# Patient Record
Sex: Female | Born: 1955 | Race: Black or African American | Hispanic: No | Marital: Married | Smoking: Never smoker
Health system: Southern US, Community
[De-identification: ages and names within clinical notes are randomized; demographics above are authoritative.]

## PROBLEM LIST (undated history)

## (undated) DIAGNOSIS — E039 Hypothyroidism, unspecified: Secondary | ICD-10-CM

## (undated) DIAGNOSIS — D649 Anemia, unspecified: Secondary | ICD-10-CM

## (undated) DIAGNOSIS — N390 Urinary tract infection, site not specified: Secondary | ICD-10-CM

## (undated) DIAGNOSIS — E079 Disorder of thyroid, unspecified: Secondary | ICD-10-CM

## (undated) DIAGNOSIS — I10 Essential (primary) hypertension: Secondary | ICD-10-CM

## (undated) DIAGNOSIS — M1990 Unspecified osteoarthritis, unspecified site: Secondary | ICD-10-CM

## (undated) HISTORY — DX: Urinary tract infection, site not specified: N39.0

## (undated) HISTORY — DX: Disorder of thyroid, unspecified: E07.9

## (undated) HISTORY — DX: Anemia, unspecified: D64.9

## (undated) HISTORY — DX: Unspecified osteoarthritis, unspecified site: M19.90

## (undated) HISTORY — PX: PARTIAL HYSTERECTOMY: SHX80

## (undated) HISTORY — DX: Essential (primary) hypertension: I10

## (undated) HISTORY — PX: COLONOSCOPY: SHX174

---

## 1998-10-11 ENCOUNTER — Emergency Department (HOSPITAL_COMMUNITY): Admission: EM | Admit: 1998-10-11 | Discharge: 1998-10-11 | Payer: Self-pay | Admitting: *Deleted

## 2000-06-24 ENCOUNTER — Emergency Department (HOSPITAL_COMMUNITY): Admission: EM | Admit: 2000-06-24 | Discharge: 2000-06-25 | Payer: Self-pay | Admitting: *Deleted

## 2000-11-03 ENCOUNTER — Encounter (INDEPENDENT_AMBULATORY_CARE_PROVIDER_SITE_OTHER): Payer: Self-pay | Admitting: *Deleted

## 2000-11-03 LAB — CONVERTED CEMR LAB

## 2001-04-11 ENCOUNTER — Encounter: Admission: RE | Admit: 2001-04-11 | Discharge: 2001-04-11 | Payer: Self-pay | Admitting: Family Medicine

## 2002-07-20 ENCOUNTER — Encounter (INDEPENDENT_AMBULATORY_CARE_PROVIDER_SITE_OTHER): Payer: Self-pay | Admitting: Specialist

## 2002-07-20 ENCOUNTER — Ambulatory Visit (HOSPITAL_COMMUNITY): Admission: RE | Admit: 2002-07-20 | Discharge: 2002-07-20 | Payer: Self-pay | Admitting: Gastroenterology

## 2005-08-18 ENCOUNTER — Encounter: Admission: RE | Admit: 2005-08-18 | Discharge: 2005-08-18 | Payer: Self-pay | Admitting: Internal Medicine

## 2006-02-28 ENCOUNTER — Encounter: Admission: RE | Admit: 2006-02-28 | Discharge: 2006-02-28 | Payer: Self-pay | Admitting: Internal Medicine

## 2006-06-03 ENCOUNTER — Encounter (INDEPENDENT_AMBULATORY_CARE_PROVIDER_SITE_OTHER): Payer: Self-pay | Admitting: *Deleted

## 2007-08-20 ENCOUNTER — Emergency Department (HOSPITAL_COMMUNITY): Admission: EM | Admit: 2007-08-20 | Discharge: 2007-08-20 | Payer: Self-pay | Admitting: Emergency Medicine

## 2009-11-03 ENCOUNTER — Encounter: Admission: RE | Admit: 2009-11-03 | Discharge: 2009-11-03 | Payer: Self-pay | Admitting: Internal Medicine

## 2009-11-26 ENCOUNTER — Encounter: Admission: RE | Admit: 2009-11-26 | Discharge: 2009-11-26 | Payer: Self-pay | Admitting: Family Medicine

## 2009-11-28 ENCOUNTER — Emergency Department (HOSPITAL_COMMUNITY): Admission: EM | Admit: 2009-11-28 | Discharge: 2009-11-28 | Payer: Self-pay | Admitting: Emergency Medicine

## 2009-11-28 ENCOUNTER — Ambulatory Visit: Payer: Self-pay | Admitting: Vascular Surgery

## 2010-03-20 ENCOUNTER — Encounter
Admission: RE | Admit: 2010-03-20 | Discharge: 2010-03-20 | Payer: Self-pay | Source: Home / Self Care | Attending: Internal Medicine | Admitting: Internal Medicine

## 2010-04-02 ENCOUNTER — Encounter
Admission: RE | Admit: 2010-04-02 | Discharge: 2010-04-02 | Payer: Self-pay | Source: Home / Self Care | Attending: Internal Medicine | Admitting: Internal Medicine

## 2010-04-26 ENCOUNTER — Encounter: Payer: Self-pay | Admitting: Internal Medicine

## 2010-08-21 NOTE — Op Note (Signed)
Sylvia Conner, MADERA                           ACCOUNT NO.:  [id number]   MEDICAL RECORD NO.:  [id number]                   PATIENT TYPE:  AMB   LOCATION:  ENDO                                 FACILITY:  [facility]   PHYSICIAN:  Bernette Redbird, M.D.                DATE OF BIRTH:  15-Apr-1955   DATE OF PROCEDURE:  07/20/2002  DATE OF DISCHARGE:                                 OPERATIVE REPORT   PROCEDURE:  Colonoscopy with biopsies.   INDICATION:  This a 55 year old African-American female, who had heme-  positive stool in association with some small volume hematochezia and a  palpable nodule on digital exam in the distal rectum.   FINDINGS:  Probable prolapsed internal hemorrhoid.  Possible hypertrophied  anal papilla.  Diminutive colonic polyp.  Scattered diverticulosis.   DESCRIPTION OF PROCEDURE:  The nature, purpose, and risks of the procedure  had been discussed with the patient who provided written consent.  Sedation  was fentanyl 112.5 mcg and Versed 9 mg IV without arrhythmias or  desaturation.  The Olympus adjustable-tension pediatric video colonoscope  was advanced with slight difficulty through a somewhat angulated sigmoid  region and then with external abdominal compression to control looping, we  were able to reach the cecum, as identified by clear visualization of the  appendiceal orifice.  Pullback was then performed.  The quality of the prep  was quite good, and it is felt that all areas were adequately seen.   On the way in, at about 60 cm, felt to probably be the proximal descending  colon, I encountered a sessile 3 mm polyp removed by a couple of cold  biopsies.  No other polyps were seen, and there was no evidence of cancer,  colitis, or vascular ectasia.   There were some scattered diverticulosis around the colon.   Retroflexed viewing in the rectum showed a prolapsed, smooth-covered,  roughly 1 cm, pedunculated nodule, felt to probably be a prolapsed external  hemorrhoid, although it could also be a hypertrophied anal papilla.  No  evidence of cancer or colonic polyps was seen during this exam.   The patient tolerated this procedure well, and there were no apparent  complications.   IMPRESSION:  1. Diminutive colonic polyp removed by cold biopsy, as described above.  2. Palpable lesion in the distal rectum corresponds to what appears to be a     hypertrophied anal papilla or perhaps an atypical internally prolapsed     external hemorrhoid.  It clearly is originating from distal to the     dentate line.  3. Scattered diverticulosis.    PLAN:  1. Await pathology on today's biopsies of the polyp.  2. No specific treatment for the rectal nodule is felt to be needed but if     desired, surgical excision under local anesthesia could probably be     accomplished.  Bernette Redbird, M.D.    RB/MEDQ  D:  07/20/2002  T:  07/20/2002  Job:  [id number]   cc:   Antony Madura, M.D.  [address], [address]  [place]  [place] [address]  Fax: [phone]

## 2010-12-30 LAB — POCT I-STAT, CHEM 8
BUN: 16
Calcium, Ion: 1.13
Creatinine, Ser: 1.1
Glucose, Bld: 134 — ABNORMAL HIGH
HCT: 39
Hemoglobin: 13.3
TCO2: 28

## 2010-12-30 LAB — DIFFERENTIAL
Basophils Absolute: 0
Basophils Relative: 1
Lymphocytes Relative: 38
Monocytes Relative: 6
Neutro Abs: 2.8
Neutrophils Relative %: 53

## 2010-12-30 LAB — CBC
Hemoglobin: 11.5 — ABNORMAL LOW
RBC: 4.67

## 2011-05-20 ENCOUNTER — Encounter: Payer: Self-pay | Admitting: Internal Medicine

## 2011-05-24 ENCOUNTER — Encounter: Payer: Self-pay | Admitting: Internal Medicine

## 2011-05-24 ENCOUNTER — Other Ambulatory Visit (INDEPENDENT_AMBULATORY_CARE_PROVIDER_SITE_OTHER): Payer: PRIVATE HEALTH INSURANCE

## 2011-05-24 ENCOUNTER — Ambulatory Visit (INDEPENDENT_AMBULATORY_CARE_PROVIDER_SITE_OTHER): Payer: PRIVATE HEALTH INSURANCE | Admitting: Internal Medicine

## 2011-05-24 DIAGNOSIS — R141 Gas pain: Secondary | ICD-10-CM

## 2011-05-24 DIAGNOSIS — R1032 Left lower quadrant pain: Secondary | ICD-10-CM

## 2011-05-24 DIAGNOSIS — Z139 Encounter for screening, unspecified: Secondary | ICD-10-CM

## 2011-05-24 DIAGNOSIS — R143 Flatulence: Secondary | ICD-10-CM

## 2011-05-24 DIAGNOSIS — R109 Unspecified abdominal pain: Secondary | ICD-10-CM

## 2011-05-24 DIAGNOSIS — R142 Eructation: Secondary | ICD-10-CM

## 2011-05-24 DIAGNOSIS — E039 Hypothyroidism, unspecified: Secondary | ICD-10-CM | POA: Insufficient documentation

## 2011-05-24 DIAGNOSIS — Z90710 Acquired absence of both cervix and uterus: Secondary | ICD-10-CM | POA: Insufficient documentation

## 2011-05-24 DIAGNOSIS — I10 Essential (primary) hypertension: Secondary | ICD-10-CM | POA: Insufficient documentation

## 2011-05-24 DIAGNOSIS — E119 Type 2 diabetes mellitus without complications: Secondary | ICD-10-CM | POA: Insufficient documentation

## 2011-05-24 DIAGNOSIS — R140 Abdominal distension (gaseous): Secondary | ICD-10-CM

## 2011-05-24 LAB — CBC WITH DIFFERENTIAL/PLATELET
Basophils Absolute: 0.1 10*3/uL (ref 0.0–0.1)
Basophils Relative: 0.9 % (ref 0.0–3.0)
HCT: 36.7 % (ref 36.0–46.0)
Lymphs Abs: 2.1 10*3/uL (ref 0.7–4.0)
Monocytes Absolute: 0.3 10*3/uL (ref 0.1–1.0)
Neutro Abs: 3.2 10*3/uL (ref 1.4–7.7)
RBC: 4.8 Mil/uL (ref 3.87–5.11)
WBC: 5.8 10*3/uL (ref 4.5–10.5)

## 2011-05-24 LAB — COMPREHENSIVE METABOLIC PANEL
AST: 21 U/L (ref 0–37)
Albumin: 4 g/dL (ref 3.5–5.2)
Alkaline Phosphatase: 72 U/L (ref 39–117)
Calcium: 9.2 mg/dL (ref 8.4–10.5)
Potassium: 3.8 mEq/L (ref 3.5–5.1)
Sodium: 135 mEq/L (ref 135–145)

## 2011-05-24 LAB — URINALYSIS WITH CULTURE, IF INDICATED
Ketones, ur: NEGATIVE
Leukocytes, UA: NEGATIVE

## 2011-05-24 MED ORDER — HYOSCYAMINE SULFATE 0.125 MG SL SUBL: 0.1250 mg | SUBLINGUAL_TABLET | SUBLINGUAL | Status: AC | PRN

## 2011-05-24 MED ORDER — PEG-KCL-NACL-NASULF-NA ASC-C 100 G PO SOLR: 1.0000 | Freq: Once | ORAL | Status: DC

## 2011-05-24 MED ORDER — ALIGN 4 MG PO CAPS: 1.0000 | ORAL_CAPSULE | Freq: Every day | ORAL | Status: DC

## 2011-05-24 NOTE — Progress Notes (Signed)
Subjective:    Patient ID: Sylvia Conner, female    DOB: 13-Jan-1956, 56 y.o.   MRN: [id number]  HPI Sylvia Conner is a 56 yo female with PMH of HTN, DM who is seen for evaluation of LLQ abdominal pain and abdominal bloating.  The patient reports ongoing left lower corner pain over the past month. She reports this pain is worse in the morning, and is usually present when she wakes up.  The pain does seem to get better slightly over the course of the day. She does not associate this with eating or bowel movement necessarily. She reports her bowel movements are now formed, occurring once every other day. This is normal for her, however 2 weeks ago she reports 2 days of significant diarrhea. She reports having seen her PCP around this time and was felt to have a viral enteritis. She denies blood in her stool and melena. No nausea or vomiting. No heartburn. No dysphagia. Appetite has been good, but she does note a fluctuating weight. She reports about a 10 pound weight loss however she's gained back most of his weight at this point. She denies fevers but does note some chills and occasional night sweats. She reports significant abdominal bloating and "heaviness". This also is been going on for about a month and is worse after eating. She denies dysuria or hematuria, but notes on one occasion in the past week seeing what appeared to be pink tinged fluid on wiping. This was after urinating only. She reports a history of hysterectomy and reports having not seen any blood per vagina in many years. She does report that her ovaries are intact.  She also reports a colonoscopy  "7 or 8 years ago" 4 history rectal bleeding. She reports being diagnosed with hemorrhoids and skin tags. She also recalls one polyp removed but she does not know the nature of this polyp.  Review of Systems Constitutional: See HPI HEENT: Negative for sore throat, mouth sores and trouble swallowing. Eyes: Negative for visual  disturbance Respiratory: Negative for cough, chest tightness and shortness of breath Cardiovascular: Negative for chest pain, palpitations and lower extremity swelling Gastrointestinal: See history of present illness Genitourinary: Negative for dysuria and hematuria. Musculoskeletal: Negative for back pain, arthralgias and myalgias Skin: Negative for rash or color change Neurological: Positive for headaches, negative for weakness, numbness Hematological: Negative for adenopathy, negative for easy bruising/bleeding Psychiatric/behavioral: Negative for depressed mood, negative for anxiety   Past Medical History  Diagnosis Date  . Arthritis   . Diabetes mellitus   . Hypertension   . UTI (lower urinary tract infection)    Past Surgical History  Procedure Date  . Partial hysterectomy     1995   Family History  Problem Relation Age of Onset  . Diabetes    --Neg for CRC  Social History  . Marital Status: Married   Social History Main Topics  . Smoking status: Never Smoker   . Smokeless tobacco: Never Used  . Alcohol Use: No  . Drug Use: No      Objective:   Physical Exam BP 104/74  Pulse 66  Ht 5\' 3"  (1.6 m)  Wt 169 lb (76.658 kg)  BMI 29.94 kg/m2 Constitutional: Well-developed and well-nourished. No distress. HEENT: Normocephalic and atraumatic. Oropharynx is clear and moist. No oropharyngeal exudate. Conjunctivae are normal. Pupils are equal round and reactive to light. No scleral icterus. Neck: Neck supple. Trachea midline. Cardiovascular: Normal rate, regular rhythm and intact distal pulses. No M/R/G Pulmonary/chest:  Effort normal and breath sounds normal. No wheezing, rales or rhonchi. Abdominal: Soft, very mild left lower quadrant tenderness to deep palpation without rebound or guarding, nondistended. Bowel sounds active throughout. There are no masses palpable. No hepatosplenomegaly. Extremities: no clubbing, cyanosis, or edema Lymphadenopathy: No cervical  adenopathy noted. Neurological: Alert and oriented to person place and time. Skin: Skin is warm and dry. No rashes noted. Psychiatric: Normal mood and affect. Behavior is normal.  COMPLETE ABDOMINAL ULTRASOUND 11/03/2009    Comparison:  None.    Findings:    Gallbladder:  No gallstones are seen.  There is no pain over the gallbladder.  There is a small hypoechoic structure either within the gallbladder wall or within the adjacent liver of 5 x 4 x 6 mm which has been benign ultrasound characteristics.    Common bile duct:  The common bile duct is normal measuring 3.3 mm in diameter.    Liver:  The liver is diffusely echogenic consistent with fatty infiltration.  No focal abnormality is seen other than the area questioned adjacent to the gallbladder as noted above.    IVC:  Appears normal.    Pancreas:  No focal abnormality seen.    Spleen:  The spleen is not well seen but measures 2.8 cm sagittally.    Right Kidney:  No hydronephrosis is seen.  The right kidney measures 10.3 cm sagittally.    Left Kidney:  The left kidney is partially obscured by bowel gas. No hydronephrosis is seen.  The left kidney measures 10.8 cm.    Abdominal aorta:  The abdominal aorta is normal in caliber.    IMPRESSION:    1.  Fatty infiltration of the liver. 2.  No gallstones. 3.  No hydronephrosis.  The left kidney is partially obscured by bowel gas.     Assessment & Plan:  56 yo female with PMH of HTN, DM who is seen for evaluation of LLQ abdominal pain and abdominal bloating  1. LLQ pain/abd bloating -- at this point etiology the patient's symptoms is unclear. It does seem possible that she had a viral gastroenteritis 2 weeks ago manifested by diarrhea. 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 We will perform abdominal/pelvic CT scan with contrast to better evaluate her abdominal pain. If this is  unremarkable we will proceed with colonoscopy. Colonoscopy will be performed either way, for screening purposes and a history of polyp of unknown nature.  Given her reported history of possible blood in her urine, I will perform a UA with culture.  Also CMP and CBC.  Finally I will give her an anti-bloating diet to try to help with her symptoms, and I have recommended Align one capsule daily. A prescription is given for Levsin to be used as needed and as directed for pain/spasm.  Followup will be after imaging and procedure

## 2011-05-24 NOTE — Patient Instructions (Signed)
You have been scheduled for a colonoscopy with propofol. Please follow written instructions given to you at your visit today.  Please pick up your prep kit at the pharmacy within the next 1-3 days.  You have been scheduled for a CT scan of the abdomen and pelvis at [facility] ([address]---this is in the same building as [facility]ural [facility]).   You are scheduled on 05/25/2011 at 9:00am. You should arrive 15 minutes prior to your appointment time for registration. Please follow the written instructions below on the day of your exam:  WARNING: IF YOU ARE ALLERGIC TO IODINE/X-RAY DYE, PLEASE NOTIFY RADIOLOGY IMMEDIATELY AT [phone]! YOU WILL BE GIVEN A 13 HOUR PREMEDICATION PREP.  1) Do not eat or drink anything after 5:00am (4 hours prior to your test) 2) You have been given 2 bottles of oral contrast to drink. The solution may taste better if refrigerated, but do NOT add ice or any other liquid to this solution. Shake well before drinking.    Drink 1 bottle of contrast @ 7:00am (2 hours prior to your exam)  Drink 1 bottle of contrast @ 8:00am (1 hour prior to your exam)  You may take any medications as prescribed with a small amount of water except for the following: Metformin, Glucophage, Glucovance, Avandamet, Riomet, Fortamet, Actoplus Met, Janumet, Glumetza or Metaglip. The above medications must be held the day of the exam AND 48 hours after the exam.  The purpose of you drinking the oral contrast is to aid in the visualization of your intestinal tract. The contrast solution may cause some diarrhea. Before your exam is started, you will be given a small amount of fluid to drink. Depending on your individual set of symptoms, you may also receive an intravenous injection of x-ray contrast/dye. Plan on being at [facility] for 30 minutes or long, depending on the type of exam you are having performed.  If you have any questions regarding your exam or if you need to  reschedule, you may call the CT department at [phone] between the hours of 8:00 am and 5:00 pm, Monday-Friday.  Your physician has requested that you go to the basement for lab work before leaving today  We have sent the following medications to your pharmacy for you to pick up at your convenience:levsin, align take as directed  You have been given an anti-bloating diet       ________________________________________________________________________  2

## 2011-05-25 ENCOUNTER — Telehealth: Payer: Self-pay | Admitting: *Deleted

## 2011-05-25 ENCOUNTER — Ambulatory Visit (INDEPENDENT_AMBULATORY_CARE_PROVIDER_SITE_OTHER)
Admission: RE | Admit: 2011-05-25 | Discharge: 2011-05-25 | Disposition: A | Discharge status: Home / Self Care | Payer: PRIVATE HEALTH INSURANCE | Source: Ambulatory Visit | Attending: Internal Medicine | Admitting: Internal Medicine

## 2011-05-25 ENCOUNTER — Ambulatory Visit (INDEPENDENT_AMBULATORY_CARE_PROVIDER_SITE_OTHER): Payer: PRIVATE HEALTH INSURANCE | Admitting: Family Medicine

## 2011-05-25 VITALS — BP 119/76 | HR 79 | Temp 99.1°F | Resp 16 | Ht 63.0 in | Wt 164.0 lb

## 2011-05-25 DIAGNOSIS — R319 Hematuria, unspecified: Secondary | ICD-10-CM

## 2011-05-25 DIAGNOSIS — R109 Unspecified abdominal pain: Secondary | ICD-10-CM

## 2011-05-25 DIAGNOSIS — Z139 Encounter for screening, unspecified: Secondary | ICD-10-CM

## 2011-05-25 DIAGNOSIS — R300 Dysuria: Secondary | ICD-10-CM

## 2011-05-25 LAB — POCT UA - MICROSCOPIC ONLY
Casts, Ur, LPF, POC: NEGATIVE
Crystals, Ur, HPF, POC: NEGATIVE
Yeast, UA: NEGATIVE

## 2011-05-25 LAB — POCT URINALYSIS DIPSTICK
Bilirubin, UA: NEGATIVE
Glucose, UA: NEGATIVE
Leukocytes, UA: NEGATIVE
Spec Grav, UA: 1.02

## 2011-05-25 MED ORDER — CIPROFLOXACIN HCL 500 MG PO TABS: 500.0000 mg | ORAL_TABLET | Freq: Two times a day (BID) | ORAL | Status: DC

## 2011-05-25 MED ORDER — IOHEXOL 300 MG/ML  SOLN
80.0000 mL | Freq: Once | INTRAMUSCULAR | Status: AC | PRN
  Administered 2011-05-25: 100 mL via INTRAVENOUS

## 2011-05-25 NOTE — Telephone Encounter (Signed)
Received a call from Erskine Squibb at [facility] who wanted to fax results of CT scan to [place]. Showed report to Dr Rhea Belton who had already seen the results and tried to call pt with no luck. I tried cell # which belonged to someone else; she was off from work today, so I left word on her answering machine. Pt has a cyst like component on her r ovary that needs probably a transvaginal U/S; Dr Rhea Belton would like her GYN to order and f/u with. Her Left side is ok, and it;s possible the r side can be causing her LLQ pain.

## 2011-05-25 NOTE — Progress Notes (Signed)
Subjective:    Patient ID: Sylvia Conner, female    DOB: Jul 07, 1955, 56 y.o.   MRN: [id number]  Hematuria The current episode started in the past 7 days. The problem has been gradually worsening since onset. She describes the hematuria as gross hematuria. The hematuria occurs throughout her entire urinary stream. She reports no clotting in her urine stream. Her pain is at a severity of 3/10. The pain is mild. She describes her urine color as light pink. Irritative symptoms include frequency. Irritative symptoms do not include urgency. Associated symptoms include chills, dysuria and flank pain. Pertinent negatives include no fever or inability to urinate. There is no history of kidney stones.  Dysuria  Associated symptoms include chills, flank pain, frequency and hematuria. Pertinent negatives include no urgency. There is no history of kidney stones.   Works at [employer]   Review of Systems  Constitutional: Positive for chills. Negative for fever.  Genitourinary: Positive for dysuria, frequency, hematuria and flank pain. Negative for urgency.       Objective:   Physical Exam  No distress No CVAT, No abd tenderness  Results for orders placed in visit on 05/24/11  CBC WITH DIFFERENTIAL      Component Value Range   WBC 5.8  4.5 - 10.5 (K/uL)   RBC 4.80  3.87 - 5.11 (Mil/uL)   Hemoglobin 11.9 (*) 12.0 - 15.0 (g/dL)   HCT 40.9  81.1 - 91.4 (%)   MCV 76.5 (*) 78.0 - 100.0 (fl)   MCHC 32.4  30.0 - 36.0 (g/dL)   RDW 78.2  95.6 - 21.3 (%)   Platelets 287.0  150.0 - 400.0 (K/uL)   Neutrophils Relative 55.4  43.0 - 77.0 (%)   Lymphocytes Relative 36.0  12.0 - 46.0 (%)   Monocytes Relative 6.0  3.0 - 12.0 (%)   Eosinophils Relative 1.7  0.0 - 5.0 (%)   Basophils Relative 0.9  0.0 - 3.0 (%)   Neutro Abs 3.2  1.4 - 7.7 (K/uL)   Lymphs Abs 2.1  0.7 - 4.0 (K/uL)   Monocytes Absolute 0.3  0.1 - 1.0 (K/uL)   Eosinophils Absolute 0.1  0.0 - 0.7 (K/uL)   Basophils Absolute 0.1   0.0 - 0.1 (K/uL)  COMPREHENSIVE METABOLIC PANEL      Component Value Range   Sodium 135  135 - 145 (mEq/L)   Potassium 3.8  3.5 - 5.1 (mEq/L)   Chloride 101  96 - 112 (mEq/L)   CO2 28  19 - 32 (mEq/L)   Glucose, Bld 135 (*) 70 - 99 (mg/dL)   BUN 11  6 - 23 (mg/dL)   Creatinine, Ser 0.7  0.4 - 1.2 (mg/dL)   Total Bilirubin 0.9  0.3 - 1.2 (mg/dL)   Alkaline Phosphatase 72  39 - 117 (U/L)   AST 21  0 - 37 (U/L)   ALT 14  0 - 35 (U/L)   Total Protein 7.7  6.0 - 8.3 (g/dL)   Albumin 4.0  3.5 - 5.2 (g/dL)   Calcium 9.2  8.4 - 08.6 (mg/dL)   GFR 578.46  >96.29 (mL/min)  URINALYSIS WITH CULTURE, IF INDICATED      Component Value Range   Ketones, ur NEGATIVE  Negative    Leukocytes, UA NEGATIVE  Negative    Nitrite NEGATIVE  Negative    pH 6.5  5.0 - 8.0    Specific Gravity, Urine 1.010  1.000-1.030    Urine Glucose 250  Negative  Total Protein, Urine NEGATIVE  Negative    Urobilinogen, UA 0.2  0.0 - 1.0       Assessment & Plan:  Hematuria c/w UTI   Plan:   Check urine culture Cipro 500 mg bid x 3 days

## 2011-05-25 NOTE — Telephone Encounter (Signed)
Notified pt she needs an appt with GYN . Pt reports she hasn't had a PAP smear in about 3 years. She was OK with me scheduling her. Scheduled pt with Dr Leda Quail on 06/09/11 at 0915am. She will f/u with Dr Rhea Belton and her COLON on 05/31/11. Faxed info to [phone].

## 2011-05-27 LAB — URINE CULTURE

## 2011-05-31 ENCOUNTER — Ambulatory Visit (AMBULATORY_SURGERY_CENTER): Payer: PRIVATE HEALTH INSURANCE | Admitting: Internal Medicine

## 2011-05-31 ENCOUNTER — Encounter: Payer: Self-pay | Admitting: Internal Medicine

## 2011-05-31 VITALS — BP 139/78 | HR 63 | Temp 97.1°F | Resp 18 | Ht 63.0 in | Wt 169.0 lb

## 2011-05-31 DIAGNOSIS — D128 Benign neoplasm of rectum: Secondary | ICD-10-CM

## 2011-05-31 DIAGNOSIS — Z1211 Encounter for screening for malignant neoplasm of colon: Secondary | ICD-10-CM

## 2011-05-31 DIAGNOSIS — D129 Benign neoplasm of anus and anal canal: Secondary | ICD-10-CM

## 2011-05-31 DIAGNOSIS — D126 Benign neoplasm of colon, unspecified: Secondary | ICD-10-CM

## 2011-05-31 MED ORDER — SODIUM CHLORIDE 0.9 % IV SOLN: 500.0000 mL | INTRAVENOUS | Status: DC

## 2011-05-31 NOTE — Op Note (Signed)
[facility] [address] [place], [place]  [address]  COLONOSCOPY PROCEDURE REPORT  PATIENT:  Sylvia, Conner  MR#:  [id number] BIRTHDATE:  05-21-1955, 55 yrs. old  GENDER:  female ENDOSCOPIST:  Carie Caddy. Soloman Mckeithan, MD  PROCEDURE DATE:  05/31/2011 PROCEDURE:  Colonoscopy with snare polypectomy ASA CLASS:  Class II INDICATIONS:  Abdominal pain, Screening, 1st colonoscopy MEDICATIONS:   These medications were titrated to patient response per physician's verbal order, Versed 10 mg IV, Fentanyl 100 mcg IV  DESCRIPTION OF PROCEDURE:   After the risks benefits and alternatives of the procedure were thoroughly explained, informed consent was obtained.  Digital rectal exam was performed and revealed no rectal masses and perianal skin tags.   The LB [id number]-[id number] endoscope was introduced through the anus and advanced to the cecum, which was identified by both the appendix and ileocecal valve, without limitations.  The quality of the prep was Moviprep fair.  The instrument was then slowly withdrawn as the colon was fully examined. <<PROCEDUREIMAGES>> FINDINGS:  A 5 mm sessile polyp was found in the cecum. Polyp was snared without cautery. Retrieval was successful.  Mild diverticulosis was found  scattered throughout the colon. Retroflexed views in the rectum revealed no abnormalities.   The scope was then withdrawn from the cecum and the procedure completed.  COMPLICATIONS:  None  ENDOSCOPIC IMPRESSION: 1) Sessile polyp in the cecum. Removed and sent to pathology. 2) Mild diverticulosis found scattered throught the colon  RECOMMENDATIONS: 1) Await pathology results 2) High fiber diet. 3) If the polyp removed today is proven to be an adenomatous (pre-cancerous) polyp, you will need a repeat colonoscopy in 5 years. Otherwise you should continue to follow colorectal cancer screening guidelines for "routine risk" patients with colonoscopy in 10 years. You will receive a  letter within 1-2 weeks with the results of your biopsy as well as final recommendations. Please call my office if you have not received a letter after 3 weeks.  Carie Caddy. Rhea Belton, MD  CC:  Burton Apley, MD The Patient  n. eSIGNED:   Carie Caddy. Greidy Sherard at 05/31/2011 02:02 PM  [place], [place], [phone]

## 2011-05-31 NOTE — Progress Notes (Signed)
Patient did not experience any of the following events: a burn prior to discharge; a fall within the facility; wrong site/side/patient/procedure/implant event; or a hospital transfer or hospital admission upon discharge from the facility. (G8907) Patient did not have preoperative order for IV antibiotic SSI prophylaxis. (G8918)  

## 2011-05-31 NOTE — Patient Instructions (Addendum)
YOU HAD AN ENDOSCOPIC PROCEDURE TODAY AT THE [facility]: Refer to the procedure report that was given to you for any specific questions about what was found during the examination.  If the procedure report does not answer your questions, please call your gastroenterologist to clarify.  If you requested that your care partner not be given the details of your procedure findings, then the procedure report has been included in a sealed envelope for you to review at your convenience later.  YOU SHOULD EXPECT: Some feelings of bloating in the abdomen. Passage of more gas than usual.  Walking can help get rid of the air that was put into your GI tract during the procedure and reduce the bloating. If you had a lower endoscopy (such as a colonoscopy or flexible sigmoidoscopy) you may notice spotting of blood in your stool or on the toilet paper. If you underwent a bowel prep for your procedure, then you may not have a normal bowel movement for a few days.  DIET: Your first meal following the procedure should be a light meal and then it is ok to progress to your normal diet.  A half-sandwich or bowl of soup is an example of a good first meal.  Heavy or fried foods are harder to digest and may make you feel nauseous or bloated.  Likewise meals heavy in dairy and vegetables can cause extra gas to form and this can also increase the bloating.  Drink plenty of fluids but you should avoid alcoholic beverages for 24 hours.  ACTIVITY: Your care partner should take you home directly after the procedure.  You should plan to take it easy, moving slowly for the rest of the day.  You can resume normal activity the day after the procedure however you should NOT DRIVE or use heavy machinery for 24 hours (because of the sedation medicines used during the test).    SYMPTOMS TO REPORT IMMEDIATELY: A gastroenterologist can be reached at any hour.  During normal business hours, 8:30 AM to 5:00 PM Monday through Friday,  call [phone].  After hours and on weekends, please call the GI answering service at [phone] who will take a message and have the physician on call contact you.   Following lower endoscopy (colonoscopy or flexible sigmoidoscopy):  Excessive amounts of blood in the stool  Significant tenderness or worsening of abdominal pains  Swelling of the abdomen that is new, acute  Fever of 100F or higher    FOLLOW UP: If any biopsies were taken you will be contacted by phone or by letter within the next 1-3 weeks.  Call your gastroenterologist if you have not heard about the biopsies in 3 weeks.  Our staff will call the home number listed on your records the next business day following your procedure to check on you and address any questions or concerns that you may have at that time regarding the information given to you following your procedure. This is a courtesy call and so if there is no answer at the home number and we have not heard from you through the emergency physician on call, we will assume that you have returned to your regular daily activities without incident.  SIGNATURES/CONFIDENTIALITY: You and/or your care partner have signed paperwork which will be entered into your electronic medical record.  These signatures attest to the fact that that the information above on your After Visit Summary has been reviewed and is understood.  Full responsibility of the confidentiality   of this discharge information lies with you and/or your care-partner.     

## 2011-06-01 ENCOUNTER — Telehealth: Payer: Self-pay | Admitting: *Deleted

## 2011-06-01 NOTE — Telephone Encounter (Signed)
  Follow up Call-  Call back number 05/31/2011  Post procedure Call Back phone  # [phone] ASK FOR PT. AND THEY PAGE HER  Permission to leave phone message No     No answer, do not have permission to leave message.

## 2011-06-07 ENCOUNTER — Encounter: Payer: Self-pay | Admitting: Internal Medicine

## 2011-08-17 ENCOUNTER — Telehealth: Payer: Self-pay | Admitting: Gastroenterology

## 2011-08-17 NOTE — Telephone Encounter (Signed)
lvm for pt to call us back regarding her No-showing her GYN appointment 3x

## 2012-05-26 ENCOUNTER — Ambulatory Visit: Payer: Self-pay

## 2012-10-03 ENCOUNTER — Other Ambulatory Visit: Payer: Self-pay

## 2012-10-03 DIAGNOSIS — Z1231 Encounter for screening mammogram for malignant neoplasm of breast: Secondary | ICD-10-CM

## 2012-10-09 ENCOUNTER — Emergency Department (HOSPITAL_COMMUNITY)
Admission: EM | Admit: 2012-10-09 | Discharge: 2012-10-09 | Disposition: A | Discharge status: Home / Self Care | Payer: PRIVATE HEALTH INSURANCE | Attending: Emergency Medicine | Admitting: Emergency Medicine

## 2012-10-09 ENCOUNTER — Encounter (HOSPITAL_COMMUNITY): Payer: Self-pay | Admitting: Emergency Medicine

## 2012-10-09 ENCOUNTER — Emergency Department (HOSPITAL_COMMUNITY): Payer: PRIVATE HEALTH INSURANCE

## 2012-10-09 DIAGNOSIS — R079 Chest pain, unspecified: Secondary | ICD-10-CM

## 2012-10-09 DIAGNOSIS — R002 Palpitations: Secondary | ICD-10-CM | POA: Insufficient documentation

## 2012-10-09 DIAGNOSIS — Z9889 Other specified postprocedural states: Secondary | ICD-10-CM | POA: Insufficient documentation

## 2012-10-09 DIAGNOSIS — I10 Essential (primary) hypertension: Secondary | ICD-10-CM | POA: Insufficient documentation

## 2012-10-09 DIAGNOSIS — R072 Precordial pain: Secondary | ICD-10-CM | POA: Insufficient documentation

## 2012-10-09 DIAGNOSIS — E079 Disorder of thyroid, unspecified: Secondary | ICD-10-CM | POA: Insufficient documentation

## 2012-10-09 DIAGNOSIS — Z862 Personal history of diseases of the blood and blood-forming organs and certain disorders involving the immune mechanism: Secondary | ICD-10-CM | POA: Insufficient documentation

## 2012-10-09 DIAGNOSIS — Z90710 Acquired absence of both cervix and uterus: Secondary | ICD-10-CM | POA: Insufficient documentation

## 2012-10-09 DIAGNOSIS — Z7982 Long term (current) use of aspirin: Secondary | ICD-10-CM | POA: Insufficient documentation

## 2012-10-09 DIAGNOSIS — M79609 Pain in unspecified limb: Secondary | ICD-10-CM | POA: Insufficient documentation

## 2012-10-09 DIAGNOSIS — E119 Type 2 diabetes mellitus without complications: Secondary | ICD-10-CM | POA: Insufficient documentation

## 2012-10-09 DIAGNOSIS — Z880 Allergy status to penicillin: Secondary | ICD-10-CM | POA: Insufficient documentation

## 2012-10-09 DIAGNOSIS — M129 Arthropathy, unspecified: Secondary | ICD-10-CM | POA: Insufficient documentation

## 2012-10-09 DIAGNOSIS — Z87440 Personal history of urinary (tract) infections: Secondary | ICD-10-CM | POA: Insufficient documentation

## 2012-10-09 DIAGNOSIS — Z79899 Other long term (current) drug therapy: Secondary | ICD-10-CM | POA: Insufficient documentation

## 2012-10-09 DIAGNOSIS — M549 Dorsalgia, unspecified: Secondary | ICD-10-CM | POA: Insufficient documentation

## 2012-10-09 LAB — COMPREHENSIVE METABOLIC PANEL
ALT: 44 U/L — ABNORMAL HIGH (ref 0–35)
Alkaline Phosphatase: 115 U/L (ref 39–117)
BUN: 14 mg/dL (ref 6–23)
CO2: 30 mEq/L (ref 19–32)
GFR calc Af Amer: >90 mL/min (ref >90)
GFR calc non Af Amer: >90 mL/min (ref >90)
Glucose, Bld: 268 mg/dL — ABNORMAL HIGH (ref 70–99)
Potassium: 3.9 mEq/L (ref 3.5–5.1)
Sodium: 136 mEq/L (ref 135–145)

## 2012-10-09 LAB — CBC WITH DIFFERENTIAL/PLATELET
Basophils Relative: 1 % (ref 0–1)
Eosinophils Absolute: 0.1 10*3/uL (ref 0.0–0.7)
HCT: 34.6 % — ABNORMAL LOW (ref 36.0–46.0)
Hemoglobin: 11.2 g/dL — ABNORMAL LOW (ref 12.0–15.0)
Lymphocytes Relative: 45 % (ref 12–46)
Lymphs Abs: 2.4 10*3/uL (ref 0.7–4.0)
MCHC: 32.4 g/dL (ref 30.0–36.0)
MCV: 74.2 fL — ABNORMAL LOW (ref 78.0–100.0)
Monocytes Relative: 6 % (ref 3–12)
Neutro Abs: 2.5 10*3/uL (ref 1.7–7.7)

## 2012-10-09 MED ORDER — SODIUM CHLORIDE 0.9 % IV SOLN
INTRAVENOUS | Status: DC
  Administered 2012-10-09: 18:00:00 via INTRAVENOUS

## 2012-10-09 MED ORDER — SODIUM CHLORIDE 0.9 % IV BOLUS (SEPSIS)
1000.0000 mL | Freq: Once | INTRAVENOUS | Status: AC
  Administered 2012-10-09: 1000 mL via INTRAVENOUS

## 2012-10-09 NOTE — ED Provider Notes (Signed)
Medical screening examination/treatment/procedure(s) were conducted as a shared visit with non-physician practitioner(s) and myself.  I personally evaluated the patient during the encounter  The patient seen by me. Patient with one and a half weeks of intermittent chest pain chest pain day or more than not. Currently no pain. That pain today. Patient has risk factors of diabetes and hypertension. No history of any chest pain or cardiac problems prior to this point. Initial EKG in the ED showed the trigeminy. It is now resolved monitor has no evidence of any premature ventricular contractions. Patient had no complaint of palpitations. Patient does have a primary care doctor. Patient's chest x-rays negative troponins negative EKG as mentioned above. No evidence of acute cardiac event. With the duration of the chest pain and the frequency of the chest pain troponin should be positive this is unstable angina or an MI. That has been ruled out. Patient will referred to cardiology for followup and asked her primary care Dr. for further workup.  Shelda Jakes, MD 10/09/12 2013

## 2012-10-09 NOTE — ED Notes (Signed)
Pt states that she has chest pain that radiates down both arms and to her back that has been constant for week and half now.  Pt states she has some dizziness with it.

## 2012-10-09 NOTE — ED Provider Notes (Signed)
History    CSN: [id number] Arrival date & time 10/09/12  1709  First MD Initiated Contact with Patient 10/09/12 1715     Chief Complaint  Patient presents with  . Chest Pain   (Consider location/radiation/quality/duration/timing/severity/associated sxs/prior Treatment) HPI Sylvia Conner is a 57 y.o.female with a significant PMH of *arthritis, diabetes, hypertension, UTI, anemia, thyroid disease, partial hysterectomy, colonoscopy presents to the ER with complaints of chest pain, back pain and bilateral arm pain for the past 1 and a half weeks, she currently has no pain.  She denies ever having chest pains before. Does not have a cardiologist. Pain starts substernal than radiates from right shoulder to left then into back. No prior Stents, MIs, CABGs. Says she had a stress test done many years ago as a screening by her PCP. No associated signs of Nausea, vomiting, diarrhea, SOB, wheezing, coughing or dizziness.Took two aspirin today. Currently having no pain. EKG shows trigeminy    Past Medical History  Diagnosis Date  . Arthritis   . Diabetes mellitus   . Hypertension   . UTI (lower urinary tract infection)   . Anemia   . Thyroid disease    Past Surgical History  Procedure Laterality Date  . Partial hysterectomy      1995  . Colonoscopy     Family History  Problem Relation Age of Onset  . Diabetes    . Hypertension Mother   . Diabetes Mother   . Diabetes Sister   . Diabetes Paternal Grandmother    History  Substance Use Topics  . Smoking status: Never Smoker   . Smokeless tobacco: Never Used  . Alcohol Use: No   OB History   Grav Para Term Preterm Abortions TAB SAB Ect Mult Living                 Review of Systems  Cardiovascular: Positive for chest pain and palpitations.  All other systems reviewed and are negative.    Allergies  Penicillins and Scallops  Home Medications   Current Outpatient Rx  Name  Route  Sig  Dispense  Refill  .  aspirin 325 MG tablet   Oral   Take 650 mg by mouth every morning.         Marland Kitchen levothyroxine (SYNTHROID, LEVOTHROID) 125 MCG tablet   Oral   Take 125 mcg by mouth every morning.          . metFORMIN (GLUCOPHAGE) 500 MG tablet   Oral   Take 500 mg by mouth 2 (two) times daily with a meal.         . triamterene-hydrochlorothiazide (MAXZIDE) 75-50 MG per tablet   Oral   Take 1 tablet by mouth every morning.           BP 123/76  Pulse 69  Resp 16  SpO2 98% Physical Exam  Nursing note and vitals reviewed. Constitutional: She appears well-developed and well-nourished. No distress.  HENT:  Head: Normocephalic and atraumatic.  Eyes: Pupils are equal, round, and reactive to light.  Neck: Normal range of motion. Neck supple.  Cardiovascular: Normal rate, regular rhythm and normal heart sounds.   Pulmonary/Chest: Effort normal.  Abdominal: Soft.  Musculoskeletal:       Arms: Neurological: She is alert.  Skin: Skin is warm and dry.    ED Course  Procedures (including critical care time) Labs Reviewed  CBC WITH DIFFERENTIAL - Abnormal; Notable for the following:    Hemoglobin 11.2 (*)  HCT 34.6 (*)    MCV 74.2 (*)    MCH 24.0 (*)    All other components within normal limits  COMPREHENSIVE METABOLIC PANEL - Abnormal; Notable for the following:    Glucose, Bld 268 (*)    AST 39 (*)    ALT 44 (*)    All other components within normal limits  LIPASE, BLOOD  TROPONIN I  POCT I-STAT TROPONIN I   Dg Chest 2 View  10/09/2012   *RADIOLOGY REPORT*  Clinical Data: Chest pain, shortness of breath.  CHEST - 2 VIEW  Comparison: None.  Findings: Heart and mediastinal contours are within normal limits. No focal opacities or effusions.  No acute bony abnormality.  IMPRESSION: No active cardiopulmonary disease.   Original Report Authenticated By: Charlett Nose, M.D.   1. Chest pain     MDM   Date: 10/09/2012  Rate: 94  Rhythm: sinus tachycardia  QRS Axis: normal  Intervals:  normal  ST/T Wave abnormalities: normal  Conduction Disutrbances:first-degree A-V block   Narrative Interpretation:  Ventricular trigeminy  Old EKG Reviewed: changes noted pt now in ventricular trigeminy    Patient has stayed pain free through out visit. Troponin is negative. Dr. Deretha Emory has seen patient and feels that she does need to be evaluated by cardiology but as her pain has been for 1.5 weeks, troponin is negative and she has been pain free today that she can be evaluated as an outpatient. She has significant risk factors and we have expressed the importance of her f/u with her PCP and cardiology. Very strict return to ED precautions given.  57 y.o.Sylvia Conner's evaluation in the Emergency Department is complete. It has been determined that no acute conditions requiring further emergency intervention are present at this time. The patient/guardian have been advised of the diagnosis and plan. We have discussed signs and symptoms that warrant return to the ED, such as changes or worsening in symptoms.  Vital signs are stable at discharge. Filed Vitals:   10/09/12 1832  BP: 123/76  Pulse: 69  Resp: 16    Patient/guardian has voiced understanding and agreed to follow-up with the PCP or specialist.     Dorthula Matas, PA-C 10/09/12 2006

## 2012-10-25 ENCOUNTER — Ambulatory Visit
Admission: RE | Admit: 2012-10-25 | Discharge: 2012-10-25 | Disposition: A | Discharge status: Home / Self Care | Payer: PRIVATE HEALTH INSURANCE | Source: Ambulatory Visit

## 2012-10-25 DIAGNOSIS — Z1231 Encounter for screening mammogram for malignant neoplasm of breast: Secondary | ICD-10-CM

## 2012-10-31 ENCOUNTER — Ambulatory Visit: Payer: PRIVATE HEALTH INSURANCE

## 2012-11-26 ENCOUNTER — Emergency Department (HOSPITAL_COMMUNITY)
Admission: EM | Admit: 2012-11-26 | Discharge: 2012-11-26 | Disposition: A | Discharge status: Home / Self Care | Payer: PRIVATE HEALTH INSURANCE | Attending: Emergency Medicine | Admitting: Emergency Medicine

## 2012-11-26 ENCOUNTER — Emergency Department (HOSPITAL_COMMUNITY): Payer: PRIVATE HEALTH INSURANCE

## 2012-11-26 ENCOUNTER — Encounter (HOSPITAL_COMMUNITY): Payer: Self-pay | Admitting: Emergency Medicine

## 2012-11-26 DIAGNOSIS — E079 Disorder of thyroid, unspecified: Secondary | ICD-10-CM | POA: Insufficient documentation

## 2012-11-26 DIAGNOSIS — I10 Essential (primary) hypertension: Secondary | ICD-10-CM | POA: Insufficient documentation

## 2012-11-26 DIAGNOSIS — Z7982 Long term (current) use of aspirin: Secondary | ICD-10-CM | POA: Insufficient documentation

## 2012-11-26 DIAGNOSIS — Z880 Allergy status to penicillin: Secondary | ICD-10-CM | POA: Insufficient documentation

## 2012-11-26 DIAGNOSIS — Z79899 Other long term (current) drug therapy: Secondary | ICD-10-CM | POA: Insufficient documentation

## 2012-11-26 DIAGNOSIS — Z862 Personal history of diseases of the blood and blood-forming organs and certain disorders involving the immune mechanism: Secondary | ICD-10-CM | POA: Insufficient documentation

## 2012-11-26 DIAGNOSIS — M129 Arthropathy, unspecified: Secondary | ICD-10-CM | POA: Insufficient documentation

## 2012-11-26 DIAGNOSIS — IMO0002 Reserved for concepts with insufficient information to code with codable children: Secondary | ICD-10-CM

## 2012-11-26 DIAGNOSIS — Y92410 Unspecified street and highway as the place of occurrence of the external cause: Secondary | ICD-10-CM | POA: Insufficient documentation

## 2012-11-26 DIAGNOSIS — Y9389 Activity, other specified: Secondary | ICD-10-CM | POA: Insufficient documentation

## 2012-11-26 DIAGNOSIS — E119 Type 2 diabetes mellitus without complications: Secondary | ICD-10-CM | POA: Insufficient documentation

## 2012-11-26 DIAGNOSIS — Z87440 Personal history of urinary (tract) infections: Secondary | ICD-10-CM | POA: Insufficient documentation

## 2012-11-26 MED ORDER — CYCLOBENZAPRINE HCL 5 MG PO TABS: 5.0000 mg | ORAL_TABLET | Freq: Two times a day (BID) | ORAL | Status: DC | PRN

## 2012-11-26 MED ORDER — HYDROCODONE-ACETAMINOPHEN 5-325 MG PO TABS: 1.0000 | ORAL_TABLET | ORAL | Status: DC | PRN

## 2012-11-26 MED ORDER — CYCLOBENZAPRINE HCL 10 MG PO TABS
5.0000 mg | ORAL_TABLET | Freq: Once | ORAL | Status: AC
  Administered 2012-11-26: 5 mg via ORAL
  Filled 2012-11-26: qty 1
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
## 2012-11-26 NOTE — ED Notes (Signed)
Pt escorted to discharge window. Pt verbalized understanding discharge instructions. In no acute distress.  
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
## 2012-11-26 NOTE — ED Provider Notes (Signed)
Medical screening examination/treatment/procedure(s) were performed by non-physician practitioner and as supervising physician I was immediately available for consultation/collaboration.   Yue Flanigan, MD 11/26/12 1340 

## 2014-01-28 ENCOUNTER — Other Ambulatory Visit: Payer: Self-pay | Admitting: Internal Medicine

## 2014-01-28 DIAGNOSIS — Z1239 Encounter for other screening for malignant neoplasm of breast: Secondary | ICD-10-CM

## 2014-02-13 ENCOUNTER — Other Ambulatory Visit: Payer: Self-pay | Admitting: Internal Medicine

## 2014-02-13 DIAGNOSIS — Z1231 Encounter for screening mammogram for malignant neoplasm of breast: Secondary | ICD-10-CM

## 2014-02-14 ENCOUNTER — Ambulatory Visit: Payer: PRIVATE HEALTH INSURANCE

## 2014-04-29 ENCOUNTER — Other Ambulatory Visit: Payer: Self-pay | Admitting: Internal Medicine

## 2014-04-29 DIAGNOSIS — Z1231 Encounter for screening mammogram for malignant neoplasm of breast: Secondary | ICD-10-CM

## 2014-05-07 ENCOUNTER — Ambulatory Visit: Payer: PRIVATE HEALTH INSURANCE

## 2014-05-21 ENCOUNTER — Ambulatory Visit
Admission: RE | Admit: 2014-05-21 | Discharge: 2014-05-21 | Disposition: A | Discharge status: Home / Self Care | Payer: PRIVATE HEALTH INSURANCE | Source: Ambulatory Visit | Attending: Internal Medicine | Admitting: Internal Medicine

## 2014-05-21 DIAGNOSIS — Z1231 Encounter for screening mammogram for malignant neoplasm of breast: Secondary | ICD-10-CM

## 2015-05-05 ENCOUNTER — Other Ambulatory Visit: Payer: Self-pay

## 2015-05-05 DIAGNOSIS — Z1231 Encounter for screening mammogram for malignant neoplasm of breast: Secondary | ICD-10-CM

## 2015-05-26 ENCOUNTER — Ambulatory Visit: Payer: PRIVATE HEALTH INSURANCE

## 2015-06-16 ENCOUNTER — Ambulatory Visit
Admission: RE | Admit: 2015-06-16 | Discharge: 2015-06-16 | Disposition: A | Discharge status: Home / Self Care | Payer: Managed Care, Other (non HMO) | Source: Ambulatory Visit

## 2015-06-16 DIAGNOSIS — Z1231 Encounter for screening mammogram for malignant neoplasm of breast: Secondary | ICD-10-CM

## 2015-10-19 ENCOUNTER — Encounter (HOSPITAL_COMMUNITY): Payer: Self-pay | Admitting: *Deleted

## 2015-10-19 ENCOUNTER — Emergency Department (HOSPITAL_COMMUNITY)
Admission: EM | Admit: 2015-10-19 | Discharge: 2015-10-19 | Disposition: A | Discharge status: Home / Self Care | Payer: Self-pay | Attending: Emergency Medicine | Admitting: Emergency Medicine

## 2015-10-19 ENCOUNTER — Emergency Department (HOSPITAL_COMMUNITY): Payer: Self-pay

## 2015-10-19 DIAGNOSIS — N839 Noninflammatory disorder of ovary, fallopian tube and broad ligament, unspecified: Secondary | ICD-10-CM | POA: Insufficient documentation

## 2015-10-19 DIAGNOSIS — I10 Essential (primary) hypertension: Secondary | ICD-10-CM | POA: Insufficient documentation

## 2015-10-19 DIAGNOSIS — N838 Other noninflammatory disorders of ovary, fallopian tube and broad ligament: Secondary | ICD-10-CM

## 2015-10-19 DIAGNOSIS — R1032 Left lower quadrant pain: Secondary | ICD-10-CM

## 2015-10-19 DIAGNOSIS — E119 Type 2 diabetes mellitus without complications: Secondary | ICD-10-CM | POA: Insufficient documentation

## 2015-10-19 DIAGNOSIS — K5900 Constipation, unspecified: Secondary | ICD-10-CM

## 2015-10-19 DIAGNOSIS — M1990 Unspecified osteoarthritis, unspecified site: Secondary | ICD-10-CM | POA: Insufficient documentation

## 2015-10-19 DIAGNOSIS — Z7982 Long term (current) use of aspirin: Secondary | ICD-10-CM | POA: Insufficient documentation

## 2015-10-19 DIAGNOSIS — Z794 Long term (current) use of insulin: Secondary | ICD-10-CM | POA: Insufficient documentation

## 2015-10-19 DIAGNOSIS — Z79899 Other long term (current) drug therapy: Secondary | ICD-10-CM | POA: Insufficient documentation

## 2015-10-19 LAB — CBC WITH DIFFERENTIAL/PLATELET
BASOS ABS: 0 10*3/uL (ref 0.0–0.1)
Basophils Relative: 1 %
EOS ABS: 0.1 10*3/uL (ref 0.0–0.7)
Eosinophils Relative: 2 %
HCT: 36.9 % (ref 36.0–46.0)
Hemoglobin: 11.9 g/dL — ABNORMAL LOW (ref 12.0–15.0)
LYMPHS ABS: 1.7 10*3/uL (ref 0.7–4.0)
LYMPHS PCT: 38 %
MCH: 23.6 pg — ABNORMAL LOW (ref 26.0–34.0)
MCHC: 32.2 g/dL (ref 30.0–36.0)
MCV: 73.2 fL — ABNORMAL LOW (ref 78.0–100.0)
MONO ABS: 0.2 10*3/uL (ref 0.1–1.0)
Monocytes Relative: 4 %
NEUTROS ABS: 2.4 10*3/uL (ref 1.7–7.7)
Neutrophils Relative %: 55 %
PLATELETS: 296 10*3/uL (ref 150–400)
RBC: 5.04 MIL/uL (ref 3.87–5.11)
RDW: 13.8 % (ref 11.5–15.5)
WBC: 4.4 10*3/uL (ref 4.0–10.5)

## 2015-10-19 LAB — URINALYSIS, ROUTINE W REFLEX MICROSCOPIC
BILIRUBIN URINE: NEGATIVE
Glucose, UA: >1000 mg/dL — AB
KETONES UR: NEGATIVE mg/dL
Leukocytes, UA: NEGATIVE
NITRITE: NEGATIVE
Protein, ur: NEGATIVE mg/dL
Specific Gravity, Urine: 1.039 — ABNORMAL HIGH (ref 1.005–1.030)
pH: 5 (ref 5.0–8.0)

## 2015-10-19 LAB — URINE MICROSCOPIC-ADD ON
Bacteria, UA: NONE SEEN
WBC UA: NONE SEEN WBC/hpf (ref 0–5)

## 2015-10-19 LAB — BASIC METABOLIC PANEL
Anion gap: 10 (ref 5–15)
BUN: 14 mg/dL (ref 6–20)
CALCIUM: 9.1 mg/dL (ref 8.9–10.3)
CO2: 26 mmol/L (ref 22–32)
CREATININE: 0.71 mg/dL (ref 0.44–1.00)
Chloride: 102 mmol/L (ref 101–111)
GFR calc non Af Amer: >60 mL/min (ref >60)
Glucose, Bld: 152 mg/dL — ABNORMAL HIGH (ref 65–99)
Potassium: 3.2 mmol/L — ABNORMAL LOW (ref 3.5–5.1)
SODIUM: 138 mmol/L (ref 135–145)

## 2015-10-19 MED ORDER — ONDANSETRON 4 MG PO TBDP
4.0000 mg | ORAL_TABLET | Freq: Once | ORAL | Status: AC
  Administered 2015-10-19: 4 mg via ORAL
  Filled 2015-10-19: qty 1

## 2015-10-19 MED ORDER — HYDROCODONE-ACETAMINOPHEN 5-325 MG PO TABS
1.0000 | ORAL_TABLET | Freq: Once | ORAL | Status: AC
  Administered 2015-10-19: 1 via ORAL
  Filled 2015-10-19: qty 1

## 2015-10-19 MED ORDER — NAPROXEN 500 MG PO TABS: 500.0000 mg | ORAL_TABLET | Freq: Two times a day (BID) | ORAL | Status: DC

## 2015-10-19 NOTE — ED Provider Notes (Signed)
CSN: [id number]     Arrival date & time 10/19/15  1314 History   First MD Initiated Contact with Patient 10/19/15 1320     Chief Complaint  Patient presents with  . Abdominal Pain     HPI  Patient presents for evaluation of left lower abdominal pain.  She states is been present intimately for about 6 weeks. Only new feature of her health at that time was she started on toujeo injections. She's had normal bowel movements. Her last ingestion today. No urinary symptoms. No nausea or vomiting. Has had previous colonoscopy with single polypectomy. Does not know if she has diverticuli.  Past Medical History  Diagnosis Date  . Arthritis   . Diabetes mellitus   . Hypertension   . UTI (lower urinary tract infection)   . Anemia   . Thyroid disease    Past Surgical History  Procedure Laterality Date  . Partial hysterectomy      1995  . Colonoscopy     Family History  Problem Relation Age of Onset  . Diabetes    . Hypertension Mother   . Diabetes Mother   . Diabetes Sister   . Diabetes Paternal Grandmother    Social History  Substance Use Topics  . Smoking status: Never Smoker   . Smokeless tobacco: Never Used  . Alcohol Use: No   OB History    No data available     Review of Systems  Constitutional: Negative for fever, chills, diaphoresis, appetite change and fatigue.  HENT: Negative for mouth sores, sore throat and trouble swallowing.   Eyes: Negative for visual disturbance.  Respiratory: Negative for cough, chest tightness, shortness of breath and wheezing.   Cardiovascular: Negative for chest pain.  Gastrointestinal: Positive for abdominal pain. Negative for nausea, vomiting, diarrhea and abdominal distention.  Endocrine: Negative for polydipsia, polyphagia and polyuria.  Genitourinary: Negative for dysuria, frequency and hematuria.  Musculoskeletal: Negative for gait problem.  Skin: Negative for color change, pallor and rash.  Neurological: Negative for dizziness,  syncope, light-headedness and headaches.  Hematological: Does not bruise/bleed easily.  Psychiatric/Behavioral: Negative for behavioral problems and confusion.      Allergies  Penicillins and Scallops  Home Medications   Prior to Admission medications   Medication Sig Start Date End Date Taking? Authorizing Provider  aspirin EC 81 MG tablet Take 81 mg by mouth daily.   Yes Historical Provider, MD  Biotin 10000 MCG TABS Take 1 tablet by mouth daily.   Yes Historical Provider, MD  Dapagliflozin-Metformin HCl ER (XIGDUO XR) 08-998 MG TB24 Take 1 tablet by mouth 2 (two) times daily.   Yes Historical Provider, MD  Insulin Glargine (TOUJEO SOLOSTAR) 300 UNIT/ML SOPN Inject 300 mg into the skin at bedtime.   Yes Historical Provider, MD  levothyroxine (SYNTHROID, LEVOTHROID) 125 MCG tablet Take 125 mcg by mouth every morning.    Yes Historical Provider, MD  triamterene-hydrochlorothiazide (MAXZIDE) 75-50 MG per tablet Take 1 tablet by mouth every morning.    Yes Historical Provider, MD  naproxen (NAPROSYN) 500 MG tablet Take 1 tablet (500 mg total) by mouth 2 (two) times daily. 10/19/15   Tanna Furry, MD   BP 137/83 mmHg  Pulse 78  Temp(Src) 98.1 F (36.7 C) (Oral)  Resp 17  SpO2 98% Physical Exam  Constitutional: She is oriented to person, place, and time. She appears well-developed and well-nourished. No distress.  HENT:  Head: Normocephalic.  Eyes: Conjunctivae are normal. Pupils are equal, round, and reactive to  light. No scleral icterus.  Neck: Normal range of motion. Neck supple. No thyromegaly present.  Cardiovascular: Normal rate and regular rhythm.  Exam reveals no gallop and no friction rub.   No murmur heard. Pulmonary/Chest: Effort normal and breath sounds normal. No respiratory distress. She has no wheezes. She has no rales.  Abdominal: Soft. Bowel sounds are normal. She exhibits no distension. There is no tenderness. There is no rebound.    Musculoskeletal: Normal range of  motion.  Neurological: She is alert and oriented to person, place, and time.  Skin: Skin is warm and dry. No rash noted.  Psychiatric: She has a normal mood and affect. Her behavior is normal.    ED Course  Procedures (including critical care time) Labs Review Labs Reviewed  CBC WITH DIFFERENTIAL/PLATELET - Abnormal; Notable for the following:    Hemoglobin 11.9 (*)    MCV 73.2 (*)    MCH 23.6 (*)    All other components within normal limits  BASIC METABOLIC PANEL - Abnormal; Notable for the following:    Potassium 3.2 (*)    Glucose, Bld 152 (*)    All other components within normal limits  URINALYSIS, ROUTINE W REFLEX MICROSCOPIC (NOT AT [facility]) - Abnormal; Notable for the following:    Specific Gravity, Urine 1.039 (*)    Glucose, UA >1000 (*)    Hgb urine dipstick TRACE (*)    All other components within normal limits  URINE MICROSCOPIC-ADD ON - Abnormal; Notable for the following:    Squamous Epithelial / LPF 0-5 (*)    All other components within normal limits    Imaging Review Ct Renal Stone Study  10/19/2015  CLINICAL DATA:  Pt states for last 2 month on and off pain and burning in left lower stomach EXAM: CT ABDOMEN AND PELVIS WITHOUT CONTRAST TECHNIQUE: Multidetector CT imaging of the abdomen and pelvis was performed following the standard protocol without IV contrast. COMPARISON:  CT 05/25/2011 FINDINGS: Lower chest: No pulmonary nodules, pleural effusions, or infiltrates. Heart size is normal. No imaged pericardial effusion or significant coronary artery calcifications. Hepatobiliary: Liver is unremarkable in appearance. Gallbladder is present. Pancreas: Normal CT appearance. Spleen: Normal CT appearance.  Left upper quadrant splenule. Renal/Adrenal: Adrenal glands are normal in appearance. No intrarenal calculi. No hydronephrosis or renal mass. Gastrointestinal tract: The stomach is normal in appearance. The appendix is well seen and has a normal appearance. There is a large  amount of stool throughout nondilated loops colon. No evidence for colonic mass. Reproductive/Pelvis: The uterus is absent. Within the right adnexal region there is a hyperdense solid mass measuring 3.5 x 3.0 cm anterior to the ovary or involving the anterior aspect of the right ovary. This has a similar appearance to the previous exam and warrants characterization if not previously performed. No evidence for left adnexal mass. There is no free pelvic fluid. Vascular/Lymphatic: No evidence for aortic aneurysm. No retroperitoneal or mesenteric adenopathy. Musculoskeletal/Abdominal wall: Abdominal wall is unremarkable. There is endplate sclerosis and irregularity of the endplates at 075-GRM, associated significant disc height loss. Although the findings could be merely a degenerative, there has been progression since the prior study and is difficult to exclude discitis. At the same level there is anterolisthesis of 6 mm which is stable. No other suspicious bone lesions are identified. Other: none IMPRESSION: 1. Right adnexal solid mass warrants further evaluation. Ultrasound of the pelvis is recommended. 2. Normal appendix. 3. The severe degenerative change versus discitis at L4-5 associated with grade  1 anterolisthesis. This has progressed since the previous exam in 2013. Consider outpatient MRI for further evaluation. 4. Normal appendix. 5. Large amount of stool seen throughout nondilated loops of colon. Electronically Signed   By: Nolon Nations M.D.   On: 10/19/2015 14:23   I have personally reviewed and evaluated these images and lab results as part of my medical decision-making.   EKG Interpretation None      MDM   Final diagnoses:  Left lower quadrant pain  Constipation, unspecified constipation type  Ovarian mass   CT findings discussed with the patient. She has known about the ovarian mass since 2013 when it was noted on her previous CT. She had follow-up ultrasound with her OB/GYN. Advised her  to continue this follow-up. It does appear unchanged per description of CT findings today. CT describes increased stool burden. She has not had a bowel movement since yesterday, but is otherwise at her baseline. I encouraged a trial of over-the-counter laxative. Also recommended anti-inflammatory limited activity or strenuous activity as this may be abdominal wall pain.    Tanna Furry, MD 10/19/15 1539

## 2015-10-19 NOTE — ED Notes (Signed)
Pt states for last 2 month on and off pain and bunring in left lower stomach. Nothing makes it worse or better, no N/V/D or pain on urination

## 2015-10-19 NOTE — ED Notes (Signed)
Patient transported to CT. Unable to obtain blood at this time.

## 2015-10-19 NOTE — Discharge Instructions (Signed)
Naproxen for your abdominal wall pain. Avoid heavy lifting, bending, strenuous activity. Miralax for cosntipation as needed. Continue your follow up with your GYN re: Ovarian mass.   Abdominal Pain, Adult Many things can cause abdominal pain. Usually, abdominal pain is not caused by a disease and will improve without treatment. It can often be observed and treated at home. Your health care provider will do a physical exam and possibly order blood tests and X-rays to help determine the seriousness of your pain. However, in many cases, more time must pass before a clear cause of the pain can be found. Before that point, your health care provider may not know if you need more testing or further treatment. HOME CARE INSTRUCTIONS Monitor your abdominal pain for any changes. The following actions may help to alleviate any discomfort you are experiencing:  Only take over-the-counter or prescription medicines as directed by your health care provider.  Do not take laxatives unless directed to do so by your health care provider.  Try a clear liquid diet (broth, tea, or water) as directed by your health care provider. Slowly move to a bland diet as tolerated. SEEK MEDICAL CARE IF:  You have unexplained abdominal pain.  You have abdominal pain associated with nausea or diarrhea.  You have pain when you urinate or have a bowel movement.  You experience abdominal pain that wakes you in the night.  You have abdominal pain that is worsened or improved by eating food.  You have abdominal pain that is worsened with eating fatty foods.  You have a fever. SEEK IMMEDIATE MEDICAL CARE IF:  Your pain does not go away within 2 hours.  You keep throwing up (vomiting).  Your pain is felt only in portions of the abdomen, such as the right side or the left lower portion of the abdomen.  You pass bloody or black tarry stools. MAKE SURE YOU:  Understand these instructions.  Will watch your  condition.  Will get help right away if you are not doing well or get worse.   This information is not intended to replace advice given to you by your health care provider. Make sure you discuss any questions you have with your health care provider.   Document Released: 12/30/2004 Document Revised: 12/11/2014 Document Reviewed: 11/29/2012 Elsevier Interactive Patient Education 2016 [employer].  Constipation, Adult Constipation is when a person:  Poops (has a bowel movement) less than 3 times a week.  Has a hard time pooping.  Has poop that is dry, hard, or bigger than normal. HOME CARE   Eat foods with a lot of fiber in them. This includes fruits, vegetables, beans, and whole grains such as brown rice.  Avoid fatty foods and foods with a lot of sugar. This includes french fries, hamburgers, cookies, candy, and soda.  If you are not getting enough fiber from food, take products with added fiber in them (supplements).  Drink enough fluid to keep your pee (urine) clear or pale yellow.  Exercise on a regular basis, or as told by your doctor.  Go to the restroom when you feel like you need to poop. Do not hold it.  Only take medicine as told by your doctor. Do not take medicines that help you poop (laxatives) without talking to your doctor first. GET HELP RIGHT AWAY IF:   You have bright red blood in your poop (stool).  Your constipation lasts more than 4 days or gets worse.  You have belly (abdominal) or butt (  rectal) pain.  You have thin poop (as thin as a pencil).  You lose weight, and it cannot be explained. MAKE SURE YOU:   Understand these instructions.  Will watch your condition.  Will get help right away if you are not doing well or get worse.   This information is not intended to replace advice given to you by your health care provider. Make sure you discuss any questions you have with your health care provider.   Document Released: 09/08/2007 Document  Revised: 04/12/2014 Document Reviewed: 01/01/2013 Elsevier Interactive Patient Education [employer].

## 2015-10-31 ENCOUNTER — Encounter: Payer: Self-pay | Admitting: Internal Medicine

## 2015-10-31 ENCOUNTER — Other Ambulatory Visit (INDEPENDENT_AMBULATORY_CARE_PROVIDER_SITE_OTHER): Payer: Self-pay

## 2015-10-31 ENCOUNTER — Ambulatory Visit (INDEPENDENT_AMBULATORY_CARE_PROVIDER_SITE_OTHER): Payer: Self-pay | Admitting: Internal Medicine

## 2015-10-31 VITALS — BP 116/76 | HR 78 | Temp 98.2°F | Resp 16 | Ht 64.0 in | Wt 150.0 lb

## 2015-10-31 DIAGNOSIS — E038 Other specified hypothyroidism: Secondary | ICD-10-CM

## 2015-10-31 DIAGNOSIS — N9489 Other specified conditions associated with female genital organs and menstrual cycle: Secondary | ICD-10-CM

## 2015-10-31 DIAGNOSIS — E119 Type 2 diabetes mellitus without complications: Secondary | ICD-10-CM

## 2015-10-31 DIAGNOSIS — I10 Essential (primary) hypertension: Secondary | ICD-10-CM

## 2015-10-31 DIAGNOSIS — M25511 Pain in right shoulder: Secondary | ICD-10-CM

## 2015-10-31 DIAGNOSIS — M25512 Pain in left shoulder: Secondary | ICD-10-CM

## 2015-10-31 DIAGNOSIS — N949 Unspecified condition associated with female genital organs and menstrual cycle: Secondary | ICD-10-CM

## 2015-10-31 DIAGNOSIS — Z794 Long term (current) use of insulin: Secondary | ICD-10-CM

## 2015-10-31 LAB — COMPREHENSIVE METABOLIC PANEL
ALK PHOS: 95 U/L (ref 39–117)
ALT: 15 U/L (ref 0–35)
AST: 24 U/L (ref 0–37)
Albumin: 4.2 g/dL (ref 3.5–5.2)
BILIRUBIN TOTAL: 0.7 mg/dL (ref 0.2–1.2)
BUN: 13 mg/dL (ref 6–23)
CO2: 32 meq/L (ref 19–32)
Calcium: 9.6 mg/dL (ref 8.4–10.5)
Chloride: 100 mEq/L (ref 96–112)
Creatinine, Ser: 0.79 mg/dL (ref 0.40–1.20)
GFR: 95.91 mL/min (ref >60.00)
GLUCOSE: 170 mg/dL — AB (ref 70–99)
POTASSIUM: 3.8 meq/L (ref 3.5–5.1)
SODIUM: 137 meq/L (ref 135–145)
TOTAL PROTEIN: 7.8 g/dL (ref 6.0–8.3)

## 2015-10-31 LAB — CBC WITH DIFFERENTIAL/PLATELET
Basophils Absolute: 0 10*3/uL (ref 0.0–0.1)
Basophils Relative: 0.9 % (ref 0.0–3.0)
EOS PCT: 3.4 % (ref 0.0–5.0)
Eosinophils Absolute: 0.2 10*3/uL (ref 0.0–0.7)
HCT: 38.2 % (ref 36.0–46.0)
Hemoglobin: 12.4 g/dL (ref 12.0–15.0)
LYMPHS ABS: 1.9 10*3/uL (ref 0.7–4.0)
Lymphocytes Relative: 40.9 % (ref 12.0–46.0)
MCHC: 32.5 g/dL (ref 30.0–36.0)
MCV: 73.5 fl — AB (ref 78.0–100.0)
MONO ABS: 0.3 10*3/uL (ref 0.1–1.0)
MONOS PCT: 5.5 % (ref 3.0–12.0)
NEUTROS ABS: 2.3 10*3/uL (ref 1.4–7.7)
NEUTROS PCT: 49.3 % (ref 43.0–77.0)
PLATELETS: 292 10*3/uL (ref 150.0–400.0)
RBC: 5.19 Mil/uL — AB (ref 3.87–5.11)
RDW: 14.1 % (ref 11.5–15.5)
WBC: 4.8 10*3/uL (ref 4.0–10.5)

## 2015-10-31 LAB — LIPID PANEL
CHOL/HDL RATIO: 3
Cholesterol: 198 mg/dL (ref 0–200)
HDL: 77.6 mg/dL (ref >39.00)
LDL Cholesterol: 109 mg/dL — ABNORMAL HIGH (ref 0–99)
NONHDL: 120.46
Triglycerides: 57 mg/dL (ref 0.0–149.0)
VLDL: 11.4 mg/dL (ref 0.0–40.0)

## 2015-10-31 LAB — HEMOGLOBIN A1C: HEMOGLOBIN A1C: 7.5 % — AB (ref 4.6–6.5)

## 2015-10-31 LAB — TSH: TSH: 0.06 u[IU]/mL — AB (ref 0.35–4.50)

## 2015-10-31 LAB — MICROALBUMIN / CREATININE URINE RATIO
CREATININE, U: 38.3 mg/dL
MICROALB/CREAT RATIO: 1.8 mg/g (ref 0.0–30.0)

## 2015-10-31 NOTE — Assessment & Plan Note (Signed)
Check a1c, urine micro Continue current meds - she may want to change her meds based on cost

## 2015-10-31 NOTE — Assessment & Plan Note (Signed)
Check tsh  Titrate med dose if needed  

## 2015-10-31 NOTE — Assessment & Plan Note (Signed)
Concern for cancer - stressed follow up with gyn Will order CA 125 Will order stat pelvic/transvaginal US

## 2015-10-31 NOTE — Assessment & Plan Note (Signed)
Taking naprosyn twice daily - does not help Has not had it evaluated

## 2015-10-31 NOTE — Progress Notes (Signed)
Subjective:    Patient ID: Sylvia Conner, female    DOB: [birth year], 60 y.o.   MRN: [id number]  HPI She is here to establish with a new pcp.    Diabetes: She is taking her medication daily as prescribed. She is compliant with a diabetic diet. She is exercising regularly - has been walking regularly for two months (walks 1 mile 5 days a week). She has not checked her sugars in a while.  She checks her feet daily.  She has occasional tingling in her feet.  She has a sore on her foot that recurs.  She is applying witch hazel and cream. She is not up-to-date with an ophthalmology examination.   Hypertension: She is taking her medication daily. She is compliant with a low sodium diet.  She denies chest pain, palpitations, edema, shortness of breath and regular headaches. She is exercising regularly.  She does not monitor her blood pressure at home.    Hypothyroidism:  She is taking her medication daily.  She denies any recent changes in energy or weight that are unexplained.   LLQ pain:  It started about one month ago. It is intermittent.  It is not related to eating or bowel movements. It occurs randomly and does not last long.  She has normal BM without blood.  She had blood in her urine just over one month ago.  She was diagnosed with a UTI.  She had a Ct scan two weeks ago in the ED and the only concerning finding was right adnexal mass.  She has not made an appointment with her gyn yet.   Medications and allergies reviewed with patient and updated if appropriate.  Patient Active Problem List   Diagnosis Date Noted  . HTN (hypertension) 05/24/2011  . DM (diabetes mellitus) ([place]) 05/24/2011  . Hypothyroidism 05/24/2011  . History of hysterectomy 05/24/2011    Current Outpatient Prescriptions on File Prior to Visit  Medication Sig Dispense Refill  . aspirin EC 81 MG tablet Take 81 mg by mouth daily.    . Biotin 10000 MCG TABS Take 1 tablet by mouth daily.    . Dapagliflozin-Metformin  HCl ER (XIGDUO XR) 08-998 MG TB24 Take 1 tablet by mouth 2 (two) times daily.    . Insulin Glargine (TOUJEO SOLOSTAR) 300 UNIT/ML SOPN Inject 300 mg into the skin at bedtime.    Marland Kitchen levothyroxine (SYNTHROID, LEVOTHROID) 125 MCG tablet Take 125 mcg by mouth every morning.     . naproxen (NAPROSYN) 500 MG tablet Take 1 tablet (500 mg total) by mouth 2 (two) times daily. 30 tablet 0  . triamterene-hydrochlorothiazide (MAXZIDE) 75-50 MG per tablet Take 1 tablet by mouth every morning.      No current facility-administered medications on file prior to visit.     Past Medical History:  Diagnosis Date  . Anemia   . Arthritis   . Diabetes mellitus   . Hypertension   . Thyroid disease   . UTI (lower urinary tract infection)     Past Surgical History:  Procedure Laterality Date  . COLONOSCOPY    . PARTIAL HYSTERECTOMY     1995    Social History   Social History  . Marital status: Married    Spouse name: N/A  . Number of children: N/A  . Years of education: N/A   Social History Main Topics  . Smoking status: Never Smoker  . Smokeless tobacco: Never Used  . Alcohol use No  . Drug use:  No  . Sexual activity: Not on file   Other Topics Concern  . Not on file   Social History Narrative  . No narrative on file    Family History  Problem Relation Age of Onset  . Diabetes    . Hypertension Mother   . Diabetes Mother   . Diabetes Sister   . Diabetes Paternal Grandmother     Review of Systems  Constitutional: Negative for appetite change, chills and fever.       Hot flashes on occasion  Eyes: Positive for visual disturbance (occasional blurry vision).  Respiratory: Negative for cough, shortness of breath and wheezing.   Cardiovascular: Positive for leg swelling (occasional). Negative for chest pain and palpitations.  Gastrointestinal: Positive for abdominal pain (LLQ intermittent). Negative for blood in stool, constipation, diarrhea and nausea.  Endocrine: Positive for  polydipsia. Negative for polyuria.  Genitourinary: Negative for dysuria and hematuria.  Musculoskeletal: Positive for arthralgias (shoulders).  Neurological: Positive for dizziness (yesterday - at work - ? heat related). Negative for light-headedness and headaches.  Psychiatric/Behavioral: Negative for dysphoric mood. The patient is not nervous/anxious.        Objective:   Vitals:   10/31/15 0949  BP: 116/76  Pulse: 78  Resp: 16  Temp: 98.2 F (36.8 C)   Filed Weights   10/31/15 0949  Weight: 150 lb (68 kg)   Body mass index is 25.75 kg/m.   Physical Exam Constitutional: She appears well-developed and well-nourished. No distress.  HENT:  Head: Normocephalic and atraumatic.  Right Ear: External ear normal. Normal ear canal and TM Left Ear: External ear normal.  Normal ear canal and TM Mouth/Throat: Oropharynx is clear and moist.  Eyes: Conjunctivae and EOM are normal.  Neck: Neck supple. No tracheal deviation present. No thyromegaly present.  No carotid bruit  Cardiovascular: Normal rate, regular rhythm and normal heart sounds.   No murmur heard.  No edema. Pulmonary/Chest: Effort normal and breath sounds normal. No respiratory distress. She has no wheezes. She has no rales.  Breast: deferred to Gyn Abdominal: Soft. She exhibits no distension. There is no tenderness.  there are no masses Lymphadenopathy: She has no cervical adenopathy.  Skin: Skin is warm and dry. She is not diaphoretic.  Psychiatric: She has a normal mood and affect. Her behavior is normal.         Assessment & Plan:   See Problem List for Assessment and Plan of chronic medical problems.

## 2015-10-31 NOTE — Patient Instructions (Addendum)
  Test(s) ordered today. Your results will be released to [facility] [place] (or called to you) after review, usually within 72hours after test completion. If any changes need to be made, you will be notified at that same time.  All other Health Maintenance issues reviewed.   All recommended immunizations and age-appropriate screenings are up-to-date or discussed.  No immunizations administered today.   Medications reviewed and updated.  No changes recommended at this time.   A ultrasound was ordered to assess the ovarian mass.   Please followup in 6 months

## 2015-10-31 NOTE — Progress Notes (Signed)
Pre visit review using our clinic review tool, if applicable. No additional management support is needed unless otherwise documented below in the visit note. 

## 2015-10-31 NOTE — Assessment & Plan Note (Signed)
BP well controlled Current regimen effective and well tolerated Continue current medications at current doses cmp  

## 2015-11-01 LAB — CA 125: CA 125: 3 U/mL (ref <35)

## 2015-11-04 ENCOUNTER — Other Ambulatory Visit: Payer: Self-pay | Admitting: Emergency Medicine

## 2015-11-04 ENCOUNTER — Encounter: Payer: Self-pay | Admitting: Emergency Medicine

## 2015-11-04 MED ORDER — LEVOTHYROXINE SODIUM 112 MCG PO TABS: 112.0000 ug | ORAL_TABLET | Freq: Every day | ORAL | 1 refills | Status: DC

## 2015-11-06 ENCOUNTER — Other Ambulatory Visit: Payer: Self-pay

## 2015-11-13 ENCOUNTER — Ambulatory Visit
Admission: RE | Admit: 2015-11-13 | Discharge: 2015-11-13 | Disposition: A | Discharge status: Home / Self Care | Payer: BLUE CROSS/BLUE SHIELD | Source: Ambulatory Visit | Attending: Internal Medicine | Admitting: Internal Medicine

## 2015-11-13 DIAGNOSIS — N9489 Other specified conditions associated with female genital organs and menstrual cycle: Secondary | ICD-10-CM

## 2015-11-19 ENCOUNTER — Telehealth: Payer: Self-pay | Admitting: Internal Medicine

## 2015-11-19 MED ORDER — EMPAGLIFLOZIN 10 MG PO TABS: 10.0000 mg | ORAL_TABLET | Freq: Every day | ORAL | 3 refills | Status: DC

## 2015-11-19 MED ORDER — METFORMIN HCL 1000 MG PO TABS: 1000.0000 mg | ORAL_TABLET | Freq: Two times a day (BID) | ORAL | 3 refills | Status: DC

## 2015-11-19 NOTE — Telephone Encounter (Signed)
Med List is up to date, please advise if something else could be sent in besides Xigduo

## 2015-11-19 NOTE — Telephone Encounter (Signed)
Patient called in regarding prescriptions not updated to [place] in the system...  aspirin EC 81 MG tablet [id number]   Biotin 10000 MCG TABS [id number]   Insulin Glargine (TOUJEO SOLOSTAR) 300 UNIT/ML SOPN [id number]   triamterene-hydrochlorothiazide (MAXZIDE) 75-50 MG per tablet [id number]   She is up for refills, and it does not show that we have prescribed.   Additionally, she states that her insurance is requesting that we send a cheaper alternative for Dapagliflozin-Metformin HCl ER (XIGDUO XR) 08-998 MG TB24 [id number] . I requested that she reach out to her insurance to verify exactly what drug is cheaper on her formulary. She will update [place] with this .  Verified pharmacy is [facility] on [address]

## 2015-11-19 NOTE — Telephone Encounter (Signed)
See other Telephone Encounter for 11/19/2015

## 2015-11-19 NOTE — Telephone Encounter (Signed)
Lets d/c the Xigduo- in its place lets try metformin 1000 mg twice a day with breakfast and dinner and jardiance 10 mg daily.  This is similar to the combination medication she was taking, but should be cheaper. If it is not we have alternatives.  Okay to send with other refills she needs. I did send the above medications to [facility].  Please confirm insulin dose before sending

## 2015-11-19 NOTE — Telephone Encounter (Signed)
Pt stating XIGDUO need get PA start on this med. Please call pt once we start this process.

## 2015-11-20 NOTE — Telephone Encounter (Signed)
Tried calling pt, no answer.

## 2015-11-26 ENCOUNTER — Encounter: Payer: Self-pay | Admitting: Emergency Medicine

## 2015-11-26 NOTE — Telephone Encounter (Signed)
Tried calling pt again for lab results, unable to get an answer. Mailed letter with response.

## 2015-12-01 NOTE — Telephone Encounter (Signed)
Patient is also requesting refill on toujeo

## 2015-12-01 NOTE — Telephone Encounter (Signed)
Patient called back.  She is requesting a message to be left on her home phone at [phone]

## 2015-12-01 NOTE — Telephone Encounter (Signed)
Please advise on refill. This has not been sent under your name yet.

## 2015-12-02 ENCOUNTER — Telehealth: Payer: Self-pay | Admitting: Emergency Medicine

## 2015-12-02 MED ORDER — INSULIN GLARGINE 300 UNIT/ML ~~LOC~~ SOPN: 300.0000 mg | PEN_INJECTOR | Freq: Every day | SUBCUTANEOUS | 1 refills | Status: DC

## 2015-12-02 NOTE — Addendum Note (Signed)
Addended by: Terence Lux B on: 12/02/2015 10:24 AM   Modules accepted: Orders

## 2015-12-02 NOTE — Telephone Encounter (Signed)
Please advise on directions, current med list say 300 mg daily.

## 2015-12-02 NOTE — Telephone Encounter (Signed)
Pharmacy called and has questions about the instructions and dosage on Insulin Glargine (TOUJEO SOLOSTAR) 300 UNIT/ML SOPN. Please give them a call back. Thanks.

## 2015-12-02 NOTE — Telephone Encounter (Signed)
Ok to fill 

## 2015-12-03 NOTE — Telephone Encounter (Signed)
Please call her and get dose - I have not filled in past.

## 2015-12-04 MED ORDER — INSULIN GLARGINE 300 UNIT/ML ~~LOC~~ SOPN: 10.0000 [IU] | PEN_INJECTOR | Freq: Every day | SUBCUTANEOUS | 1 refills | Status: DC

## 2015-12-04 NOTE — Telephone Encounter (Signed)
Verified that pt was taking 10 units daily at bedtime, New RX has been sent to [facility]

## 2015-12-04 NOTE — Addendum Note (Signed)
Addended by: Terence Lux B on: 12/04/2015 10:26 AM   Modules accepted: Orders

## 2016-01-12 ENCOUNTER — Telehealth: Payer: Self-pay | Admitting: Internal Medicine

## 2016-01-12 NOTE — Telephone Encounter (Signed)
Patient states she believes she has a bladder infection.  State she has had bladder infections before.  Patient does not want to schedule appt.  Patient would like to know if Dr. Quay Burow would sent something to her pharmacy.

## 2016-01-12 NOTE — Telephone Encounter (Signed)
Please advise 

## 2016-01-12 NOTE — Telephone Encounter (Signed)
Called [facility] - got voicemail but did not leave a message.  What symptoms is she having?  Ideally I would like her to give a urine sample.  I did not send a prescription into her pharmacy because I was not able to speak with her, but I will.    In the future she should ideally be seen.

## 2016-01-13 ENCOUNTER — Other Ambulatory Visit: Payer: BLUE CROSS/BLUE SHIELD

## 2016-01-13 ENCOUNTER — Ambulatory Visit (INDEPENDENT_AMBULATORY_CARE_PROVIDER_SITE_OTHER): Payer: BLUE CROSS/BLUE SHIELD | Admitting: Internal Medicine

## 2016-01-13 ENCOUNTER — Telehealth: Payer: Self-pay

## 2016-01-13 VITALS — BP 138/80 | HR 86 | Resp 20 | Wt 150.0 lb

## 2016-01-13 DIAGNOSIS — R300 Dysuria: Secondary | ICD-10-CM

## 2016-01-13 DIAGNOSIS — Z794 Long term (current) use of insulin: Secondary | ICD-10-CM

## 2016-01-13 DIAGNOSIS — E119 Type 2 diabetes mellitus without complications: Secondary | ICD-10-CM

## 2016-01-13 DIAGNOSIS — I10 Essential (primary) hypertension: Secondary | ICD-10-CM

## 2016-01-13 MED ORDER — NITROFURANTOIN MONOHYD MACRO 100 MG PO CAPS: 100.0000 mg | ORAL_CAPSULE | Freq: Two times a day (BID) | ORAL | 0 refills | Status: DC

## 2016-01-13 NOTE — Telephone Encounter (Signed)
Tried calling pt, no answer, LVM

## 2016-01-13 NOTE — Telephone Encounter (Signed)
Labs placed.

## 2016-01-13 NOTE — Patient Instructions (Signed)
Please take all new medication as prescribed - the macrobid (nitrofurantoin)  Your specimen is being tested for infection with a culture  Please continue all other medications as before, and refills have been done if requested.  Please have the pharmacy call with any other refills you may need.  Please keep your appointments with your specialists as you may have planned

## 2016-01-13 NOTE — Progress Notes (Signed)
Pre visit review using our clinic review tool, if applicable. No additional management support is needed unless otherwise documented below in the visit note. 

## 2016-01-16 LAB — CULTURE, URINE COMPREHENSIVE

## 2016-01-16 NOTE — Progress Notes (Signed)
Subjective:    Patient ID: Sylvia Conner, female    DOB: [birth year], 60 y.o.   MRN: [id number]  HPI  Here with acute; c/o 2 days acute onset mild to mod urinary symptoms with dysuria frequency and hematuria, but no n/v, fever, chills, urgency, flank pain. Symptoms intermittent only with urination, none between.  Nothing else makes better or worse.  Pt denies chest pain, increased sob or doe, wheezing, orthopnea, PND, increased LE swelling, palpitations, dizziness or syncope.  Not confused, Denies worsening reflux, abd pain, dysphagia, n/v, bowel change or blood.   Pt denies polydipsia, polyuria,.     Past Medical History:  Diagnosis Date  . Anemia   . Arthritis   . Diabetes mellitus   . Hypertension   . Thyroid disease   . UTI (lower urinary tract infection)    Past Surgical History:  Procedure Laterality Date  . COLONOSCOPY    . PARTIAL HYSTERECTOMY     1995    reports that she has never smoked. She has never used smokeless tobacco. She reports that she does not drink alcohol or use drugs. family history includes Diabetes in her mother, paternal grandmother, and sister; Hypertension in her mother.  Current Outpatient Prescriptions on File Prior to Visit  Medication Sig Dispense Refill  . aspirin EC 81 MG tablet Take 81 mg by mouth daily.    . Biotin 10000 MCG TABS Take 1 tablet by mouth daily.    . empagliflozin (JARDIANCE) 10 MG TABS tablet Take 10 mg by mouth daily. 30 tablet 3  . Insulin Glargine (TOUJEO SOLOSTAR) 300 UNIT/ML SOPN Inject 10 Units into the skin at bedtime. 5 pen 1  . levothyroxine (SYNTHROID) 112 MCG tablet Take 1 tablet (112 mcg total) by mouth daily before breakfast. 90 tablet 1  . metFORMIN (GLUCOPHAGE) 1000 MG tablet Take 1 tablet (1,000 mg total) by mouth 2 (two) times daily with a meal. 60 tablet 3  . naproxen (NAPROSYN) 500 MG tablet Take 1 tablet (500 mg total) by mouth 2 (two) times daily. 30 tablet 0  . triamterene-hydrochlorothiazide (MAXZIDE) 75-50  MG per tablet Take 1 tablet by mouth every morning.      No current facility-administered medications on file prior to visit.    Review of Systems  Constitutional: Negative for unusual diaphoresis or night sweats HENT: Negative for ear swelling or discharge Eyes: Negative for worsening visual haziness  Respiratory: Negative for choking and stridor.   Gastrointestinal: Negative for distension or worsening eructation Genitourinary: Negative for retention or change in urine volume.  Musculoskeletal: Negative for other MSK pain or swelling Skin: Negative for color change and worsening wound Neurological: Negative for tremors and numbness other than noted  Psychiatric/Behavioral: Negative for decreased concentration or agitation other than above       Objective:   Physical Exam BP 138/80   Pulse 86   Resp 20   Wt 150 lb (68 kg)   SpO2 97%   BMI 25.75 kg/m  VS noted, mild ill Constitutional: Pt appears in no apparent distress HENT: Head: NCAT.  Right Ear: External ear normal.  Left Ear: External ear normal.  Eyes: . Pupils are equal, round, and reactive to light. Conjunctivae and EOM are normal Neck: Normal range of motion. Neck supple.  Cardiovascular: Normal rate and regular rhythm.   Pulmonary/Chest: Effort normal and breath sounds without rales or wheezing.  Abd:  Soft, ND, + BS with mild low mid abd tender without guarding or rebound, no flank  tenderness Neurological: Pt is alert. Not confused , motor grossly intact Skin: Skin is warm. No rash, no LE edema Psychiatric: Pt behavior is normal. No agitation.     Assessment & Plan:

## 2016-01-16 NOTE — Assessment & Plan Note (Signed)
Mild to mod, c/w prob cystitis, for urine studies, for antibx course,  to f/u any worsening symptoms or concerns

## 2016-01-16 NOTE — Assessment & Plan Note (Signed)
stable overall by history and exam, recent data reviewed with pt, and pt to continue medical treatment as before,  to f/u any worsening symptoms or concerns Lab Results  Component Value Date   HGBA1C 7.5 (H) 10/31/2015

## 2016-01-16 NOTE — Assessment & Plan Note (Signed)
stable overall by history and exam, recent data reviewed with pt, and pt to continue medical treatment as before,  to f/u any worsening symptoms or concerns BP Readings from Last 3 Encounters:  01/13/16 138/80  10/31/15 116/76  10/19/15 109/78

## 2016-01-20 ENCOUNTER — Encounter: Payer: Self-pay | Admitting: Internal Medicine

## 2016-01-22 ENCOUNTER — Telehealth: Payer: Self-pay | Admitting: Internal Medicine

## 2016-01-22 MED ORDER — CIPROFLOXACIN HCL 250 MG PO TABS: 250.0000 mg | ORAL_TABLET | Freq: Two times a day (BID) | ORAL | 0 refills | Status: DC

## 2016-01-22 NOTE — Telephone Encounter (Signed)
Stop nitrofurantoin.  Start cipro - sent to [facility].  If no improvement she needs to call so we can check her urine again.

## 2016-01-22 NOTE — Telephone Encounter (Signed)
Please advise 

## 2016-01-22 NOTE — Telephone Encounter (Signed)
LVM informing pt

## 2016-01-22 NOTE — Telephone Encounter (Signed)
Patient states she was last seen by Dr. Jenny Reichmann for a bladder infection.  Patient states she is still having lower back pain and blood in her urine.  Patient would like to know what to do from here.

## 2016-02-05 ENCOUNTER — Ambulatory Visit (INDEPENDENT_AMBULATORY_CARE_PROVIDER_SITE_OTHER): Payer: BLUE CROSS/BLUE SHIELD | Admitting: Internal Medicine

## 2016-02-05 ENCOUNTER — Telehealth: Payer: Self-pay

## 2016-02-05 ENCOUNTER — Encounter: Payer: Self-pay | Admitting: Internal Medicine

## 2016-02-05 ENCOUNTER — Other Ambulatory Visit (INDEPENDENT_AMBULATORY_CARE_PROVIDER_SITE_OTHER): Payer: BLUE CROSS/BLUE SHIELD

## 2016-02-05 VITALS — BP 130/82 | HR 82 | Temp 98.2°F | Ht 63.0 in | Wt 152.0 lb

## 2016-02-05 DIAGNOSIS — E119 Type 2 diabetes mellitus without complications: Secondary | ICD-10-CM | POA: Diagnosis not present

## 2016-02-05 DIAGNOSIS — Z794 Long term (current) use of insulin: Secondary | ICD-10-CM

## 2016-02-05 DIAGNOSIS — R197 Diarrhea, unspecified: Secondary | ICD-10-CM | POA: Diagnosis not present

## 2016-02-05 LAB — CBC WITH DIFFERENTIAL/PLATELET
Basophils Absolute: 0 10*3/uL (ref 0.0–0.1)
Basophils Relative: 0.8 % (ref 0.0–3.0)
EOS PCT: 3.8 % (ref 0.0–5.0)
Eosinophils Absolute: 0.2 10*3/uL (ref 0.0–0.7)
HCT: 39.1 % (ref 36.0–46.0)
Hemoglobin: 12.5 g/dL (ref 12.0–15.0)
LYMPHS ABS: 1.7 10*3/uL (ref 0.7–4.0)
Lymphocytes Relative: 36.3 % (ref 12.0–46.0)
MCHC: 32 g/dL (ref 30.0–36.0)
MCV: 74.3 fl — AB (ref 78.0–100.0)
MONO ABS: 0.3 10*3/uL (ref 0.1–1.0)
Monocytes Relative: 6.2 % (ref 3.0–12.0)
NEUTROS ABS: 2.4 10*3/uL (ref 1.4–7.7)
NEUTROS PCT: 52.9 % (ref 43.0–77.0)
PLATELETS: 303 10*3/uL (ref 150.0–400.0)
RBC: 5.27 Mil/uL — ABNORMAL HIGH (ref 3.87–5.11)
RDW: 13.6 % (ref 11.5–15.5)
WBC: 4.6 10*3/uL (ref 4.0–10.5)

## 2016-02-05 LAB — COMPREHENSIVE METABOLIC PANEL
ALT: 23 U/L (ref 0–35)
AST: 32 U/L (ref 0–37)
Albumin: 4.2 g/dL (ref 3.5–5.2)
Alkaline Phosphatase: 95 U/L (ref 39–117)
BUN: 14 mg/dL (ref 6–23)
CO2: 32 meq/L (ref 19–32)
Calcium: 9.7 mg/dL (ref 8.4–10.5)
Chloride: 98 mEq/L (ref 96–112)
Creatinine, Ser: 0.68 mg/dL (ref 0.40–1.20)
GFR: 113.92 mL/min (ref >60.00)
GLUCOSE: 173 mg/dL — AB (ref 70–99)
POTASSIUM: 3.4 meq/L — AB (ref 3.5–5.1)
SODIUM: 136 meq/L (ref 135–145)
TOTAL PROTEIN: 7.8 g/dL (ref 6.0–8.3)
Total Bilirubin: 0.6 mg/dL (ref 0.2–1.2)

## 2016-02-05 LAB — HEMOGLOBIN A1C: HEMOGLOBIN A1C: 6.8 % — AB (ref 4.6–6.5)

## 2016-02-05 MED ORDER — SITAGLIPTIN PHOSPHATE 100 MG PO TABS: 100.0000 mg | ORAL_TABLET | Freq: Every day | ORAL | 1 refills | Status: DC

## 2016-02-05 NOTE — Assessment & Plan Note (Signed)
Present for one month-watery stools once a day, no blood, crampy abdominal pain prior to a bowel movement which resolves after a bowel movement No other concerning symptoms She has been on 2 antibiotics in the past month, but this does not sound like C. difficile diarrhea-we'll hold off on cultures for now Symptoms are not consistent with a infection or inflammatory bowel disease Start Probiotic Check CBC, CMP If no improvement will consider imaging or referral to GI, but at this point she has seen slight improvement. There are no concerning symptoms warranting further evaluation-she will call her symptoms do not continue to improve

## 2016-02-05 NOTE — Progress Notes (Signed)
Subjective:    Patient ID: Sylvia Conner, female    DOB: Aug 05, 1955, 60 y.o.   MRN: [id number]  HPI She is here for an acute visit.   Diarrhea:  She started having diarrhea one month ago - it occurs once day.  It is not as watery as when it first started. She can hear her stomach gurgling at ngiht.  She has cramping before a Bowel movement and it goes away after.  She denies blood in the stool.   She was on two antibiotic in the past month for a UTI.  She denies changes in medication besides that.  There has not been any diet changes.    Diabetes: She is taking her medication daily as prescribed. She is compliant with a diabetic diet. She is exercising -  Walks a couple of times a week. The majority of this is too expensive and she would like to try something cheaper. She denies any side effects from the medication.  Medications and allergies reviewed with patient and updated if appropriate.  Patient Active Problem List   Diagnosis Date Noted  . Dysuria 01/13/2016  . Pain in joint, shoulder region 10/31/2015  . Adnexal mass 10/31/2015  . HTN (hypertension) 05/24/2011  . DM (diabetes mellitus) ([place]) 05/24/2011  . Hypothyroidism 05/24/2011  . History of hysterectomy 05/24/2011    Current Outpatient Prescriptions on File Prior to Visit  Medication Sig Dispense Refill  . aspirin EC 81 MG tablet Take 81 mg by mouth daily.    . Biotin 10000 MCG TABS Take 1 tablet by mouth daily.    . empagliflozin (JARDIANCE) 10 MG TABS tablet Take 10 mg by mouth daily. 30 tablet 3  . Insulin Glargine (TOUJEO SOLOSTAR) 300 UNIT/ML SOPN Inject 10 Units into the skin at bedtime. 5 pen 1  . levothyroxine (SYNTHROID) 112 MCG tablet Take 1 tablet (112 mcg total) by mouth daily before breakfast. 90 tablet 1  . metFORMIN (GLUCOPHAGE) 1000 MG tablet Take 1 tablet (1,000 mg total) by mouth 2 (two) times daily with a meal. 60 tablet 3  . naproxen (NAPROSYN) 500 MG tablet Take 1 tablet (500 mg total) by mouth 2  (two) times daily. 30 tablet 0  . triamterene-hydrochlorothiazide (MAXZIDE) 75-50 MG per tablet Take 1 tablet by mouth every morning.     . ciprofloxacin (CIPRO) 250 MG tablet Take 1 tablet (250 mg total) by mouth 2 (two) times daily. (Patient not taking: Reported on 02/05/2016) 10 tablet 0   No current facility-administered medications on file prior to visit.     Past Medical History:  Diagnosis Date  . Anemia   . Arthritis   . Diabetes mellitus   . Hypertension   . Thyroid disease   . UTI (lower urinary tract infection)     Past Surgical History:  Procedure Laterality Date  . COLONOSCOPY    . PARTIAL HYSTERECTOMY     1995    Social History   Social History  . Marital status: Married    Spouse name: N/A  . Number of children: N/A  . Years of education: N/A   Social History Main Topics  . Smoking status: Never Smoker  . Smokeless tobacco: Never Used  . Alcohol use No  . Drug use: No  . Sexual activity: Not Asked   Other Topics Concern  . None   Social History Narrative  . None    Family History  Problem Relation Age of Onset  . Hypertension Mother   .  Diabetes Mother   . Diabetes    . Diabetes Sister   . Diabetes Paternal Grandmother     Review of Systems  Constitutional: Negative for appetite change, chills and fever.  Gastrointestinal: Positive for abdominal pain (cramping) and diarrhea. Negative for blood in stool, constipation, nausea and vomiting.       GERD  Genitourinary: Negative for dysuria and hematuria.  Neurological: Positive for light-headedness (occ) and headaches (occ).       Objective:   Vitals:   02/05/16 0843  BP: 130/82  Pulse: 82  Temp: 98.2 F (36.8 C)   Filed Weights   02/05/16 0843  Weight: 152 lb (68.9 kg)   Body mass index is 26.93 kg/m.   Physical Exam  Constitutional: She appears well-developed and well-nourished. No distress.  HENT:  Head: Normocephalic and atraumatic.  Eyes: Conjunctivae are normal.    Abdominal: Soft. She exhibits no distension and no mass. There is tenderness (Mild tenderness left lower quadrant). There is no rebound and no guarding.  Slightly increased bowel sounds  Musculoskeletal: She exhibits no edema.  Skin: Skin is warm and dry. She is not diaphoretic.          Assessment & Plan:   See Problem List for Assessment and Plan of chronic medical problems.

## 2016-02-05 NOTE — Progress Notes (Signed)
Pre visit review using our clinic review tool, if applicable. No additional management support is needed unless otherwise documented below in the visit note. 

## 2016-02-05 NOTE — Patient Instructions (Signed)
Start taking a probiotic daily - they are over the counter.   Have blood work done today.  We will call you with the results.   If your symptoms do not improve please call.   Stop jardiance.  Start januvia 100 mg daily.

## 2016-02-05 NOTE — Assessment & Plan Note (Signed)
Check A1c today Drug use has been too expensive and she stopped taking it one week ago-we'll officially discontinue Start Januvia 100 mg daily-she will let me know if this is too expensive Continue insulin Continue metformin at current dose Stressed diabetic diet and regular exercise

## 2016-02-05 NOTE — Telephone Encounter (Signed)
PA initiated and APPROVED 02/05/2016 through 04/04/2038 via CoverMyMeds key [id number]

## 2016-02-19 ENCOUNTER — Telehealth: Payer: Self-pay | Admitting: Internal Medicine

## 2016-02-19 NOTE — Telephone Encounter (Signed)
Spoke with pt to inform.  

## 2016-02-19 NOTE — Telephone Encounter (Signed)
Please advise as Dr Burns is out of the office.  

## 2016-02-19 NOTE — Telephone Encounter (Signed)
With an A1c of 6.8 there may not be need for additional medications. Please have her continue to take the Toujeo and Metformin. If her blood sugars increase she can increase her Toujeo to 12-15 units.

## 2016-02-19 NOTE — Telephone Encounter (Signed)
Patient Name: Sylvia Conner  DOB: January 22, 1956    Initial Comment Caller states, she was put on a diabetic pill and was changed to another rx. 5 days in and she is having pain from her back, chest and left arm pain. Stopped taking the rx. two days ago and not currently having these symptoms due to stopping the medication. Verified    Nurse Assessment  Nurse: Raphael Gibney, RN, Vanita Ingles Date/Time Eilene Ghazi Time): 02/19/2016 9:12:28 AM  Confirm and document reason for call. If symptomatic, describe symptoms. You must click the next button to save text entered. ---Caller states she was taking Jardiance for her diabetes and she could not afford it and started [place] for her diabetes. Took medication for 5 days. had chest pain, back pain and left arm pain. Her left arm was numb and cold and she took 2 ASA. She stopped the [place] 4 days ago and has not had any chest pain, back or left arm pain. Blood sugar 110 yesterday and 144 today.  Has the patient traveled out of the country within the last 30 days? ---No  Does the patient have any new or worsening symptoms? ---Yes  Will a triage be completed? ---Yes  Related visit to physician within the last 2 weeks? ---No  Does the PT have any chronic conditions? (i.e. diabetes, asthma, etc.) ---Yes  List chronic conditions. ---diabetes; HTN  Is this a behavioral health or substance abuse call? ---No     Guidelines    Guideline Title Affirmed Question Affirmed Notes  Chest Pain [1] Chest pain lasts > 5 minutes AND [2] occurred > 3 days ago (72 hours) AND [3] NO chest pain or cardiac symptoms now    Final Disposition User   See Physician within 24 Hours Stringer, RN, Vanita Ingles    Comments  Pt states she is unable to come in for appt as she has a lot of errands to run. She would like call back from doctor about whether she needs to take another diabetic medication instead of Januvia with her toujeo and metformin. Please call pt back regarding medication.   Referrals  GO  TO FACILITY REFUSED   Disagree/Comply: Disagree  Disagree/Comply Reason: Disagree with instructions

## 2016-02-23 ENCOUNTER — Telehealth: Payer: Self-pay | Admitting: Emergency Medicine

## 2016-02-23 MED ORDER — JARDIANCE 10 MG PO TABS: 10.0000 mg | ORAL_TABLET | Freq: Every day | ORAL | 5 refills | Status: DC

## 2016-02-23 NOTE — Telephone Encounter (Signed)
Sent to [facility] 

## 2016-02-23 NOTE — Telephone Encounter (Signed)
Pt called stating that the Januvia is causing too many side effects (chest pain, arm pain) she was advised to stop taking. She would like to be put back on the Jardiance. Please send to [facility].

## 2016-04-22 ENCOUNTER — Emergency Department (HOSPITAL_COMMUNITY)
Admission: EM | Admit: 2016-04-22 | Discharge: 2016-04-22 | Disposition: A | Discharge status: Home / Self Care | Payer: BLUE CROSS/BLUE SHIELD | Attending: Emergency Medicine | Admitting: Emergency Medicine

## 2016-04-22 ENCOUNTER — Encounter (HOSPITAL_COMMUNITY): Payer: Self-pay | Admitting: Emergency Medicine

## 2016-04-22 ENCOUNTER — Ambulatory Visit: Payer: BLUE CROSS/BLUE SHIELD | Admitting: Nurse Practitioner

## 2016-04-22 ENCOUNTER — Emergency Department (HOSPITAL_COMMUNITY): Payer: BLUE CROSS/BLUE SHIELD

## 2016-04-22 DIAGNOSIS — Z794 Long term (current) use of insulin: Secondary | ICD-10-CM | POA: Insufficient documentation

## 2016-04-22 DIAGNOSIS — E119 Type 2 diabetes mellitus without complications: Secondary | ICD-10-CM | POA: Insufficient documentation

## 2016-04-22 DIAGNOSIS — E039 Hypothyroidism, unspecified: Secondary | ICD-10-CM | POA: Insufficient documentation

## 2016-04-22 DIAGNOSIS — I10 Essential (primary) hypertension: Secondary | ICD-10-CM | POA: Insufficient documentation

## 2016-04-22 DIAGNOSIS — Z7982 Long term (current) use of aspirin: Secondary | ICD-10-CM | POA: Diagnosis not present

## 2016-04-22 DIAGNOSIS — R1032 Left lower quadrant pain: Secondary | ICD-10-CM

## 2016-04-22 DIAGNOSIS — K529 Noninfective gastroenteritis and colitis, unspecified: Secondary | ICD-10-CM | POA: Diagnosis not present

## 2016-04-22 LAB — URINALYSIS, ROUTINE W REFLEX MICROSCOPIC
BILIRUBIN URINE: NEGATIVE
KETONES UR: NEGATIVE mg/dL
NITRITE: NEGATIVE
PH: 5 (ref 5.0–8.0)
PROTEIN: NEGATIVE mg/dL
Specific Gravity, Urine: 1.032 — ABNORMAL HIGH (ref 1.005–1.030)

## 2016-04-22 LAB — COMPREHENSIVE METABOLIC PANEL
ALK PHOS: 86 U/L (ref 38–126)
ALT: 17 U/L (ref 14–54)
AST: 32 U/L (ref 15–41)
Albumin: 4.1 g/dL (ref 3.5–5.0)
Anion gap: 10 (ref 5–15)
BUN: 18 mg/dL (ref 6–20)
CALCIUM: 9.2 mg/dL (ref 8.9–10.3)
CHLORIDE: 104 mmol/L (ref 101–111)
CO2: 24 mmol/L (ref 22–32)
CREATININE: 0.55 mg/dL (ref 0.44–1.00)
GFR calc Af Amer: >60 mL/min (ref >60)
GFR calc non Af Amer: >60 mL/min (ref >60)
Glucose, Bld: 156 mg/dL — ABNORMAL HIGH (ref 65–99)
Potassium: 3.4 mmol/L — ABNORMAL LOW (ref 3.5–5.1)
SODIUM: 138 mmol/L (ref 135–145)
Total Bilirubin: 0.7 mg/dL (ref 0.3–1.2)
Total Protein: 8.4 g/dL — ABNORMAL HIGH (ref 6.5–8.1)

## 2016-04-22 LAB — CBC
HCT: 40.7 % (ref 36.0–46.0)
HEMOGLOBIN: 13.4 g/dL (ref 12.0–15.0)
MCH: 24.2 pg — AB (ref 26.0–34.0)
MCHC: 32.9 g/dL (ref 30.0–36.0)
MCV: 73.5 fL — ABNORMAL LOW (ref 78.0–100.0)
PLATELETS: 347 10*3/uL (ref 150–400)
RBC: 5.54 MIL/uL — AB (ref 3.87–5.11)
RDW: 13.7 % (ref 11.5–15.5)
WBC: 9.6 10*3/uL (ref 4.0–10.5)

## 2016-04-22 LAB — LIPASE, BLOOD: LIPASE: 28 U/L (ref 11–51)

## 2016-04-22 MED ORDER — PANTOPRAZOLE SODIUM 40 MG PO TBEC: 40.0000 mg | DELAYED_RELEASE_TABLET | Freq: Every day | ORAL | 0 refills | Status: DC

## 2016-04-22 MED ORDER — IOPAMIDOL (ISOVUE-300) INJECTION 61%
30.0000 mL | Freq: Once | INTRAVENOUS | Status: AC | PRN
  Administered 2016-04-22: 30 mL via ORAL

## 2016-04-22 MED ORDER — IOPAMIDOL (ISOVUE-300) INJECTION 61%
INTRAVENOUS | Status: AC
  Filled 2016-04-22: qty 30

## 2016-04-22 MED ORDER — POTASSIUM CHLORIDE CRYS ER 20 MEQ PO TBCR
40.0000 meq | EXTENDED_RELEASE_TABLET | Freq: Once | ORAL | Status: AC
  Administered 2016-04-22: 40 meq via ORAL
  Filled 2016-04-22: qty 2

## 2016-04-22 MED ORDER — SODIUM CHLORIDE 0.9 % IV BOLUS (SEPSIS)
500.0000 mL | Freq: Once | INTRAVENOUS | Status: AC
  Administered 2016-04-22: 500 mL via INTRAVENOUS

## 2016-04-22 MED ORDER — FENTANYL CITRATE (PF) 100 MCG/2ML IJ SOLN
50.0000 ug | Freq: Once | INTRAMUSCULAR | Status: AC
  Administered 2016-04-22: 50 ug via INTRAVENOUS
  Filled 2016-04-22: qty 2

## 2016-04-22 MED ORDER — ONDANSETRON 4 MG PO TBDP: 4.0000 mg | ORAL_TABLET | Freq: Three times a day (TID) | ORAL | 0 refills | Status: DC | PRN

## 2016-04-22 MED ORDER — ONDANSETRON HCL 4 MG/2ML IJ SOLN
4.0000 mg | Freq: Once | INTRAMUSCULAR | Status: AC
  Administered 2016-04-22: 4 mg via INTRAVENOUS
  Filled 2016-04-22: qty 2

## 2016-04-22 MED ORDER — IOPAMIDOL (ISOVUE-300) INJECTION 61%
100.0000 mL | Freq: Once | INTRAVENOUS | Status: AC | PRN
  Administered 2016-04-22: 100 mL via INTRAVENOUS

## 2016-04-22 NOTE — ED Notes (Signed)
Patient transported to CT 

## 2016-04-22 NOTE — Discharge Instructions (Signed)
Your CT scan shows some "gastritis" which is inflammation of the stomach. If you develop continued vomiting, upper abdominal pain, or blood in or black stools, then return to the ER or see your doctor

## 2016-04-22 NOTE — ED Triage Notes (Signed)
Pt reports lower abd pain worse on L side for the past 3 weeks. Last night had 3 episodes of diarrhea and 1 episode of emesis which is new.

## 2016-04-22 NOTE — ED Provider Notes (Signed)
[facility] DEPT Provider Note   CSN: [id number] Arrival date & time: 04/22/16  0848     History   Chief Complaint Chief Complaint  Patient presents with  . Abdominal Pain    HPI Sylvia Conner is a 61 y.o. female.  HPI  61 year old female presents with a chief complaint of vomiting and diarrhea. She notes she's had left lower quadrant and now left back pain daily on and off for the past 3 or so weeks. Does not seem worse today but this morning vomited "green vomit" and had 3 loose, watery stools. No blood in either. The pain is mostly present when sitting up or when she first gets up to walk around. No pain at rest. No hematuria or dysuria. No fevers. This pain is different than when she was seen in July 2017. Currently feels nauseated.  Past Medical History:  Diagnosis Date  . Anemia   . Arthritis   . Diabetes mellitus   . Hypertension   . Thyroid disease   . UTI (lower urinary tract infection)     Patient Active Problem List   Diagnosis Date Noted  . Diarrhea 02/05/2016  . Dysuria 01/13/2016  . Pain in joint, shoulder region 10/31/2015  . Adnexal mass 10/31/2015  . HTN (hypertension) 05/24/2011  . DM (diabetes mellitus) ([place]) 05/24/2011  . Hypothyroidism 05/24/2011  . History of hysterectomy 05/24/2011    Past Surgical History:  Procedure Laterality Date  . COLONOSCOPY    . PARTIAL HYSTERECTOMY     1995    OB History    No data available       Home Medications    Prior to Admission medications   Medication Sig Start Date End Date Taking? Authorizing Provider  aspirin EC 81 MG tablet Take 81 mg by mouth daily.   Yes Historical Provider, MD  Insulin Glargine (TOUJEO SOLOSTAR) 300 UNIT/ML SOPN Inject 10 Units into the skin at bedtime. 12/04/15  Yes Binnie Rail, MD  JARDIANCE 10 MG TABS tablet Take 10 mg by mouth daily. 02/23/16  Yes Binnie Rail, MD  levothyroxine (SYNTHROID) 112 MCG tablet Take 1 tablet (112 mcg total) by mouth daily before  breakfast. 11/04/15  Yes Binnie Rail, MD  metFORMIN (GLUCOPHAGE) 1000 MG tablet Take 1 tablet (1,000 mg total) by mouth 2 (two) times daily with a meal. 11/19/15  Yes Binnie Rail, MD  Multiple Vitamins-Minerals (HAIR SKIN AND NAILS FORMULA PO) Take 1 tablet by mouth daily.   Yes Historical Provider, MD  triamterene-hydrochlorothiazide (MAXZIDE) 75-50 MG per tablet Take 1 tablet by mouth every morning.    Yes Historical Provider, MD  naproxen (NAPROSYN) 500 MG tablet Take 1 tablet (500 mg total) by mouth 2 (two) times daily. Patient not taking: Reported on 04/22/2016 10/19/15   Tanna Furry, MD  ondansetron (ZOFRAN ODT) 4 MG disintegrating tablet Take 1 tablet (4 mg total) by mouth every 8 (eight) hours as needed for nausea or vomiting. 04/22/16   Sherwood Gambler, MD  pantoprazole (PROTONIX) 40 MG tablet Take 1 tablet (40 mg total) by mouth daily. 04/22/16   Sherwood Gambler, MD    Family History Family History  Problem Relation Age of Onset  . Hypertension Mother   . Diabetes Mother   . Diabetes    . Diabetes Sister   . Diabetes Paternal Grandmother     Social History Social History  Substance Use Topics  . Smoking status: Never Smoker  . Smokeless tobacco: Never Used  .  Alcohol use No     Allergies   Penicillins; Januvia [sitagliptin]; and Scallops [shellfish allergy]   Review of Systems Review of Systems  Constitutional: Negative for fever.  Gastrointestinal: Positive for abdominal pain, diarrhea, nausea and vomiting. Negative for blood in stool.  Genitourinary: Negative for dysuria and hematuria.  Musculoskeletal: Positive for back pain.  All other systems reviewed and are negative.    Physical Exam Updated Vital Signs BP 112/70 (BP Location: Left Arm)   Pulse 86   Temp 97.8 F (36.6 C) (Oral)   Resp 16   SpO2 100%   Physical Exam  Constitutional: She is oriented to person, place, and time. She appears well-developed and well-nourished. No distress.  HENT:  Head:  Normocephalic and atraumatic.  Right Ear: External ear normal.  Left Ear: External ear normal.  Nose: Nose normal.  Eyes: Right eye exhibits no discharge. Left eye exhibits no discharge.  Cardiovascular: Normal rate, regular rhythm and normal heart sounds.   Pulmonary/Chest: Effort normal and breath sounds normal.  Abdominal: Soft. There is tenderness (mild). There is no CVA tenderness.  Neurological: She is alert and oriented to person, place, and time.  Skin: Skin is warm and dry. She is not diaphoretic.  Nursing note and vitals reviewed.    ED Treatments / Results  Labs (all labs ordered are listed, but only abnormal results are displayed) Labs Reviewed  COMPREHENSIVE METABOLIC PANEL - Abnormal; Notable for the following:       Result Value   Potassium 3.4 (*)    Glucose, Bld 156 (*)    Total Protein 8.4 (*)    All other components within normal limits  CBC - Abnormal; Notable for the following:    RBC 5.54 (*)    MCV 73.5 (*)    MCH 24.2 (*)    All other components within normal limits  URINALYSIS, ROUTINE W REFLEX MICROSCOPIC - Abnormal; Notable for the following:    Specific Gravity, Urine 1.032 (*)    Glucose, UA >=500 (*)    Hgb urine dipstick SMALL (*)    Leukocytes, UA SMALL (*)    Bacteria, UA RARE (*)    Squamous Epithelial / LPF 0-5 (*)    All other components within normal limits  LIPASE, BLOOD    EKG  EKG Interpretation None       Radiology Ct Abdomen Pelvis W Contrast  Result Date: 04/22/2016 CLINICAL DATA:  Left-sided pain with vomiting EXAM: CT ABDOMEN AND PELVIS WITH CONTRAST TECHNIQUE: Multidetector CT imaging of the abdomen and pelvis was performed using the standard protocol following bolus administration of intravenous contrast. Oral contrast was also administered. CONTRAST:  168mL ISOVUE-300 IOPAMIDOL (ISOVUE-300) INJECTION 61%, 47mL ISOVUE-300 IOPAMIDOL (ISOVUE-300) INJECTION 61% COMPARISON:  October 19, 2015 FINDINGS: Lower chest: There is  slight bibasilar atelectasis. Hepatobiliary: No focal liver lesions are evident. Gallbladder wall is not appreciably thickened. There is no biliary duct dilatation. Pancreas: There is no pancreatic mass or inflammatory focus. Spleen: No splenic lesions are evident. A small splenule is noted between the greater curvature of the stomach and anterior spleen. Adrenals/Urinary Tract: Adrenals appear normal bilaterally. Kidneys bilaterally show no demonstrable mass or hydronephrosis on either side. Each kidney has an extrarenal pelvis, an anatomic variant. There is no renal or ureteral calculus on either side. Urinary bladder is midline with wall thickness within normal limits. Stomach/Bowel: There is localized fold thickening at the level of the gastro pylorus. There is no other bowel wall thickening. No mesenteric thickening.  No evident bowel obstruction. No free air or portal venous air. There is no evidence of diverticulitis. Vascular/Lymphatic: No abdominal aortic aneurysm. No vascular lesion evident. Incidental note is made of a circumaortic left renal vein, an anatomic variant. There is no appreciable adenopathy in the abdomen or pelvis. Reproductive: Uterus is absent. There is evidence of a hemorrhagic cyst in the right ovary measuring 2.3 x 2.3 cm. No other evidence of pelvic mass. No pelvic fluid collection. Other: Appendix appears normal. No abscess or ascites in the abdomen or pelvis. Musculoskeletal: There is degenerative change at L4-5 with stable 4 mm of anterolisthesis of L4 on L5. There is extensive disc degenerative type change in the endplates at 075-GRM, stable. 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 No blastic or lytic bone lesions are evident. There is no intramuscular or abdominal wall lesion. IMPRESSION: Focal wall thickening at the level of the gastric pylorus, likely indicative of a degree of localized pyloric gastritis. No bowel obstruction. No abscess. No ascites. Collapsed  hemorrhagic cyst right ovary.  Uterus absent. No evidence of diverticulitis. No colonic wall thickening. The etiology for left-sided pain has not been established with this study. Electronically Signed   By: Lowella Grip III M.D.   On: 04/22/2016 10:57    Procedures Procedures (including critical care time)  Medications Ordered in ED Medications  iopamidol (ISOVUE-300) 61 % injection (not administered)  fentaNYL (SUBLIMAZE) injection 50 mcg (50 mcg Intravenous Given 04/22/16 0925)  sodium chloride 0.9 % bolus 500 mL (0 mLs Intravenous Stopped 04/22/16 1010)  ondansetron (ZOFRAN) injection 4 mg (4 mg Intravenous Given 04/22/16 0923)  iopamidol (ISOVUE-300) 61 % injection 30 mL (30 mLs Oral Contrast Given 04/22/16 0930)  potassium chloride SA (K-DUR,KLOR-CON) CR tablet 40 mEq (40 mEq Oral Given 04/22/16 1106)  iopamidol (ISOVUE-300) 61 % injection 100 mL (100 mLs Intravenous Contrast Given 04/22/16 1028)     Initial Impression / Assessment and Plan / ED Course  I have reviewed the triage vital signs and the nursing notes.  Pertinent labs & imaging results that were available during my care of the patient were reviewed by me and considered in my medical decision making (see chart for details).     Patient's nausea and pain are controlled. CT with possible gastritis, doesn't explain pain today, may be related to her likely gastroenteritis. Will treat with zofran and PPI. No signs of bacterial illness. Appears well. Has f/u with GI already scheduled for next week, keep this appointment. Return precautions given.  Final Clinical Impressions(s) / ED Diagnoses   Final diagnoses:  Left lower quadrant abdominal pain of unknown etiology  Gastroenteritis    New Prescriptions New Prescriptions   ONDANSETRON (ZOFRAN ODT) 4 MG DISINTEGRATING TABLET    Take 1 tablet (4 mg total) by mouth every 8 (eight) hours as needed for nausea or vomiting.   PANTOPRAZOLE (PROTONIX) 40 MG TABLET    Take 1  tablet (40 mg total) by mouth daily.     Sherwood Gambler, MD 04/22/16 [phone]

## 2016-04-29 ENCOUNTER — Ambulatory Visit: Payer: Self-pay | Admitting: Internal Medicine

## 2016-05-04 ENCOUNTER — Encounter: Payer: Self-pay | Admitting: Internal Medicine

## 2016-05-04 ENCOUNTER — Ambulatory Visit (INDEPENDENT_AMBULATORY_CARE_PROVIDER_SITE_OTHER): Payer: BLUE CROSS/BLUE SHIELD | Admitting: Internal Medicine

## 2016-05-04 ENCOUNTER — Other Ambulatory Visit (INDEPENDENT_AMBULATORY_CARE_PROVIDER_SITE_OTHER): Payer: BLUE CROSS/BLUE SHIELD

## 2016-05-04 VITALS — BP 134/86 | HR 81 | Temp 98.2°F | Resp 16 | Wt 153.0 lb

## 2016-05-04 DIAGNOSIS — Z794 Long term (current) use of insulin: Secondary | ICD-10-CM

## 2016-05-04 DIAGNOSIS — E038 Other specified hypothyroidism: Secondary | ICD-10-CM

## 2016-05-04 DIAGNOSIS — R310 Gross hematuria: Secondary | ICD-10-CM

## 2016-05-04 DIAGNOSIS — E119 Type 2 diabetes mellitus without complications: Secondary | ICD-10-CM | POA: Diagnosis not present

## 2016-05-04 DIAGNOSIS — I10 Essential (primary) hypertension: Secondary | ICD-10-CM

## 2016-05-04 DIAGNOSIS — N9489 Other specified conditions associated with female genital organs and menstrual cycle: Secondary | ICD-10-CM

## 2016-05-04 DIAGNOSIS — R109 Unspecified abdominal pain: Secondary | ICD-10-CM | POA: Insufficient documentation

## 2016-05-04 DIAGNOSIS — R1032 Left lower quadrant pain: Secondary | ICD-10-CM

## 2016-05-04 DIAGNOSIS — N949 Unspecified condition associated with female genital organs and menstrual cycle: Secondary | ICD-10-CM

## 2016-05-04 LAB — URINALYSIS, ROUTINE W REFLEX MICROSCOPIC
BILIRUBIN URINE: NEGATIVE
HGB URINE DIPSTICK: NEGATIVE
KETONES UR: NEGATIVE
NITRITE: NEGATIVE
PH: 7 (ref 5.0–8.0)
Specific Gravity, Urine: 1.01 (ref 1.000–1.030)
TOTAL PROTEIN, URINE-UPE24: NEGATIVE
URINE GLUCOSE: 500 — AB
Urobilinogen, UA: 0.2 (ref 0.0–1.0)

## 2016-05-04 LAB — COMPREHENSIVE METABOLIC PANEL
ALBUMIN: 4 g/dL (ref 3.5–5.2)
ALK PHOS: 91 U/L (ref 39–117)
ALT: 19 U/L (ref 0–35)
AST: 26 U/L (ref 0–37)
BUN: 11 mg/dL (ref 6–23)
CALCIUM: 9.4 mg/dL (ref 8.4–10.5)
CHLORIDE: 99 meq/L (ref 96–112)
CO2: 32 mEq/L (ref 19–32)
Creatinine, Ser: 0.75 mg/dL (ref 0.40–1.20)
GFR: 101.66 mL/min (ref >60.00)
Glucose, Bld: 184 mg/dL — ABNORMAL HIGH (ref 70–99)
POTASSIUM: 3.4 meq/L — AB (ref 3.5–5.1)
Sodium: 137 mEq/L (ref 135–145)
TOTAL PROTEIN: 7.7 g/dL (ref 6.0–8.3)
Total Bilirubin: 0.5 mg/dL (ref 0.2–1.2)

## 2016-05-04 LAB — CBC WITH DIFFERENTIAL/PLATELET
BASOS PCT: 1.2 % (ref 0.0–3.0)
Basophils Absolute: 0.1 10*3/uL (ref 0.0–0.1)
EOS ABS: 0.2 10*3/uL (ref 0.0–0.7)
EOS PCT: 3.7 % (ref 0.0–5.0)
HEMATOCRIT: 37.2 % (ref 36.0–46.0)
HEMOGLOBIN: 11.8 g/dL — AB (ref 12.0–15.0)
LYMPHS PCT: 40.2 % (ref 12.0–46.0)
Lymphs Abs: 1.8 10*3/uL (ref 0.7–4.0)
MCHC: 31.8 g/dL (ref 30.0–36.0)
MCV: 74.3 fl — ABNORMAL LOW (ref 78.0–100.0)
MONO ABS: 0.3 10*3/uL (ref 0.1–1.0)
Monocytes Relative: 7.4 % (ref 3.0–12.0)
Neutro Abs: 2.1 10*3/uL (ref 1.4–7.7)
Neutrophils Relative %: 47.5 % (ref 43.0–77.0)
Platelets: 373 10*3/uL (ref 150.0–400.0)
RBC: 5.01 Mil/uL (ref 3.87–5.11)
RDW: 13.7 % (ref 11.5–15.5)
WBC: 4.5 10*3/uL (ref 4.0–10.5)

## 2016-05-04 LAB — TSH: TSH: 0.96 u[IU]/mL (ref 0.35–4.50)

## 2016-05-04 LAB — HEMOGLOBIN A1C: Hgb A1c MFr Bld: 7.4 % — ABNORMAL HIGH (ref 4.6–6.5)

## 2016-05-04 MED ORDER — METFORMIN HCL 1000 MG PO TABS: 1000.0000 mg | ORAL_TABLET | Freq: Two times a day (BID) | ORAL | 3 refills | Status: DC

## 2016-05-04 MED ORDER — LEVOTHYROXINE SODIUM 112 MCG PO TABS: 112.0000 ug | ORAL_TABLET | Freq: Every day | ORAL | 1 refills | Status: DC

## 2016-05-04 MED ORDER — TRIAMTERENE-HCTZ 75-50 MG PO TABS: 1.0000 | ORAL_TABLET | Freq: Every morning | ORAL | 5 refills | Status: DC

## 2016-05-04 MED ORDER — JARDIANCE 10 MG PO TABS: 10.0000 mg | ORAL_TABLET | Freq: Every day | ORAL | 5 refills | Status: DC

## 2016-05-04 MED ORDER — INSULIN GLARGINE 300 UNIT/ML ~~LOC~~ SOPN: 10.0000 [IU] | PEN_INJECTOR | Freq: Every day | SUBCUTANEOUS | 5 refills | Status: DC

## 2016-05-04 NOTE — Progress Notes (Signed)
Pre visit review using our clinic review tool, if applicable. No additional management support is needed unless otherwise documented below in the visit note. 

## 2016-05-04 NOTE — Assessment & Plan Note (Signed)
Has seen gyn - no change in ovarian cyst Will continue to follow with gyn

## 2016-05-04 NOTE — Assessment & Plan Note (Signed)
Intermittent pain for months Recent CT A/P showed no explanation for the pain She is having hematuria - will r/o UTI No obvious hernia on exam or CT Has colonoscopy scheduled ? Musculoskeletal - worse with changes in position/movement Symptomatic treatment

## 2016-05-04 NOTE — Assessment & Plan Note (Signed)
Check tsh  Titrate med dose if needed  

## 2016-05-04 NOTE — Assessment & Plan Note (Signed)
Check a1c Encouraged regular exercise - tends not to exercise in colder weather Sugars are well controlled at home Continue current medications at current doses

## 2016-05-04 NOTE — Assessment & Plan Note (Signed)
BP well controlled Current regimen effective and well tolerated Continue current medications at current doses cmp  

## 2016-05-04 NOTE — Assessment & Plan Note (Signed)
No dysuria, intermittent frequency/urgency, left sided abd pain that is intermittent and chronic No stones on recent Ct scan Check UA, UCx to r/o infection

## 2016-05-04 NOTE — Patient Instructions (Addendum)
  Test(s) ordered today. Your results will be released to [facility] (or called to you) after review, usually within 72hours after test completion. If any changes need to be made, you will be notified at that same time.  All other Health Maintenance issues reviewed.   All recommended immunizations and age-appropriate screenings are up-to-date or discussed.  No immunizations administered today.   Medications reviewed and updated.  No changes recommended at this time.  Your prescription(s) have been submitted to your pharmacy. Please take as directed and contact our office if you believe you are having problem(s) with the medication(s).   Please followup in 6 months for a physical

## 2016-05-04 NOTE — Progress Notes (Signed)
Subjective:    Patient ID: Sylvia Conner, female    DOB: Jun 24, 1955, 61 y.o.   MRN: [id number]  HPI The patient is here for follow up.  She went to the ED 04/22/16 for nausea, vomiting and diarrhea.  She was having abdominal pain.  Ct scan showed possible gastritis.  It was thought she likely had gastroenteritis.  She was discharged home with zofran and protonix.  Her symptoms have resolved.   Pain on left side of abdomen:  It is intermittent and comes in flares.  She has the pain intermittently for a long time - several months.  She feels it when she goes from sitting to standing.  When she is walking around the pain is not there.    The pain is worse with changing positions.  She denies any bulge or lumps. She has had some blood in the urine.  Her bowel movements have been normal.  The pain is not currently present.  She has a colonoscopy scheduled for next month.  She just recently had the CT of her A/P.   ? UTI;  She has had some blood in the urine.  She denies dysuria.  She sometimes has frequency and urgency, but not consistently. She denies fevers.   Diabetes: She is taking her medication daily as prescribed. She is compliant with a diabetic diet. She is not exercising regularly - only in warmer weather. She monitors her sugars and they have been running 100's. She checks her feet daily and denies foot lesions. She is not up-to-date with an ophthalmology examination.   Hypothyroidism:  She is taking her medication daily.  She denies any recent changes in energy or weight that are unexplained.   Hypertension: She is taking her medication daily. She is compliant with a low sodium diet.  She denies chest pain, palpitations, edema, shortness of breath and regular headaches. She is not exercising regularly.  She does not monitor her blood pressure at home.     Medications and allergies reviewed with patient and updated if appropriate.  Patient Active Problem List   Diagnosis Date Noted    . Diarrhea 02/05/2016  . Dysuria 01/13/2016  . Pain in joint, shoulder region 10/31/2015  . Adnexal mass 10/31/2015  . HTN (hypertension) 05/24/2011  . DM (diabetes mellitus) ([place]) 05/24/2011  . Hypothyroidism 05/24/2011  . History of hysterectomy 05/24/2011    Current Outpatient Prescriptions on File Prior to Visit  Medication Sig Dispense Refill  . aspirin EC 81 MG tablet Take 81 mg by mouth daily.    . Insulin Glargine (TOUJEO SOLOSTAR) 300 UNIT/ML SOPN Inject 10 Units into the skin at bedtime. 5 pen 1  . JARDIANCE 10 MG TABS tablet Take 10 mg by mouth daily. 30 tablet 5  . levothyroxine (SYNTHROID) 112 MCG tablet Take 1 tablet (112 mcg total) by mouth daily before breakfast. 90 tablet 1  . metFORMIN (GLUCOPHAGE) 1000 MG tablet Take 1 tablet (1,000 mg total) by mouth 2 (two) times daily with a meal. 60 tablet 3  . Multiple Vitamins-Minerals (HAIR SKIN AND NAILS FORMULA PO) Take 1 tablet by mouth daily.    . naproxen (NAPROSYN) 500 MG tablet Take 1 tablet (500 mg total) by mouth 2 (two) times daily. 30 tablet 0  . ondansetron (ZOFRAN ODT) 4 MG disintegrating tablet Take 1 tablet (4 mg total) by mouth every 8 (eight) hours as needed for nausea or vomiting. 6 tablet 0  . pantoprazole (PROTONIX) 40 MG tablet Take 1  tablet (40 mg total) by mouth daily. 14 tablet 0  . triamterene-hydrochlorothiazide (MAXZIDE) 75-50 MG per tablet Take 1 tablet by mouth every morning.      No current facility-administered medications on file prior to visit.     Past Medical History:  Diagnosis Date  . Anemia   . Arthritis   . Diabetes mellitus   . Hypertension   . Thyroid disease   . UTI (lower urinary tract infection)     Past Surgical History:  Procedure Laterality Date  . COLONOSCOPY    . PARTIAL HYSTERECTOMY     1995    Social History   Social History  . Marital status: Married    Spouse name: N/A  . Number of children: N/A  . Years of education: N/A   Social History Main Topics   . Smoking status: Never Smoker  . Smokeless tobacco: Never Used  . Alcohol use No  . Drug use: No  . Sexual activity: Not on file   Other Topics Concern  . Not on file   Social History Narrative  . No narrative on file    Family History  Problem Relation Age of Onset  . Hypertension Mother   . Diabetes Mother   . Diabetes    . Diabetes Sister   . Diabetes Paternal Grandmother     Review of Systems  Constitutional: Negative for fever.  Respiratory: Negative for cough, shortness of breath and wheezing.   Cardiovascular: Negative for chest pain, palpitations and leg swelling.  Gastrointestinal: Positive for abdominal pain (right sidede, intermittent). Negative for blood in stool, constipation, diarrhea and nausea.  Genitourinary: Positive for frequency (occ), hematuria and urgency (occ). Negative for dysuria.  Musculoskeletal: Positive for back pain (from right flank).  Neurological: Positive for light-headedness (soemtimes) and headaches (occ).       Objective:   Vitals:   05/04/16 1057  BP: 134/86  Pulse: 81  Resp: 16  Temp: 98.2 F (36.8 C)   Wt Readings from Last 3 Encounters:  05/04/16 153 lb (69.4 kg)  02/05/16 152 lb (68.9 kg)  01/13/16 150 lb (68 kg)   Body mass index is 27.1 kg/m.   Physical Exam    Constitutional: Appears well-developed and well-nourished. No distress.  HENT:  Head: Normocephalic and atraumatic.  Neck: Neck supple. No tracheal deviation present. No thyromegaly present.  No cervical lymphadenopathy Cardiovascular: Normal rate, regular rhythm and normal heart sounds.   No murmur heard. No carotid bruit .  No edema Pulmonary/Chest: Effort normal and breath sounds normal. No respiratory distress. No has no wheezes. No rales.  Abdomen: soft, non tender, non distended, no obvious hernia in left groin Skin: Skin is warm and dry. Not diaphoretic.  Psychiatric: Normal mood and affect. Behavior is normal.    CT Abd/Pelvis  04/22/16 -   IMPRESSION: Focal wall thickening at the level of the gastric pylorus, likely indicative of a degree of localized pyloric gastritis. No bowel obstruction. No abscess. No ascites.  Collapsed hemorrhagic cyst right ovary.  Uterus absent.  No evidence of diverticulitis. No colonic wall thickening. The etiology for left-sided pain has not been established with this study.   Assessment & Plan:    See Problem List for Assessment and Plan of chronic medical problems.

## 2016-05-05 LAB — URINE CULTURE

## 2016-05-07 ENCOUNTER — Encounter: Payer: Self-pay | Admitting: Emergency Medicine

## 2016-05-19 ENCOUNTER — Other Ambulatory Visit (INDEPENDENT_AMBULATORY_CARE_PROVIDER_SITE_OTHER): Payer: BLUE CROSS/BLUE SHIELD

## 2016-05-19 ENCOUNTER — Telehealth: Payer: Self-pay | Admitting: Emergency Medicine

## 2016-05-19 DIAGNOSIS — R300 Dysuria: Secondary | ICD-10-CM

## 2016-05-19 LAB — URINALYSIS, ROUTINE W REFLEX MICROSCOPIC
BILIRUBIN URINE: NEGATIVE
Ketones, ur: NEGATIVE
NITRITE: NEGATIVE
Specific Gravity, Urine: 1.015 (ref 1.000–1.030)
TOTAL PROTEIN, URINE-UPE24: NEGATIVE
Urine Glucose: NEGATIVE
Urobilinogen, UA: 0.2 (ref 0.0–1.0)
pH: 6 (ref 5.0–8.0)

## 2016-05-19 NOTE — Telephone Encounter (Signed)
Pt called and stated she is still having pain and blood in her urine. It is not getting any better and wants to know what you recommend. Please advise thanks.

## 2016-05-19 NOTE — Telephone Encounter (Signed)
Please advise 

## 2016-05-19 NOTE — Telephone Encounter (Signed)
LVM informing pt

## 2016-05-19 NOTE — Telephone Encounter (Signed)
Urine ordered - needs to give a sample

## 2016-05-20 ENCOUNTER — Telehealth: Payer: Self-pay | Admitting: Internal Medicine

## 2016-05-20 LAB — URINE CULTURE

## 2016-05-20 MED ORDER — NITROFURANTOIN MONOHYD MACRO 100 MG PO CAPS: 100.0000 mg | ORAL_CAPSULE | Freq: Two times a day (BID) | ORAL | 0 refills | Status: DC

## 2016-05-20 NOTE — Telephone Encounter (Signed)
Preliminary results show possible infection - culture still pending. Sent macrobid to pharmacy.

## 2016-05-20 NOTE — Telephone Encounter (Signed)
Patient is calling about her results for a UTI. Can you please follow up, Thank you.

## 2016-05-20 NOTE — Telephone Encounter (Signed)
Would you take a look at the UA and possibly send something in for pt before you head out of town.

## 2016-05-21 NOTE — Telephone Encounter (Signed)
LVM informing pt RX was sent to pharmacy.

## 2016-05-26 ENCOUNTER — Telehealth: Payer: Self-pay | Admitting: *Deleted

## 2016-05-26 NOTE — Telephone Encounter (Signed)
Pt left msg on triage stating her insurance plan has went up on the [place]. Called back back no answer LMOM Needing her to contact her insurance to see what are the alternatives, and let MD know bcz she would not know what her insurance covers...Sylvia Conner

## 2016-05-27 NOTE — Telephone Encounter (Signed)
JARDIANCE 10 MG TABS tablet is the medication that cost to much. I still informed her to call her insurance company to find out what alternatives they will cove. She will call back with that information.

## 2016-06-03 ENCOUNTER — Telehealth: Payer: Self-pay | Admitting: Internal Medicine

## 2016-06-03 NOTE — Telephone Encounter (Signed)
We can retest urine to see if there is an infection.  Jardiance can increase the risk of UTIs and we probably need to stop that and start something else.  She should be seen next week so we can switch her medication.

## 2016-06-03 NOTE — Telephone Encounter (Signed)
Please advise. Please route back to [facility] as I will not be in the office tomorrow.

## 2016-06-03 NOTE — Telephone Encounter (Signed)
Patient states she has finished medication and is still having symptoms of UTI.  Please follow up with patient in regard.

## 2016-06-04 NOTE — Telephone Encounter (Signed)
LVM for pt to call back as soon as possible.    RE: pt needs an appt next week.

## 2016-06-08 ENCOUNTER — Ambulatory Visit (INDEPENDENT_AMBULATORY_CARE_PROVIDER_SITE_OTHER): Payer: BLUE CROSS/BLUE SHIELD | Admitting: Internal Medicine

## 2016-06-08 ENCOUNTER — Encounter: Payer: Self-pay | Admitting: Internal Medicine

## 2016-06-08 ENCOUNTER — Other Ambulatory Visit (INDEPENDENT_AMBULATORY_CARE_PROVIDER_SITE_OTHER): Payer: BLUE CROSS/BLUE SHIELD

## 2016-06-08 VITALS — BP 126/74 | HR 70 | Temp 98.2°F | Resp 16 | Wt 155.0 lb

## 2016-06-08 DIAGNOSIS — E119 Type 2 diabetes mellitus without complications: Secondary | ICD-10-CM | POA: Diagnosis not present

## 2016-06-08 DIAGNOSIS — R3989 Other symptoms and signs involving the genitourinary system: Secondary | ICD-10-CM

## 2016-06-08 DIAGNOSIS — Z794 Long term (current) use of insulin: Secondary | ICD-10-CM

## 2016-06-08 DIAGNOSIS — I10 Essential (primary) hypertension: Secondary | ICD-10-CM | POA: Diagnosis not present

## 2016-06-08 LAB — COMPREHENSIVE METABOLIC PANEL
ALK PHOS: 85 U/L (ref 39–117)
ALT: 13 U/L (ref 0–35)
AST: 21 U/L (ref 0–37)
Albumin: 3.9 g/dL (ref 3.5–5.2)
BUN: 20 mg/dL (ref 6–23)
CHLORIDE: 104 meq/L (ref 96–112)
CO2: 30 mEq/L (ref 19–32)
Calcium: 9.3 mg/dL (ref 8.4–10.5)
Creatinine, Ser: 0.7 mg/dL (ref 0.40–1.20)
GFR: 110.05 mL/min (ref >60.00)
GLUCOSE: 100 mg/dL — AB (ref 70–99)
POTASSIUM: 3.3 meq/L — AB (ref 3.5–5.1)
Sodium: 140 mEq/L (ref 135–145)
TOTAL PROTEIN: 7.3 g/dL (ref 6.0–8.3)
Total Bilirubin: 0.4 mg/dL (ref 0.2–1.2)

## 2016-06-08 LAB — MICROALBUMIN / CREATININE URINE RATIO
CREATININE, U: 144.7 mg/dL
MICROALB/CREAT RATIO: 0.9 mg/g (ref 0.0–30.0)
Microalb, Ur: 1.3 mg/dL (ref 0.0–1.9)

## 2016-06-08 LAB — HEMOGLOBIN A1C: Hgb A1c MFr Bld: 7.2 % — ABNORMAL HIGH (ref 4.6–6.5)

## 2016-06-08 NOTE — Assessment & Plan Note (Signed)
Sugars well controlled 97-118 at home Check a1c Continue metformin and toujeo Continue diabetic diet and exercise Will see if we need to adjust medication -- will increase toujeo to 12-14 units if needed

## 2016-06-08 NOTE — Progress Notes (Signed)
Subjective:    Patient ID: Sylvia Conner, female    DOB: [birth year], 61 y.o.   MRN: [id number]  HPI She is here for follow up.  Diabetes: She stopped taking the jardiance - it was too expensive.  She is taking her metformin and toujeo.  Her sugars at home have been 97-118.  She is eating healthy, but cheats sometimes.  She is exercising.    Her urine has been pink colored - not every time she urinates, but 3-4 times a week.  She denies dysuria, abdominal pain, fever and nausea.  She has urgency at times.  She completed nitrofuratoin a couple of weeks ago.    Hypertension: She is taking her medication daily. She is compliant with a low sodium diet.  She denies chest pain, palpitations, edema, shortness of breath and regular headaches. She is exercising regularly.  She does not monitor her blood pressure at home.     Medications and allergies reviewed with patient and updated if appropriate.  Patient Active Problem List   Diagnosis Date Noted  . Gross hematuria 05/04/2016  . Left lower quadrant pain 05/04/2016  . Pain in joint, shoulder region 10/31/2015  . Adnexal mass 10/31/2015  . HTN (hypertension) 05/24/2011  . DM (diabetes mellitus) ([place]) 05/24/2011  . Hypothyroidism 05/24/2011  . History of hysterectomy 05/24/2011    Current Outpatient Prescriptions on File Prior to Visit  Medication Sig Dispense Refill  . aspirin EC 81 MG tablet Take 81 mg by mouth daily.    . Insulin Glargine (TOUJEO SOLOSTAR) 300 UNIT/ML SOPN Inject 10 Units into the skin at bedtime. 3 pen 5  . levothyroxine (SYNTHROID) 112 MCG tablet Take 1 tablet (112 mcg total) by mouth daily before breakfast. 30 tablet 1  . metFORMIN (GLUCOPHAGE) 1000 MG tablet Take 1 tablet (1,000 mg total) by mouth 2 (two) times daily with a meal. 60 tablet 3  . Multiple Vitamins-Minerals (HAIR SKIN AND NAILS FORMULA PO) Take 1 tablet by mouth daily.    . nitrofurantoin, macrocrystal-monohydrate, (MACROBID) 100 MG capsule Take 1  capsule (100 mg total) by mouth 2 (two) times daily. 14 capsule 0  . triamterene-hydrochlorothiazide (MAXZIDE) 75-50 MG tablet Take 1 tablet by mouth every morning. 30 tablet 5  . JARDIANCE 10 MG TABS tablet Take 10 mg by mouth daily. (Patient not taking: Reported on 06/08/2016) 30 tablet 5   No current facility-administered medications on file prior to visit.     Past Medical History:  Diagnosis Date  . Anemia   . Arthritis   . Diabetes mellitus   . Hypertension   . Thyroid disease   . UTI (lower urinary tract infection)     Past Surgical History:  Procedure Laterality Date  . COLONOSCOPY    . PARTIAL HYSTERECTOMY     1995    Social History   Social History  . Marital status: Married    Spouse name: N/A  . Number of children: N/A  . Years of education: N/A   Social History Main Topics  . Smoking status: Never Smoker  . Smokeless tobacco: Never Used  . Alcohol use No  . Drug use: No  . Sexual activity: Not Asked   Other Topics Concern  . None   Social History Narrative  . None    Family History  Problem Relation Age of Onset  . Hypertension Mother   . Diabetes Mother   . Diabetes    . Diabetes Sister   . Diabetes Paternal  Grandmother     Review of Systems  Constitutional: Negative for chills and fever.  Respiratory: Negative for cough, shortness of breath and wheezing.   Cardiovascular: Negative for chest pain, palpitations and leg swelling.  Gastrointestinal: Negative for abdominal pain and nausea.  Genitourinary: Positive for hematuria (pink colored urine). Negative for dysuria, frequency and urgency (occ).  Neurological: Negative for light-headedness and headaches.       Objective:   Vitals:   06/08/16 1617  BP: 126/74  Pulse: 70  Resp: 16  Temp: 98.2 F (36.8 C)   Filed Weights   06/08/16 1617  Weight: 155 lb (70.3 kg)   Body mass index is 27.46 kg/m.  Wt Readings from Last 3 Encounters:  06/08/16 155 lb (70.3 kg)  05/04/16 153 lb  (69.4 kg)  02/05/16 152 lb (68.9 kg)     Physical Exam Constitutional: Appears well-developed and well-nourished. No distress.  HENT:  Head: Normocephalic and atraumatic.  Neck: Neck supple. No tracheal deviation present. No thyromegaly present.  No cervical lymphadenopathy Cardiovascular: Normal rate, regular rhythm and normal heart sounds.   No murmur heard. No carotid bruit .  No edema Pulmonary/Chest: Effort normal and breath sounds normal. No respiratory distress. No has no wheezes. No rales.  Abdomen: soft, non tender Skin: Skin is warm and dry. Not diaphoretic.  Psychiatric: Normal mood and affect. Behavior is normal.         Assessment & Plan:   See Problem List for Assessment and Plan of chronic medical problems.

## 2016-06-08 NOTE — Progress Notes (Signed)
Pre visit review using our clinic review tool, if applicable. No additional management support is needed unless otherwise documented below in the visit note. 

## 2016-06-08 NOTE — Assessment & Plan Note (Signed)
BP well controlled Current regimen effective and well tolerated Continue current medications at current doses  

## 2016-06-08 NOTE — Assessment & Plan Note (Signed)
Pink colored - has had hematuria Check UA, UCx - rule out infection Just completed antibiotics Will likely need to see urology for further evaluation

## 2016-06-08 NOTE — Patient Instructions (Addendum)
  Test(s) ordered today. Your results will be released to MyChart (or called to you) after review, usually within 72hours after test completion. If any changes need to be made, you will be notified at that same time.  Medications reviewed and updated.  No changes recommended at this time.    Please followup in 6 months   

## 2016-06-09 LAB — URINALYSIS, ROUTINE W REFLEX MICROSCOPIC
Bilirubin Urine: NEGATIVE
Hgb urine dipstick: NEGATIVE
KETONES UR: NEGATIVE
LEUKOCYTES UA: NEGATIVE
Nitrite: NEGATIVE
RBC / HPF: NONE SEEN (ref 0–?)
SPECIFIC GRAVITY, URINE: 1.025 (ref 1.000–1.030)
TOTAL PROTEIN, URINE-UPE24: NEGATIVE
Urine Glucose: NEGATIVE
Urobilinogen, UA: 0.2 (ref 0.0–1.0)
pH: 6 (ref 5.0–8.0)

## 2016-06-10 LAB — URINE CULTURE

## 2016-06-13 ENCOUNTER — Other Ambulatory Visit: Payer: Self-pay | Admitting: Internal Medicine

## 2016-06-13 MED ORDER — INSULIN GLARGINE 300 UNIT/ML ~~LOC~~ SOPN: 12.0000 [IU] | PEN_INJECTOR | Freq: Every day | SUBCUTANEOUS | 5 refills | Status: DC

## 2016-06-13 MED ORDER — POTASSIUM CHLORIDE CRYS ER 20 MEQ PO TBCR: 20.0000 meq | EXTENDED_RELEASE_TABLET | Freq: Every day | ORAL | 3 refills | Status: DC

## 2016-06-16 ENCOUNTER — Encounter: Payer: Self-pay | Admitting: Emergency Medicine

## 2016-07-21 ENCOUNTER — Other Ambulatory Visit: Payer: Self-pay | Admitting: Internal Medicine

## 2016-08-31 ENCOUNTER — Emergency Department (HOSPITAL_COMMUNITY)
Admission: EM | Admit: 2016-08-31 | Discharge: 2016-08-31 | Disposition: A | Discharge status: Home / Self Care | Payer: Worker's Compensation | Attending: Emergency Medicine | Admitting: Emergency Medicine

## 2016-08-31 ENCOUNTER — Encounter (HOSPITAL_COMMUNITY): Payer: Self-pay

## 2016-08-31 DIAGNOSIS — S61011A Laceration without foreign body of right thumb without damage to nail, initial encounter: Secondary | ICD-10-CM | POA: Diagnosis not present

## 2016-08-31 DIAGNOSIS — Y990 Civilian activity done for income or pay: Secondary | ICD-10-CM | POA: Insufficient documentation

## 2016-08-31 DIAGNOSIS — I10 Essential (primary) hypertension: Secondary | ICD-10-CM | POA: Insufficient documentation

## 2016-08-31 DIAGNOSIS — Z7982 Long term (current) use of aspirin: Secondary | ICD-10-CM | POA: Insufficient documentation

## 2016-08-31 DIAGNOSIS — Z794 Long term (current) use of insulin: Secondary | ICD-10-CM | POA: Insufficient documentation

## 2016-08-31 DIAGNOSIS — Y9389 Activity, other specified: Secondary | ICD-10-CM | POA: Diagnosis not present

## 2016-08-31 DIAGNOSIS — S6991XA Unspecified injury of right wrist, hand and finger(s), initial encounter: Secondary | ICD-10-CM | POA: Diagnosis present

## 2016-08-31 DIAGNOSIS — Z23 Encounter for immunization: Secondary | ICD-10-CM | POA: Insufficient documentation

## 2016-08-31 DIAGNOSIS — W268XXA Contact with other sharp object(s), not elsewhere classified, initial encounter: Secondary | ICD-10-CM | POA: Insufficient documentation

## 2016-08-31 DIAGNOSIS — E119 Type 2 diabetes mellitus without complications: Secondary | ICD-10-CM | POA: Diagnosis not present

## 2016-08-31 DIAGNOSIS — E039 Hypothyroidism, unspecified: Secondary | ICD-10-CM | POA: Insufficient documentation

## 2016-08-31 DIAGNOSIS — Y929 Unspecified place or not applicable: Secondary | ICD-10-CM | POA: Insufficient documentation

## 2016-08-31 MED ORDER — LIDOCAINE HCL (PF) 1 % IJ SOLN
5.0000 mL | Freq: Once | INTRAMUSCULAR | Status: AC
  Administered 2016-08-31: 5 mL via INTRADERMAL
  Filled 2016-08-31: qty 5

## 2016-08-31 MED ORDER — HYDROCODONE-ACETAMINOPHEN 5-325 MG PO TABS
1.0000 | ORAL_TABLET | ORAL | 0 refills | Status: DC | PRN
Start: 2016-08-31 — End: 2016-12-08

## 2016-08-31 MED ORDER — TETANUS-DIPHTH-ACELL PERTUSSIS 5-2.5-18.5 LF-MCG/0.5 IM SUSP
0.5000 mL | Freq: Once | INTRAMUSCULAR | Status: AC
  Administered 2016-08-31: 0.5 mL via INTRAMUSCULAR
  Filled 2016-08-31: qty 0.5

## 2016-08-31 MED ORDER — HYDROCODONE-ACETAMINOPHEN 5-325 MG PO TABS
2.0000 | ORAL_TABLET | Freq: Once | ORAL | Status: AC
  Administered 2016-08-31: 2 via ORAL
  Filled 2016-08-31: qty 2

## 2016-08-31 MED ORDER — CLINDAMYCIN HCL 300 MG PO CAPS: 300.0000 mg | ORAL_CAPSULE | Freq: Four times a day (QID) | ORAL | 0 refills | Status: AC

## 2016-08-31 NOTE — ED Notes (Signed)
Pt and husband sitting in waiting room at this time. Pt asked about care. This RN made couple aware and placed patient back in the system. Pt

## 2016-08-31 NOTE — Consult Note (Signed)
Reason for Consult:laceration Referring Physician: ER  CC:I cut my thumb  HPI:  Sylvia Conner is an 61 y.o. right handed female who presents with laceration of Right thumb.  Pt was trying to install a razor blade on a device and cut her thumb at work.     [facility].   Pain is rated at    7/10 and is described as sharp.  Pain is constant.  Pain is made better by rest/immobilization, worse with motion.   Associated signs/symptoms: active bleeding Previous treatment:  n/a  Past Medical History:  Diagnosis Date  . Anemia   . Arthritis   . Diabetes mellitus   . Hypertension   . Thyroid disease   . UTI (lower urinary tract infection)     Past Surgical History:  Procedure Laterality Date  . COLONOSCOPY    . PARTIAL HYSTERECTOMY     1995    Family History  Problem Relation Age of Onset  . Hypertension Mother   . Diabetes Mother   . Diabetes Unknown   . Diabetes Sister   . Diabetes Paternal Grandmother     Social History:  reports that she has never smoked. She has never used smokeless tobacco. She reports that she does not drink alcohol or use drugs.  Allergies:  Allergies  Allergen Reactions  . Penicillins Anaphylaxis and Rash    Has patient had a PCN reaction causing immediate rash, facial/tongue/throat swelling, SOB or lightheadedness with hypotension: Yes Has patient had a PCN reaction causing severe rash involving mucus membranes or skin necrosis: No Has patient had a PCN reaction that required hospitalization Yes Has patient had a PCN reaction occurring within the last 10 years: No If all of the above answers are "NO", then may proceed with Cephalosporin use.   Sylvia Conner [Sitagliptin] Nausea Only  . Scallops [Shellfish Allergy] Hives    Medications: I have reviewed the patient's current medications.  No results found for this or any previous visit (from the past 48 hour(s)).  No results found.  Pertinent items are noted in  HPI. Temp:  [97.9 F (36.6 C)] 97.9 F (36.6 C) (05/29 1611) Pulse Rate:  [72] 72 (05/29 1611) Resp:  [18] 18 (05/29 1611) BP: (144)/(87) 144/87 (05/29 1611) SpO2:  [99 %] 99 % (05/29 1611) Weight:  [69.4 kg (153 lb)] 69.4 kg (153 lb) (05/29 1611) General appearance: alert and cooperative Resp: clear to auscultation bilaterally Cardio: regular rate and rhythm GI: soft, non-tender; bowel sounds normal; no masses,  no organomegaly Extremities: extremities normal, atraumatic, no cyanosis or edema Except for R thumb, large 3cm laceration to dorsal thumb from mcpj to dipj with flap of skin, tendon exposed; extension good even with resistance  Assessment: Laceration R thumb, ? Tendon injury, vessel injury Plan: Will anesthetize, explore, repair I have discussed this treatment plan in detail with patient and family, including the risks of the recommended treatment or surgery, the benefits and the alternatives.  The patient and caregiver understands that additional treatment may be necessary.  Procedure:  Thumb cleansed, local infiltrated at base of thumb, a tourniqot was used.  The wound was explored, the extensor tendon was lacerated ~15%; the wound was irrigated with saline, one stitch was placed in the tendon, the skin was closed with 4-0 prolene, a sterile dressing was applied.    Sylvia Bible 08/31/2016, 5:43 PM

## 2016-08-31 NOTE — ED Notes (Signed)
Called X2 no response.  

## 2016-08-31 NOTE — ED Notes (Signed)
Called Multiple times for triage. No response.

## 2016-08-31 NOTE — ED Triage Notes (Signed)
Pt presents to the ed for a laceration on her right thumb that is deep with a large amount of bleeding, bleeding controlled in triage, pt denies any other symptoms

## 2016-08-31 NOTE — ED Provider Notes (Signed)
[facility] DEPT Provider Note   CSN: [id number] Arrival date & time: 08/31/16  4270   By signing my name below, I, Fabian Sharp, attest that this documentation has been prepared under the direction and in the presence of Duffy Bruce, MD . Electronically Signed: Fabian Sharp, ED Scribe. 08/31/2016. 4:57 PM.  History   Chief Complaint Chief Complaint  Patient presents with  . Finger Injury    HPI Sylvia Conner is a right-hand dominant  61 y.o. female with a history of DM and HTN who presents to the Emergency Department complaining of a right thumb injury occurring 2.5 hours ago. She notes associated laceration to right thumb and right thumb pain. 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 Pt tried applying pressure to the wound without medications and no relief of her symptoms. Pt denies swelling, color change, and any other acute symptoms.     The history is provided by the patient. No language interpreter was used.    Past Medical History:  Diagnosis Date  . Anemia   . Arthritis   . Diabetes mellitus   . Hypertension   . Thyroid disease   . UTI (lower urinary tract infection)     Patient Active Problem List   Diagnosis Date Noted  . Urine discoloration 06/08/2016  . Gross hematuria 05/04/2016  . Left lower quadrant pain 05/04/2016  . Pain in joint, shoulder region 10/31/2015  . Adnexal mass 10/31/2015  . HTN (hypertension) 05/24/2011  . DM (diabetes mellitus) ([place]) 05/24/2011  . Hypothyroidism 05/24/2011  . History of hysterectomy 05/24/2011    Past Surgical History:  Procedure Laterality Date  . COLONOSCOPY    . PARTIAL HYSTERECTOMY     1995    OB History    No data available       Home Medications    Prior to Admission medications   Medication Sig Start Date End Date Taking? Authorizing Provider    aspirin EC 81 MG tablet Take 81 mg by mouth daily.    [provider]  clindamycin (CLEOCIN) 300 MG capsule Take 1 capsule (300 mg total) by mouth 4 (four) times daily. 08/31/16 09/07/16  Duffy Bruce, MD  HYDROcodone-acetaminophen (NORCO/VICODIN) 5-325 MG tablet Take 1-2 tablets by mouth every 4 (four) hours as needed for moderate pain or severe pain. 08/31/16   Duffy Bruce, MD  Insulin Glargine (TOUJEO SOLOSTAR) 300 UNIT/ML SOPN Inject 12 Units into the skin at bedtime. 06/13/16   Binnie Rail, MD  levothyroxine (SYNTHROID, LEVOTHROID) 112 MCG tablet TAKE ONE TABLET BY MOUTH ONCE DAILY BEFORE BREAKFAST 07/22/16   Binnie Rail, MD  metFORMIN (GLUCOPHAGE) 1000 MG tablet Take 1 tablet (1,000 mg total) by mouth 2 (two) times daily with a meal. 05/04/16   Burns, Claudina Lick, MD  Multiple Vitamins-Minerals (HAIR SKIN AND NAILS FORMULA PO) Take 1 tablet by mouth daily.    [provider]  potassium chloride SA (K-DUR,KLOR-CON) 20 MEQ tablet Take 1 tablet (20 mEq total) by mouth daily. 06/13/16   Binnie Rail, MD  triamterene-hydrochlorothiazide (MAXZIDE) 75-50 MG tablet Take 1 tablet by mouth every morning. 05/04/16   Binnie Rail, MD    Family History Family History  Problem Relation Age of Onset  . Hypertension Mother   . Diabetes Mother   . Diabetes Unknown   . Diabetes Sister   .  Diabetes Paternal Grandmother     Social History Social History  Substance Use Topics  . Smoking status: Never Smoker  . Smokeless tobacco: Never Used  . Alcohol use No     Allergies   Penicillins; Januvia [sitagliptin]; and Scallops [shellfish allergy]   Review of Systems Review of Systems  Musculoskeletal: Positive for arthralgias (right thumb). Negative for joint swelling.  Skin: Positive for wound (laceration to right thumb). Negative for color change.  All other systems reviewed and are negative.    Physical Exam Updated Vital Signs BP (!) 144/87   Pulse 72   Temp 97.9  F (36.6 C) (Oral)   Resp 18   Ht 5\' 2"  (1.575 m)   Wt 153 lb (69.4 kg)   SpO2 99%   BMI 27.98 kg/m   Physical Exam  Constitutional: She is oriented to person, place, and time. She appears well-developed and well-nourished. No distress.  HENT:  Head: Normocephalic and atraumatic.  Eyes: Conjunctivae are normal.  Neck: Normal range of motion. Neck supple.  Cardiovascular: Normal rate, regular rhythm and normal heart sounds.   Pulmonary/Chest: Effort normal. No respiratory distress. She has no wheezes.  Abdominal: She exhibits no distension.  Musculoskeletal: Normal range of motion. She exhibits no edema.  See picture below.  Neurological: She is alert and oriented to person, place, and time. Cranial nerve deficit: .scribe. She exhibits normal muscle tone.  Skin: Skin is warm. Capillary refill takes less than 2 seconds. No rash noted. No pallor.  Psychiatric: She has a normal mood and affect. Her behavior is normal.  Nursing note and vitals reviewed.   UPPER EXTREMITY EXAM: RIGHT  INSPECTION & PALPATION: Linear, deep laceration extending across dorsal aspect of thumb, with apparent extension to the dorsal joint space and tendons. Pulsatile arterial bleeding noted when tourniquet not in place. Patient does seem to be able to extend her thumb at the IP joint, though limited. Diminished sensation distal to injury.       ED Treatments / Results  DIAGNOSTIC STUDIES:  Oxygen Saturation is 99% on RA, normal by my interpretation.    COORDINATION OF CARE:  4:57 PM Discussed treatment plan with pt at bedside and pt agreed to plan.  4:59 PM- consult with Dr. Lenon Curt who will evaluate in ED   Labs (all labs ordered are listed, but only abnormal results are displayed) Labs Reviewed - No data to display  EKG  EKG Interpretation None       Radiology No results found.  Procedures Procedures (including critical care time)  Medications Ordered in ED Medications  Tdap  (BOOSTRIX) injection 0.5 mL (0.5 mLs Intramuscular Given 08/31/16 1705)  HYDROcodone-acetaminophen (NORCO/VICODIN) 5-325 MG per tablet 2 tablet (2 tablets Oral Given 08/31/16 1705)  lidocaine (PF) (XYLOCAINE) 1 % injection 5 mL (5 mLs Intradermal Given 08/31/16 1719)     Initial Impression / Assessment and Plan / ED Course  I have reviewed the triage vital signs and the nursing notes.  Pertinent labs & imaging results that were available during my care of the patient were reviewed by me and considered in my medical decision making (see chart for details).     61 yo F with PMHx as above, RHD, here with deep laceration to thumb from razor blade. VSS. Exam as above, c/f laceration of extensor tendons, dorsal and ulnar digital arteries. Dr. Lenon Curt consulted and closed at bedside. Pt to be d/c on ABX, analgesia, with good outpt follow-up. Pt in agreement.  Final Clinical  Impressions(s) / ED Diagnoses   Final diagnoses:  Laceration of right thumb without foreign body without damage to nail, initial encounter    New Prescriptions Discharge Medication List as of 08/31/2016  6:00 PM    START taking these medications   Details  clindamycin (CLEOCIN) 300 MG capsule Take 1 capsule (300 mg total) by mouth 4 (four) times daily., Starting Tue 08/31/2016, Until Tue 09/07/2016, Print    HYDROcodone-acetaminophen (NORCO/VICODIN) 5-325 MG tablet Take 1-2 tablets by mouth every 4 (four) hours as needed for moderate pain or severe pain., Starting Tue 08/31/2016, Print        I personally performed the services described in this documentation, which was scribed in my presence. The recorded information has been reviewed and is accurate.      Duffy Bruce, MD 09/01/16 1030

## 2016-10-21 ENCOUNTER — Telehealth: Payer: Self-pay | Admitting: Emergency Medicine

## 2016-10-21 NOTE — Telephone Encounter (Signed)
Left a message with pts husband to have pt call office back, needing to verify if she is requesting glucose monitoring supplies from [facility] or from [facility].

## 2016-11-02 ENCOUNTER — Emergency Department (HOSPITAL_COMMUNITY)
Admission: EM | Admit: 2016-11-02 | Discharge: 2016-11-03 | Disposition: A | Discharge status: Home / Self Care | Payer: BLUE CROSS/BLUE SHIELD | Attending: Emergency Medicine | Admitting: Emergency Medicine

## 2016-11-02 ENCOUNTER — Encounter (HOSPITAL_COMMUNITY): Payer: Self-pay | Admitting: Emergency Medicine

## 2016-11-02 DIAGNOSIS — Z794 Long term (current) use of insulin: Secondary | ICD-10-CM | POA: Diagnosis not present

## 2016-11-02 DIAGNOSIS — E039 Hypothyroidism, unspecified: Secondary | ICD-10-CM | POA: Diagnosis not present

## 2016-11-02 DIAGNOSIS — M25571 Pain in right ankle and joints of right foot: Secondary | ICD-10-CM

## 2016-11-02 DIAGNOSIS — Z79899 Other long term (current) drug therapy: Secondary | ICD-10-CM | POA: Diagnosis not present

## 2016-11-02 DIAGNOSIS — E119 Type 2 diabetes mellitus without complications: Secondary | ICD-10-CM | POA: Insufficient documentation

## 2016-11-02 DIAGNOSIS — I10 Essential (primary) hypertension: Secondary | ICD-10-CM | POA: Insufficient documentation

## 2016-11-02 DIAGNOSIS — Z7982 Long term (current) use of aspirin: Secondary | ICD-10-CM | POA: Diagnosis not present

## 2016-11-02 NOTE — ED Triage Notes (Signed)
Pt is c/o right ankle pain that started last Friday  Pt denies injury  Pt states she has swelling  Pt has used ice, warm soaks, ace wrap, etc without relief  Pt states the pain has started radiating up her leg today

## 2016-11-02 NOTE — ED Provider Notes (Signed)
[facility] DEPT Provider Note   CSN: [id number] Arrival date & time: 11/02/16  4656  By signing my name below, I, Reola Mosher, attest that this documentation has been prepared under the direction and in the presence of [place], Thedora Rings, MD. Electronically Signed: Reola Mosher, ED Scribe. 11/02/16. 11:51 PM.  History   Chief Complaint Chief Complaint  Patient presents with  . Ankle Pain   The history is provided by the patient. No language interpreter was used.  Ankle Pain   The incident occurred more than 2 days ago. The incident occurred at home. There was no injury mechanism. The pain is present in the right ankle. The pain is moderate. The pain has been constant since onset. Pertinent negatives include no numbness, no loss of motion, no loss of sensation and no tingling. She reports no foreign bodies present. The symptoms are aggravated by bearing weight. She has tried ice and heat for the symptoms. The treatment provided no relief.    HPI Comments: Sylvia Conner is a 61 y.o. female w/ a h/o HTN, anemia, DM, who presents to the Emergency Department complaining of gradual onset, worsening right ankle pain beginning four days ago. Pt reports that four days ago she had an acute onset of atraumatic right ankle pain and it has been worsening since. No injury to the ankle. Pt has been taking ASA and applying ice and soaking in hot water without relief of the pain at home. Her pain is worse with ambulation. She denies numbness, weakness, rash, or any other associated symptoms.   Past Medical History:  Diagnosis Date  . Anemia   . Arthritis   . Diabetes mellitus   . Hypertension   . Thyroid disease   . UTI (lower urinary tract infection)    Patient Active Problem List   Diagnosis Date Noted  . Urine discoloration 06/08/2016  . Gross hematuria 05/04/2016  . Left lower quadrant pain 05/04/2016  . Pain in joint, shoulder region 10/31/2015  . Adnexal mass 10/31/2015  .  HTN (hypertension) 05/24/2011  . DM (diabetes mellitus) ([place]) 05/24/2011  . Hypothyroidism 05/24/2011  . History of hysterectomy 05/24/2011   Past Surgical History:  Procedure Laterality Date  . CESAREAN SECTION    . COLONOSCOPY    . PARTIAL HYSTERECTOMY     1995   OB History    No data available     Home Medications    Prior to Admission medications   Medication Sig Start Date End Date Taking? Authorizing Provider  aspirin EC 81 MG tablet Take 81 mg by mouth daily.    [provider]  HYDROcodone-acetaminophen (NORCO/VICODIN) 5-325 MG tablet Take 1-2 tablets by mouth every 4 (four) hours as needed for moderate pain or severe pain. 08/31/16   Duffy Bruce, MD  Insulin Glargine (TOUJEO SOLOSTAR) 300 UNIT/ML SOPN Inject 12 Units into the skin at bedtime. 06/13/16   Binnie Rail, MD  levothyroxine (SYNTHROID, LEVOTHROID) 112 MCG tablet TAKE ONE TABLET BY MOUTH ONCE DAILY BEFORE BREAKFAST 07/22/16   Binnie Rail, MD  metFORMIN (GLUCOPHAGE) 1000 MG tablet Take 1 tablet (1,000 mg total) by mouth 2 (two) times daily with a meal. 05/04/16   Burns, Claudina Lick, MD  Multiple Vitamins-Minerals (HAIR SKIN AND NAILS FORMULA PO) Take 1 tablet by mouth daily.    [provider]  potassium chloride SA (K-DUR,KLOR-CON) 20 MEQ tablet Take 1 tablet (20 mEq total) by mouth daily. 06/13/16   Binnie Rail, MD  triamterene-hydrochlorothiazide (  MAXZIDE) 75-50 MG tablet Take 1 tablet by mouth every morning. 05/04/16   Binnie Rail, MD   Family History Family History  Problem Relation Age of Onset  . Hypertension Mother   . Diabetes Mother   . Diabetes Unknown   . Diabetes Sister   . Diabetes Paternal Grandmother    Social History Social History  Substance Use Topics  . Smoking status: Never Smoker  . Smokeless tobacco: Never Used  . Alcohol use No   Allergies   Penicillins; Januvia [sitagliptin]; and Scallops [shellfish allergy]  Review of Systems Review of Systems    Constitutional: Negative for appetite change, chills and fever.  HENT: Negative for drooling and facial swelling.   Eyes: Negative for photophobia.  Respiratory: Negative for shortness of breath.   Cardiovascular: Negative for chest pain, palpitations and leg swelling.  Gastrointestinal: Negative for anal bleeding.  Genitourinary: Negative for difficulty urinating.  Musculoskeletal: Positive for arthralgias and myalgias. Negative for neck stiffness.  Skin: Negative for pallor and rash.  Neurological: Negative for tingling, facial asymmetry, speech difficulty, weakness and numbness.  Psychiatric/Behavioral: Negative for suicidal ideas.  All other systems reviewed and are negative.  Physical Exam Updated Vital Signs BP 121/80 (BP Location: Left Arm)   Pulse 79   Temp 98.4 F (36.9 C) (Oral)   Resp 16   SpO2 100%   Physical Exam  Constitutional: She appears well-developed and well-nourished.  HENT:  Head: Normocephalic.  Mouth/Throat: Oropharynx is clear and moist. No oropharyngeal exudate.  Eyes: Pupils are equal, round, and reactive to light. Conjunctivae and EOM are normal. Right eye exhibits no discharge. Left eye exhibits no discharge. No scleral icterus.  Neck: Normal range of motion. Neck supple. No JVD present. No tracheal deviation present.  Trachea is midline. No stridor or carotid bruits.   Cardiovascular: Normal rate, regular rhythm, normal heart sounds and intact distal pulses.   No murmur heard. Pulmonary/Chest: Effort normal and breath sounds normal. No stridor. No respiratory distress. She has no wheezes. She has no rales.  Lungs CTA bilaterally.  Abdominal: Soft. Bowel sounds are normal. She exhibits no distension. There is no tenderness. There is no rebound and no guarding.  Musculoskeletal: Normal range of motion. She exhibits no edema or tenderness.  All compartments are soft. No palpable cords. All ligaments intact to the RLE. Fibrosis over the skin overlying  an area of eczema to the right instep.   Lymphadenopathy:    She has no cervical adenopathy.  Neurological: She is alert. She has normal reflexes. She displays normal reflexes. She exhibits normal muscle tone.  Skin: Skin is warm and dry. Capillary refill takes less than 2 seconds.  Psychiatric: She has a normal mood and affect. Her behavior is normal.  Nursing note and vitals reviewed.  ED Treatments / Results  DIAGNOSTIC STUDIES: Oxygen Saturation is 100% on RA, normal by my interpretation.   COORDINATION OF CARE: 11:49 PM-Discussed next steps with pt. Pt verbalized understanding and is agreeable with the plan.   Radiology  Results for orders placed or performed in visit on 06/08/16  Urine Culture  Result Value Ref Range   Organism ID, Bacteria      Three or more organisms present,each greater than 10,000 CFU/mL.These organisms,commonly found on external and internal genitalia,are considered to be colonizers.No further testing performed.   Comprehensive metabolic panel  Result Value Ref Range   Sodium 140 135 - 145 mEq/L   Potassium 3.3 (L) 3.5 - 5.1 mEq/L  Chloride 104 96 - 112 mEq/L   CO2 30 19 - 32 mEq/L   Glucose, Bld 100 (H) 70 - 99 mg/dL   BUN 20 6 - 23 mg/dL   Creatinine, Ser 0.70 0.40 - 1.20 mg/dL   Total Bilirubin 0.4 0.2 - 1.2 mg/dL   Alkaline Phosphatase 85 39 - 117 U/L   AST 21 0 - 37 U/L   ALT 13 0 - 35 U/L   Total Protein 7.3 6.0 - 8.3 g/dL   Albumin 3.9 3.5 - 5.2 g/dL   Calcium 9.3 8.4 - 10.5 mg/dL   GFR 110.05 >60.00 mL/min  Hemoglobin A1c  Result Value Ref Range   Hgb A1c MFr Bld 7.2 (H) 4.6 - 6.5 %  Urine Microalbumin w/creat. ratio  Result Value Ref Range   Microalb, Ur 1.3 0.0 - 1.9 mg/dL   Creatinine,U 144.7 mg/dL   Microalb Creat Ratio 0.9 0.0 - 30.0 mg/g  Urinalysis, Routine w reflex microscopic  Result Value Ref Range   Color, Urine YELLOW Yellow;Lt. Yellow   APPearance CLEAR Clear   Specific Gravity, Urine 1.025 1.000 - 1.030   pH  6.0 5.0 - 8.0   Total Protein, Urine NEGATIVE Negative   Urine Glucose NEGATIVE Negative   Ketones, ur NEGATIVE Negative   Bilirubin Urine NEGATIVE Negative   Hgb urine dipstick NEGATIVE Negative   Urobilinogen, UA 0.2 0.0 - 1.0   Leukocytes, UA NEGATIVE Negative   Nitrite NEGATIVE Negative   WBC, UA 0-2/hpf 0-2/hpf   RBC / HPF none seen 0-2/hpf   Squamous Epithelial / LPF Rare(0-4/hpf) Rare(0-4/hpf)   Ca Oxalate Crys, UA Presence of (A) None   Dg Ankle Complete Right  Result Date: 11/03/2016 CLINICAL DATA:  Known injury. Pain and swelling in the right ankle beginning 4 days ago. Medial ankle is red and swollen. EXAM: RIGHT ANKLE - COMPLETE 3+ VIEW COMPARISON:  None. FINDINGS: Soft tissue swelling over the medial aspect of the right ankle. No evidence of acute fracture or dislocation. No focal bone lesion or bone destruction. No evidence of osteomyelitis. No radiopaque soft tissue foreign bodies. Degenerative changes in the intertarsal joints. Small plantar calcaneal spur. IMPRESSION: Soft tissue swelling over the medial aspect of the right ankle. No acute bony abnormalities. Electronically Signed   By: Lucienne Capers M.D.   On: 11/03/2016 00:37    Procedures Procedures   Medications Ordered in ED  Medications  naproxen (NAPROSYN) tablet 500 mg (not administered)  acetaminophen (TYLENOL) tablet 1,000 mg (not administered)      Final Clinical Impressions(s) / ED Diagnoses  Ankle pain:   Strict return precautions given for weakness, difficulty ambulating, inability to tolerate oral medication,  Worsening of rash, swelling of the lips or face, shortness of breath,  syncope or any concerns. No signs of systemic illness or infection. The patient is nontoxic-appearing on exam and vital signs are within normal limits.   I have reviewed the triage vital signs and the nursing notes. Pertinent labs &imaging results that were available during my care of the patient were reviewed by me  and considered in my medical decision making (see chart for details).  After history, exam, and medical workup I feel the patient has been appropriately medically screened and is safe for discharge home. Pertinent diagnoses were discussed with the patient. Patient was given return precautions.  I personally performed the services described in this documentation, which was scribed in my presence. The recorded information has been reviewed and is accurate.   Marland Kitchen  Jheri Mitter, MD 11/03/16 [phone]

## 2016-11-03 ENCOUNTER — Emergency Department (HOSPITAL_COMMUNITY): Payer: BLUE CROSS/BLUE SHIELD

## 2016-11-03 MED ORDER — NAPROXEN 500 MG PO TABS
500.0000 mg | ORAL_TABLET | Freq: Once | ORAL | Status: AC
  Administered 2016-11-03: 500 mg via ORAL
  Filled 2016-11-03: qty 1

## 2016-11-03 MED ORDER — DICLOFENAC SODIUM ER 100 MG PO TB24: 100.0000 mg | ORAL_TABLET | Freq: Every day | ORAL | 0 refills | Status: DC

## 2016-11-03 MED ORDER — ACETAMINOPHEN 500 MG PO TABS
1000.0000 mg | ORAL_TABLET | Freq: Once | ORAL | Status: AC
  Administered 2016-11-03: 1000 mg via ORAL
  Filled 2016-11-03: qty 2

## 2016-11-04 ENCOUNTER — Ambulatory Visit: Payer: BLUE CROSS/BLUE SHIELD | Admitting: Internal Medicine

## 2016-11-05 ENCOUNTER — Ambulatory Visit: Payer: BLUE CROSS/BLUE SHIELD | Admitting: Podiatry

## 2016-11-06 ENCOUNTER — Other Ambulatory Visit: Payer: Self-pay | Admitting: Internal Medicine

## 2016-11-06 NOTE — Telephone Encounter (Signed)
°  Relation to YK:DXIP Call back number:[phone] Pharmacy: [facility], [address] [phone] (Phone) [phone] (Fax)     Reason for call:  Patient completely out of her blood pressure medication triamterene-hydrochlorothiazide (MAXZIDE) 75-50 MG tablet,pharmacy advised Rx is needed. Informed patient in the future please call 48 hours in advance when she has at least 4 pills left, please advise

## 2016-11-18 ENCOUNTER — Encounter: Payer: BLUE CROSS/BLUE SHIELD | Admitting: Podiatry

## 2016-11-18 NOTE — Progress Notes (Deleted)
Subjective:    Patient ID: Sylvia Conner, female    DOB: [birth year], 61 y.o.   MRN: [id number]  HPI She is here for an acute visit.   Right ankle swollen:   Medications and allergies reviewed with patient and updated if appropriate.  Patient Active Problem List   Diagnosis Date Noted  . Urine discoloration 06/08/2016  . Gross hematuria 05/04/2016  . Left lower quadrant pain 05/04/2016  . Pain in joint, shoulder region 10/31/2015  . Adnexal mass 10/31/2015  . HTN (hypertension) 05/24/2011  . DM (diabetes mellitus) ([place]) 05/24/2011  . Hypothyroidism 05/24/2011  . History of hysterectomy 05/24/2011    Current Outpatient Prescriptions on File Prior to Visit  Medication Sig Dispense Refill  . aspirin EC 81 MG tablet Take 81 mg by mouth daily.    . Diclofenac Sodium CR (VOLTAREN-XR) 100 MG 24 hr tablet Take 1 tablet (100 mg total) by mouth daily. 10 tablet 0  . HYDROcodone-acetaminophen (NORCO/VICODIN) 5-325 MG tablet Take 1-2 tablets by mouth every 4 (four) hours as needed for moderate pain or severe pain. (Patient not taking: Reported on 11/03/2016) 16 tablet 0  . Insulin Glargine (TOUJEO SOLOSTAR) 300 UNIT/ML SOPN Inject 12 Units into the skin at bedtime. 3 pen 5  . levothyroxine (SYNTHROID, LEVOTHROID) 112 MCG tablet TAKE ONE TABLET BY MOUTH ONCE DAILY BEFORE BREAKFAST 90 tablet 1  . metFORMIN (GLUCOPHAGE) 1000 MG tablet Take 1 tablet (1,000 mg total) by mouth 2 (two) times daily with a meal. 60 tablet 3  . Multiple Vitamins-Minerals (HAIR SKIN AND NAILS FORMULA PO) Take 1 tablet by mouth daily.    . potassium chloride SA (K-DUR,KLOR-CON) 20 MEQ tablet Take 1 tablet (20 mEq total) by mouth daily. 30 tablet 3  . triamterene-hydrochlorothiazide (MAXZIDE) 75-50 MG tablet TAKE ONE TABLET BY MOUTH IN THE MORNING 30 tablet 5   No current facility-administered medications on file prior to visit.     Past Medical History:  Diagnosis Date  . Anemia   . Arthritis   . Diabetes  mellitus   . Hypertension   . Thyroid disease   . UTI (lower urinary tract infection)     Past Surgical History:  Procedure Laterality Date  . CESAREAN SECTION    . COLONOSCOPY    . PARTIAL HYSTERECTOMY     1995    Social History   Social History  . Marital status: Married    Spouse name: N/A  . Number of children: N/A  . Years of education: N/A   Social History Main Topics  . Smoking status: Never Smoker  . Smokeless tobacco: Never Used  . Alcohol use No  . Drug use: No  . Sexual activity: Not on file   Other Topics Concern  . Not on file   Social History Narrative  . No narrative on file    Family History  Problem Relation Age of Onset  . Hypertension Mother   . Diabetes Mother   . Diabetes Unknown   . Diabetes Sister   . Diabetes Paternal Grandmother     Review of Systems     Objective:  There were no vitals filed for this visit. There were no vitals filed for this visit. There is no height or weight on file to calculate BMI.  Wt Readings from Last 3 Encounters:  08/31/16 153 lb (69.4 kg)  06/08/16 155 lb (70.3 kg)  05/04/16 153 lb (69.4 kg)     Physical Exam  Assessment & Plan:   See Problem List for Assessment and Plan of chronic medical problems.

## 2016-11-19 ENCOUNTER — Ambulatory Visit: Payer: BLUE CROSS/BLUE SHIELD | Admitting: Internal Medicine

## 2016-11-23 NOTE — Progress Notes (Signed)
No show

## 2016-12-08 NOTE — Progress Notes (Signed)
Subjective:    Patient ID: Sylvia Conner, female    DOB: May 12, 1955, 61 y.o.   MRN: [id number]  HPI     Medications and allergies reviewed with patient and updated if appropriate.  Patient Active Problem List   Diagnosis Date Noted  . Urine discoloration 06/08/2016  . Gross hematuria 05/04/2016  . Left lower quadrant pain 05/04/2016  . Pain in joint, shoulder region 10/31/2015  . Adnexal mass 10/31/2015  . HTN (hypertension) 05/24/2011  . DM (diabetes mellitus) ([place]) 05/24/2011  . Hypothyroidism 05/24/2011  . History of hysterectomy 05/24/2011    Current Outpatient Prescriptions on File Prior to Visit  Medication Sig Dispense Refill  . aspirin EC 81 MG tablet Take 81 mg by mouth daily.    . Diclofenac Sodium CR (VOLTAREN-XR) 100 MG 24 hr tablet Take 1 tablet (100 mg total) by mouth daily. 10 tablet 0  . HYDROcodone-acetaminophen (NORCO/VICODIN) 5-325 MG tablet Take 1-2 tablets by mouth every 4 (four) hours as needed for moderate pain or severe pain. (Patient not taking: Reported on 11/03/2016) 16 tablet 0  . Insulin Glargine (TOUJEO SOLOSTAR) 300 UNIT/ML SOPN Inject 12 Units into the skin at bedtime. 3 pen 5  . levothyroxine (SYNTHROID, LEVOTHROID) 112 MCG tablet TAKE ONE TABLET BY MOUTH ONCE DAILY BEFORE BREAKFAST 90 tablet 1  . metFORMIN (GLUCOPHAGE) 1000 MG tablet Take 1 tablet (1,000 mg total) by mouth 2 (two) times daily with a meal. 60 tablet 3  . Multiple Vitamins-Minerals (HAIR SKIN AND NAILS FORMULA PO) Take 1 tablet by mouth daily.    . potassium chloride SA (K-DUR,KLOR-CON) 20 MEQ tablet Take 1 tablet (20 mEq total) by mouth daily. 30 tablet 3  . triamterene-hydrochlorothiazide (MAXZIDE) 75-50 MG tablet TAKE ONE TABLET BY MOUTH IN THE MORNING 30 tablet 5   No current facility-administered medications on file prior to visit.     Past Medical History:  Diagnosis Date  . Anemia   . Arthritis   . Diabetes mellitus   . Hypertension   . Thyroid disease   . UTI  (lower urinary tract infection)     Past Surgical History:  Procedure Laterality Date  . CESAREAN SECTION    . COLONOSCOPY    . PARTIAL HYSTERECTOMY     1995    Social History   Social History  . Marital status: Married    Spouse name: N/A  . Number of children: N/A  . Years of education: N/A   Social History Main Topics  . Smoking status: Never Smoker  . Smokeless tobacco: Never Used  . Alcohol use No  . Drug use: No  . Sexual activity: Not on file   Other Topics Concern  . Not on file   Social History Narrative  . No narrative on file    Family History  Problem Relation Age of Onset  . Hypertension Mother   . Diabetes Mother   . Diabetes Unknown   . Diabetes Sister   . Diabetes Paternal Grandmother     Review of Systems     Objective:  There were no vitals filed for this visit. Wt Readings from Last 3 Encounters:  08/31/16 153 lb (69.4 kg)  06/08/16 155 lb (70.3 kg)  05/04/16 153 lb (69.4 kg)   There is no height or weight on file to calculate BMI.   Physical Exam          Assessment & Plan:    See Problem List for Assessment and Plan of  chronic medical problems.    This encounter was created in error - please disregard.

## 2016-12-09 ENCOUNTER — Encounter: Payer: BLUE CROSS/BLUE SHIELD | Admitting: Internal Medicine

## 2016-12-23 NOTE — Patient Instructions (Addendum)
Test(s) ordered today. Your results will be released to [facility] (or called to you) after review, usually within 72hours after test completion. If any changes need to be made, you will be notified at that same time.  All other Health Maintenance issues reviewed.   All recommended immunizations and age-appropriate screenings are up-to-date or discussed.  Flu immunization administered today.   Medications reviewed and updated.  No changes recommended at this time.  Your prescription(s) have been submitted to your pharmacy. Please take as directed and contact our office if you believe you are having problem(s) with the medication(s).   Please followup in 6 months   Eczema Eczema, also called atopic dermatitis, is a skin disorder that causes inflammation of the skin. It causes a red rash and dry, scaly skin. The skin becomes very itchy. Eczema is generally worse during the cooler winter months and often improves with the warmth of summer. Eczema usually starts showing signs in infancy. Some children outgrow eczema, but it may last through adulthood. What are the causes? The exact cause of eczema is not known, but it appears to run in families. People with eczema often have a family history of eczema, allergies, asthma, or hay fever. Eczema is not contagious. Flare-ups of the condition may be caused by:  Contact with something you are sensitive or allergic to.  Stress.  What are the signs or symptoms?  Dry, scaly skin.  Red, itchy rash.  Itchiness. This may occur before the skin rash and may be very intense. How is this diagnosed? The diagnosis of eczema is usually made based on symptoms and medical history. How is this treated? Eczema cannot be cured, but symptoms usually can be controlled with treatment and other strategies. A treatment plan might include:  Controlling the itching and scratching. ? Use over-the-counter antihistamines as directed for itching. This is especially  useful at night when the itching tends to be worse. ? Use over-the-counter steroid creams as directed for itching. ? Avoid scratching. Scratching makes the rash and itching worse. It may also result in a skin infection (impetigo) due to a break in the skin caused by scratching.  Keeping the skin well moisturized with creams every day. This will seal in moisture and help prevent dryness. Lotions that contain alcohol and water should be avoided because they can dry the skin.  Limiting exposure to things that you are sensitive or allergic to (allergens).  Recognizing situations that cause stress.  Developing a plan to manage stress.  Follow these instructions at home:  Only take over-the-counter or prescription medicines as directed by your health care provider.  Do not use anything on the skin without checking with your health care provider.  Keep baths or showers short (5 minutes) in warm (not hot) water. Use mild cleansers for bathing. These should be unscented. You may add nonperfumed bath oil to the bath water. It is best to avoid soap and bubble bath.  Immediately after a bath or shower, when the skin is still damp, apply a moisturizing ointment to the entire body. This ointment should be a petroleum ointment. This will seal in moisture and help prevent dryness. The thicker the ointment, the better. These should be unscented.  Keep fingernails cut short. Children with eczema may need to wear soft gloves or mittens at night after applying an ointment.  Dress in clothes made of cotton or cotton blends. Dress lightly, because heat increases itching.  A child with eczema should stay away from anyone with  fever blisters or cold sores. The virus that causes fever blisters (herpes simplex) can cause a serious skin infection in children with eczema. Contact a health care provider if:  Your itching interferes with sleep.  Your rash gets worse or is not better within 1 week after starting  treatment.  You see pus or soft yellow scabs in the rash area.  You have a fever.  You have a rash flare-up after contact with someone who has fever blisters. This information is not intended to replace advice given to you by your health care provider. Make sure you discuss any questions you have with your health care provider. Document Released: 03/19/2000 Document Revised: 08/28/2015 Document Reviewed: 10/23/2012 Elsevier Interactive Patient Education  2017 [employer].

## 2016-12-23 NOTE — Progress Notes (Signed)
Subjective:    Patient ID: Sylvia Conner, female    DOB: [birth year], 61 y.o.   MRN: [id number]  HPI The patient is here for follow up.  Hypertension: She is taking her medication daily. She is compliant with a low sodium diet.  She denies chest pain, palpitations, shortness of breath and regular headaches. She is very active at work, but not exercising regularly.  She does not monitor her blood pressure at home.    Diabetes: She is taking her medication daily as prescribed. She is compliant with a diabetic diet. She is very active at work, but not exercising regularly.  She checks her feet daily and denies foot lesions, except the dry patch of skin.    Hypothyroidism:  She is taking her medication daily.  She denies any recent changes in energy or weight that are unexplained.   Right foot:  She has a patch of dryness on her right foot.  She did see podiatry and was diagnosed with eczema.  She is using cream regularly and it helps, but is still there.  The podiatrist gave her something - a clear ointment and she thinks it helped a [place].    Medications and allergies reviewed with patient and updated if appropriate.  Patient Active Problem List   Diagnosis Date Noted  . Urine discoloration 06/08/2016  . Gross hematuria 05/04/2016  . Left lower quadrant pain 05/04/2016  . Pain in joint, shoulder region 10/31/2015  . Adnexal mass 10/31/2015  . HTN (hypertension) 05/24/2011  . DM (diabetes mellitus) ([place]) 05/24/2011  . Hypothyroidism 05/24/2011  . History of hysterectomy 05/24/2011    Current Outpatient Prescriptions on File Prior to Visit  Medication Sig Dispense Refill  . aspirin EC 81 MG tablet Take 81 mg by mouth daily.    . Diclofenac Sodium CR (VOLTAREN-XR) 100 MG 24 hr tablet Take 1 tablet (100 mg total) by mouth daily. 10 tablet 0  . Insulin Glargine (TOUJEO SOLOSTAR) 300 UNIT/ML SOPN Inject 12 Units into the skin at bedtime. 3 pen 5  . levothyroxine (SYNTHROID,  LEVOTHROID) 112 MCG tablet TAKE ONE TABLET BY MOUTH ONCE DAILY BEFORE BREAKFAST 90 tablet 1  . metFORMIN (GLUCOPHAGE) 1000 MG tablet Take 1 tablet (1,000 mg total) by mouth 2 (two) times daily with a meal. 60 tablet 3  . Multiple Vitamins-Minerals (HAIR SKIN AND NAILS FORMULA PO) Take 1 tablet by mouth daily.    . potassium chloride SA (K-DUR,KLOR-CON) 20 MEQ tablet Take 1 tablet (20 mEq total) by mouth daily. 30 tablet 3  . triamterene-hydrochlorothiazide (MAXZIDE) 75-50 MG tablet TAKE ONE TABLET BY MOUTH IN THE MORNING 30 tablet 5   No current facility-administered medications on file prior to visit.     Past Medical History:  Diagnosis Date  . Anemia   . Arthritis   . Diabetes mellitus   . Hypertension   . Thyroid disease   . UTI (lower urinary tract infection)     Past Surgical History:  Procedure Laterality Date  . CESAREAN SECTION    . COLONOSCOPY    . PARTIAL HYSTERECTOMY     1995    Social History   Social History  . Marital status: Married    Spouse name: N/A  . Number of children: N/A  . Years of education: N/A   Social History Main Topics  . Smoking status: Never Smoker  . Smokeless tobacco: Never Used  . Alcohol use No  . Drug use: No  . Sexual activity:  Not Asked   Other Topics Concern  . None   Social History Narrative  . None    Family History  Problem Relation Age of Onset  . Hypertension Mother   . Diabetes Mother   . Diabetes Unknown   . Diabetes Sister   . Diabetes Paternal Grandmother     Review of Systems  Constitutional: Negative for appetite change, chills and fever.  Respiratory: Negative for cough, shortness of breath and wheezing.   Cardiovascular: Positive for leg swelling (rare). Negative for chest pain and palpitations.  Neurological: Negative for light-headedness and headaches.       Objective:   Vitals:   12/24/16 1107  BP: 130/80  Pulse: 67  Temp: 98.4 F (36.9 C)  SpO2: 99%   Wt Readings from Last 3  Encounters:  12/24/16 146 lb (66.2 kg)  08/31/16 153 lb (69.4 kg)  06/08/16 155 lb (70.3 kg)   Body mass index is 26.7 kg/m.   Physical Exam    Constitutional: Appears well-developed and well-nourished. No distress.  HENT:  Head: Normocephalic and atraumatic.  Neck: Neck supple. No tracheal deviation present. No thyromegaly present.  No cervical lymphadenopathy Cardiovascular: Normal rate, regular rhythm and normal heart sounds.   No murmur heard. No carotid bruit .  No edema Pulmonary/Chest: Effort normal and breath sounds normal. No respiratory distress. No has no wheezes. No rales.  Skin: Skin is warm and dry. Not diaphoretic.  Psychiatric: Normal mood and affect. Behavior is normal.      Assessment & Plan:    See Problem List for Assessment and Plan of chronic medical problems.

## 2016-12-24 ENCOUNTER — Ambulatory Visit (INDEPENDENT_AMBULATORY_CARE_PROVIDER_SITE_OTHER): Payer: BLUE CROSS/BLUE SHIELD | Admitting: Internal Medicine

## 2016-12-24 ENCOUNTER — Other Ambulatory Visit (INDEPENDENT_AMBULATORY_CARE_PROVIDER_SITE_OTHER): Payer: BLUE CROSS/BLUE SHIELD

## 2016-12-24 ENCOUNTER — Encounter: Payer: Self-pay | Admitting: Internal Medicine

## 2016-12-24 VITALS — BP 130/80 | HR 67 | Temp 98.4°F | Ht 62.0 in | Wt 146.0 lb

## 2016-12-24 DIAGNOSIS — E038 Other specified hypothyroidism: Secondary | ICD-10-CM | POA: Diagnosis not present

## 2016-12-24 DIAGNOSIS — L309 Dermatitis, unspecified: Secondary | ICD-10-CM | POA: Diagnosis not present

## 2016-12-24 DIAGNOSIS — Z23 Encounter for immunization: Secondary | ICD-10-CM | POA: Diagnosis not present

## 2016-12-24 DIAGNOSIS — E119 Type 2 diabetes mellitus without complications: Secondary | ICD-10-CM

## 2016-12-24 DIAGNOSIS — I10 Essential (primary) hypertension: Secondary | ICD-10-CM | POA: Diagnosis not present

## 2016-12-24 DIAGNOSIS — Z794 Long term (current) use of insulin: Secondary | ICD-10-CM

## 2016-12-24 LAB — COMPREHENSIVE METABOLIC PANEL
ALK PHOS: 71 U/L (ref 39–117)
ALT: 12 U/L (ref 0–35)
AST: 21 U/L (ref 0–37)
Albumin: 4.1 g/dL (ref 3.5–5.2)
BILIRUBIN TOTAL: 0.6 mg/dL (ref 0.2–1.2)
BUN: 12 mg/dL (ref 6–23)
CO2: 30 meq/L (ref 19–32)
CREATININE: 0.78 mg/dL (ref 0.40–1.20)
Calcium: 9.5 mg/dL (ref 8.4–10.5)
Chloride: 101 mEq/L (ref 96–112)
GFR: 96.95 mL/min (ref >60.00)
GLUCOSE: 115 mg/dL — AB (ref 70–99)
Potassium: 3.6 mEq/L (ref 3.5–5.1)
Sodium: 136 mEq/L (ref 135–145)
TOTAL PROTEIN: 7.8 g/dL (ref 6.0–8.3)

## 2016-12-24 LAB — LIPID PANEL
CHOL/HDL RATIO: 3
Cholesterol: 217 mg/dL — ABNORMAL HIGH (ref 0–200)
HDL: 86.3 mg/dL (ref >39.00)
LDL Cholesterol: 114 mg/dL — ABNORMAL HIGH (ref 0–99)
NonHDL: 130.4
Triglycerides: 84 mg/dL (ref 0.0–149.0)
VLDL: 16.8 mg/dL (ref 0.0–40.0)

## 2016-12-24 LAB — HEMOGLOBIN A1C: HEMOGLOBIN A1C: 7.4 % — AB (ref 4.6–6.5)

## 2016-12-24 LAB — TSH: TSH: 2.37 u[IU]/mL (ref 0.35–4.50)

## 2016-12-24 MED ORDER — TRIAMTERENE-HCTZ 75-50 MG PO TABS: 1.0000 | ORAL_TABLET | Freq: Every morning | ORAL | 1 refills | Status: DC

## 2016-12-24 MED ORDER — INSULIN GLARGINE 300 UNIT/ML ~~LOC~~ SOPN: 12.0000 [IU] | PEN_INJECTOR | Freq: Every day | SUBCUTANEOUS | 1 refills | Status: DC

## 2016-12-24 MED ORDER — LEVOTHYROXINE SODIUM 112 MCG PO TABS: 112.0000 ug | ORAL_TABLET | Freq: Every day | ORAL | 1 refills | Status: DC

## 2016-12-24 MED ORDER — POTASSIUM CHLORIDE CRYS ER 20 MEQ PO TBCR: 20.0000 meq | EXTENDED_RELEASE_TABLET | Freq: Every day | ORAL | 1 refills | Status: DC

## 2016-12-24 MED ORDER — METFORMIN HCL 1000 MG PO TABS: 1000.0000 mg | ORAL_TABLET | Freq: Two times a day (BID) | ORAL | 1 refills | Status: DC

## 2016-12-24 NOTE — Assessment & Plan Note (Signed)
Check a1c Low sugar / carb diet Stressed regular exercise   

## 2016-12-24 NOTE — Assessment & Plan Note (Signed)
Check tsh  Titrate med dose if needed  

## 2016-12-24 NOTE — Assessment & Plan Note (Addendum)
Present for 2 years  - saw podiatry Controlled with using lotion twice daily and prescription ointment prescribed by podiatry Can refer to derm if she wants

## 2016-12-24 NOTE — Assessment & Plan Note (Signed)
BP well controlled Current regimen effective and well tolerated Continue current medications at current doses cmp  

## 2016-12-25 ENCOUNTER — Other Ambulatory Visit: Payer: Self-pay | Admitting: Internal Medicine

## 2016-12-25 MED ORDER — INSULIN GLARGINE 300 UNIT/ML ~~LOC~~ SOPN: 14.0000 [IU] | PEN_INJECTOR | Freq: Every day | SUBCUTANEOUS | 1 refills | Status: DC

## 2017-01-20 ENCOUNTER — Encounter: Payer: Self-pay | Admitting: Emergency Medicine

## 2017-01-31 ENCOUNTER — Other Ambulatory Visit: Payer: Self-pay | Admitting: Internal Medicine

## 2017-03-08 ENCOUNTER — Telehealth: Payer: Self-pay | Admitting: Internal Medicine

## 2017-03-08 NOTE — Telephone Encounter (Signed)
Copied from [place]. Topic: General - Other >> Mar 08, 2017  9:06 AM Clack, Laban Emperor wrote: Reason for CRM: Melanie from [facility] was calling to confirm a fax was recv'd and faxed back. Fax was sent over for Billey Gosling on 03/02/17 for diabetes supplies refills. Please f/u with Melanie at [phone].

## 2017-03-08 NOTE — Telephone Encounter (Signed)
LVM for pt to call back and clarify that she uses [facility]

## 2017-03-10 ENCOUNTER — Other Ambulatory Visit: Payer: Self-pay | Admitting: Internal Medicine

## 2017-03-11 ENCOUNTER — Other Ambulatory Visit: Payer: Self-pay | Admitting: Emergency Medicine

## 2017-03-11 MED ORDER — LEVOTHYROXINE SODIUM 112 MCG PO TABS: 112.0000 ug | ORAL_TABLET | Freq: Every day | ORAL | 1 refills | Status: DC

## 2017-03-17 NOTE — Telephone Encounter (Signed)
LVM with pt to call back and let me know if she uses [facility]

## 2017-03-22 ENCOUNTER — Encounter: Payer: Self-pay | Admitting: Emergency Medicine

## 2017-03-22 NOTE — Telephone Encounter (Signed)
Mailed pt letter stating we have been trying to contact her in order send in diabetic supply information. Can not send in orders without pts permission.

## 2017-04-27 ENCOUNTER — Telehealth: Payer: Self-pay | Admitting: Internal Medicine

## 2017-04-27 NOTE — Telephone Encounter (Signed)
I have left numerous messages for pt to call back and discuss this before sending faxes over.

## 2017-04-27 NOTE — Telephone Encounter (Signed)
Will forward to MD assistant since faxes go to her. Pls advise on msg below.Marland KitchenJohny Conner

## 2017-04-27 NOTE — Telephone Encounter (Signed)
Sylvia Conner, with [facility] called in to follow up on form faxed on (04/19/17) for diabetic testing supply?   CB: [phone] - she will refax today. Please confirm receipt. Ask to speak with her.

## 2017-04-28 ENCOUNTER — Other Ambulatory Visit: Payer: Self-pay | Admitting: Internal Medicine

## 2017-05-02 NOTE — Progress Notes (Signed)
Subjective:    Patient ID: Sylvia Conner, female    DOB: 1955/07/26, 62 y.o.   MRN: [id number]  HPI The patient is here for follow up.  Diabetes: She is taking her medication daily as prescribed. She is compliant with a diabetic diet. She is exercising some. She monitors her sugars and they have been running 134-, 121, 94. She checks her feet daily and denies foot lesions other than the eczema. She is up-to-date with an ophthalmology examination and has an upcoming appointment as well..   Hypertension: She is taking her medication daily. She is compliant with a low sodium diet.  She denies chest pain, palpitations, edema, shortness of breath and regular headaches. She is exercising some.      Hypothyroidism:  She is taking her medication daily.  She denies any recent changes in energy or weight that are unexplained.   Eczema on right foot:  It is very itchy and she she keeps getting another layer of skin on it.  She has seen derm in the past and was prescribed an ointment that started with T.  It did help, but she had to continuously use it.  She has been applying over-the-counter Vaseline and lotions and it helps minimally.  Right arm lipoma:  She has had a lipoma on her right arm for a while. She does not think it has gotten larger.  It does not hurt.  She thinks she would like to have it removed.    Medications and allergies reviewed with patient and updated if appropriate.  Patient Active Problem List   Diagnosis Date Noted  . Eczema 12/24/2016  . Left lower quadrant pain 05/04/2016  . Pain in joint, shoulder region 10/31/2015  . Adnexal mass 10/31/2015  . HTN (hypertension) 05/24/2011  . DM (diabetes mellitus) ([place]) 05/24/2011  . Hypothyroidism 05/24/2011  . History of hysterectomy 05/24/2011    Current Outpatient Medications on File Prior to Visit  Medication Sig Dispense Refill  . aspirin EC 81 MG tablet Take 81 mg by mouth daily.    . Diclofenac Sodium CR (VOLTAREN-XR)  100 MG 24 hr tablet Take 1 tablet (100 mg total) by mouth daily. 10 tablet 0  . Insulin Glargine (TOUJEO SOLOSTAR) 300 UNIT/ML SOPN Inject 14 Units into the skin at bedtime. 15 pen 1  . KLOR-CON M20 20 MEQ tablet TAKE 1 TABLET BY MOUTH ONCE DAILY 90 tablet 1  . levothyroxine (SYNTHROID, LEVOTHROID) 112 MCG tablet Take 1 tablet (112 mcg total) by mouth daily before breakfast. 90 tablet 1  . metFORMIN (GLUCOPHAGE) 1000 MG tablet Take 1 tablet (1,000 mg total) by mouth 2 (two) times daily with a meal. 180 tablet 1  . metFORMIN (GLUCOPHAGE) 1000 MG tablet TAKE ONE TABLET BY MOUTH TWICE DAILY WITH A MEAL 180 tablet 0  . Multiple Vitamins-Minerals (HAIR SKIN AND NAILS FORMULA PO) Take 1 tablet by mouth daily.    . potassium chloride SA (K-DUR,KLOR-CON) 20 MEQ tablet Take 1 tablet (20 mEq total) by mouth daily. 90 tablet 1  . triamterene-hydrochlorothiazide (MAXZIDE) 75-50 MG tablet Take 1 tablet by mouth every morning. 90 tablet 1   No current facility-administered medications on file prior to visit.     Past Medical History:  Diagnosis Date  . Anemia   . Arthritis   . Diabetes mellitus   . Hypertension   . Thyroid disease   . UTI (lower urinary tract infection)     Past Surgical History:  Procedure Laterality Date  .  CESAREAN SECTION    . COLONOSCOPY    . PARTIAL HYSTERECTOMY     1995    Social History   Socioeconomic History  . Marital status: Married    Spouse name: None  . Number of children: None  . Years of education: None  . Highest education level: None  Social Needs  . Financial resource strain: None  . Food insecurity - worry: None  . Food insecurity - inability: None  . Transportation needs - medical: None  . Transportation needs - non-medical: None  Occupational History  . None  Tobacco Use  . Smoking status: Never Smoker  . Smokeless tobacco: Never Used  Substance and Sexual Activity  . Alcohol use: No  . Drug use: No  . Sexual activity: None  Other Topics  Concern  . None  Social History Narrative  . None    Family History  Problem Relation Age of Onset  . Hypertension Mother   . Diabetes Mother   . Diabetes Unknown   . Diabetes Sister   . Diabetes Paternal Grandmother     Review of Systems  Constitutional: Negative for chills and fever.  Respiratory: Negative for cough, shortness of breath and wheezing.   Cardiovascular: Negative for chest pain, palpitations and leg swelling.  Neurological: Negative for light-headedness and headaches.       Objective:   Vitals:   05/03/17 1549  BP: 114/80  Pulse: 88  Resp: 16  Temp: 98.3 F (36.8 C)  SpO2: 97%   Wt Readings from Last 3 Encounters:  05/03/17 155 lb (70.3 kg)  12/24/16 146 lb (66.2 kg)  08/31/16 153 lb (69.4 kg)   Body mass index is 28.35 kg/m.   Physical Exam    Constitutional: Appears well-developed and well-nourished. No distress.  HENT:  Head: Normocephalic and atraumatic.  Neck: Neck supple. No tracheal deviation present. No thyromegaly present.  No cervical lymphadenopathy Cardiovascular: Normal rate, regular rhythm and normal heart sounds.   No murmur heard. No carotid bruit .  No edema Pulmonary/Chest: Effort normal and breath sounds normal. No respiratory distress. No has no wheezes. No rales.  Skin: Skin is warm and dry. Not diaphoretic. Significant dryness with right medial aspect of foot layers of skin missing from areas; no open wound, discharge; tangerine sized lipoma right lateral upper arm Psychiatric: Normal mood and affect. Behavior is normal.   Diabetic Foot Exam - Simple   Simple Foot Form Diabetic Foot exam was performed with the following findings:  Yes 05/03/2017  4:14 PM  Visual Inspection Sensation Testing Intact to touch and monofilament testing bilaterally:  Yes Pulse Check Posterior Tibialis and Dorsalis pulse intact bilaterally:  Yes Comments No deformities bilaterally.  No ulcerations bilaterally.  Significant dryness and  sloughing of the skin right medial aspect the foot from eczema       Assessment & Plan:    See Problem List for Assessment and Plan of chronic medical problems.

## 2017-05-03 ENCOUNTER — Telehealth: Payer: Self-pay | Admitting: Emergency Medicine

## 2017-05-03 ENCOUNTER — Encounter: Payer: Self-pay | Admitting: Internal Medicine

## 2017-05-03 ENCOUNTER — Other Ambulatory Visit (INDEPENDENT_AMBULATORY_CARE_PROVIDER_SITE_OTHER): Payer: BLUE CROSS/BLUE SHIELD

## 2017-05-03 ENCOUNTER — Ambulatory Visit: Payer: BLUE CROSS/BLUE SHIELD | Admitting: Internal Medicine

## 2017-05-03 VITALS — BP 114/80 | HR 88 | Temp 98.3°F | Resp 16 | Wt 155.0 lb

## 2017-05-03 DIAGNOSIS — E038 Other specified hypothyroidism: Secondary | ICD-10-CM

## 2017-05-03 DIAGNOSIS — E119 Type 2 diabetes mellitus without complications: Secondary | ICD-10-CM | POA: Diagnosis not present

## 2017-05-03 DIAGNOSIS — D1720 Benign lipomatous neoplasm of skin and subcutaneous tissue of unspecified limb: Secondary | ICD-10-CM | POA: Insufficient documentation

## 2017-05-03 DIAGNOSIS — I10 Essential (primary) hypertension: Secondary | ICD-10-CM

## 2017-05-03 DIAGNOSIS — Z794 Long term (current) use of insulin: Secondary | ICD-10-CM

## 2017-05-03 DIAGNOSIS — D1721 Benign lipomatous neoplasm of skin and subcutaneous tissue of right arm: Secondary | ICD-10-CM

## 2017-05-03 LAB — COMPREHENSIVE METABOLIC PANEL
ALK PHOS: 89 U/L (ref 39–117)
ALT: 25 U/L (ref 0–35)
AST: 29 U/L (ref 0–37)
Albumin: 4.2 g/dL (ref 3.5–5.2)
BUN: 15 mg/dL (ref 6–23)
CHLORIDE: 100 meq/L (ref 96–112)
CO2: 31 mEq/L (ref 19–32)
Calcium: 9.2 mg/dL (ref 8.4–10.5)
Creatinine, Ser: 0.78 mg/dL (ref 0.40–1.20)
GFR: 96.83 mL/min (ref >60.00)
GLUCOSE: 96 mg/dL (ref 70–99)
Potassium: 3.5 mEq/L (ref 3.5–5.1)
Sodium: 137 mEq/L (ref 135–145)
TOTAL PROTEIN: 7.7 g/dL (ref 6.0–8.3)
Total Bilirubin: 0.6 mg/dL (ref 0.2–1.2)

## 2017-05-03 LAB — LIPID PANEL
Cholesterol: 202 mg/dL — ABNORMAL HIGH (ref 0–200)
HDL: 89.8 mg/dL (ref >39.00)
LDL CALC: 95 mg/dL (ref 0–99)
NONHDL: 111.79
Total CHOL/HDL Ratio: 2
Triglycerides: 82 mg/dL (ref 0.0–149.0)
VLDL: 16.4 mg/dL (ref 0.0–40.0)

## 2017-05-03 LAB — TSH: TSH: 1 u[IU]/mL (ref 0.35–4.50)

## 2017-05-03 LAB — HEMOGLOBIN A1C: HEMOGLOBIN A1C: 7.2 % — AB (ref 4.6–6.5)

## 2017-05-03 MED ORDER — CLOBETASOL PROPIONATE 0.05 % EX OINT: 1.0000 "application " | TOPICAL_OINTMENT | Freq: Two times a day (BID) | CUTANEOUS | 2 refills | Status: DC

## 2017-05-03 MED ORDER — TRIAMTERENE-HCTZ 75-50 MG PO TABS: 1.0000 | ORAL_TABLET | Freq: Every morning | ORAL | 1 refills | Status: DC

## 2017-05-03 NOTE — Telephone Encounter (Signed)
Contacted [facility] to advise that we have received forms but will not be faxed back without pts approval.

## 2017-05-03 NOTE — Assessment & Plan Note (Signed)
Check tsh  Titrate med dose if needed  

## 2017-05-03 NOTE — Patient Instructions (Signed)
  Test(s) ordered today. Your results will be released to [place] (or called to you) after review, usually within 72hours after test completion. If any changes need to be made, you will be notified at that same time.  Medications reviewed and updated.  Changes include at steroid cream for the foot.   Your prescription(s) have been submitted to your pharmacy. Please take as directed and contact our office if you believe you are having problem(s) with the medication(s).  A referral was ordered for surgery for the lipoma  Please followup in 6 months

## 2017-05-03 NOTE — Telephone Encounter (Signed)
Per pt she DOES NOT use [facility], she gets her supplies from [facility]

## 2017-05-03 NOTE — Assessment & Plan Note (Signed)
Lipoma right upper lateral arm-she is interested in having it removed Will refer to surgery for further evaluation and possible removal

## 2017-05-03 NOTE — Assessment & Plan Note (Signed)
BP well controlled Current regimen effective and well tolerated Continue current medications at current doses cmp  

## 2017-05-03 NOTE — Assessment & Plan Note (Signed)
Sugars well controlled at home Check A1c Has eye exam scheduled-asked her to have the report sent to me Check A1c Continue current medications

## 2017-05-03 NOTE — Telephone Encounter (Signed)
Copied from [place]. Topic: General - Other >> Apr 13, 2017  3:39 PM Corie Chiquito, [place] wrote: Reason for CRM: Gemma called and would like for Dr.Burns to confirm that she has received the fax that was sent over from them about this patient. If she could give them a call back at [phone]  >> May 03, 2017 12:03 PM Burnis Medin, NT wrote: Paralee Cancel called back from [facility] about not receiving the fax she sent over for diabetic supplies for patient . Pls call her back [phone]

## 2017-05-25 ENCOUNTER — Ambulatory Visit: Payer: Self-pay | Admitting: Internal Medicine

## 2017-05-30 ENCOUNTER — Ambulatory Visit: Payer: Self-pay | Admitting: Surgery

## 2017-05-30 NOTE — H&P (Signed)
History of Present Illness Sylvia Conner. Asha Grumbine MD; 05/30/2017 9:55 AM) The patient is a 62 year old female who presents with a complaint of Mass. Referred by Dr. Celso Amy for subcutaneous mass right upper arm and right chest This is a 62 year old female who presents with more than 10 year history of enlarging masses on her right upper lateral arm as well as her right chest. These have caused some discomfort. They are noticeably enlarged. She would like to have these removed. There has not been any imaging of these areas.     Past Surgical History Levonne Spiller, CMA; 05/30/2017 9:35 AM) Cesarean Section - 1 Colon Polyp Removal - Colonoscopy Colon Polyp Removal - Open Hemorrhoidectomy Hysterectomy (not due to cancer) - Partial  Diagnostic Studies History Levonne Spiller, CMA; 05/30/2017 9:35 AM) Colonoscopy 1-5 years ago  Allergies Levonne Spiller, CMA; 05/30/2017 9:35 AM) Penicillins Allergies Reconciled  Medication History Levonne Spiller, CMA; 05/30/2017 9:36 AM) Levothyroxine Sodium (112MCG Tablet, Oral) Active. MetFORMIN HCl ER (Oral) Specific strength unknown - Active. Toujeo SoloStar (300UNIT/ML Soln Pen-inj, Subcutaneous) Active. Medications Reconciled  Social History Andee Poles Education officer, museum, CMA; 05/30/2017 9:35 AM) Caffeine use Carbonated beverages, Coffee, Tea. No alcohol use No drug use Tobacco use Never smoker.  Family History Levonne Spiller, [place]; 05/30/2017 9:35 AM) Arthritis Mother. Diabetes Mellitus Mother, Sister. Hypertension Mother, Sister.  Pregnancy / Birth History Levonne Spiller, CMA; 05/30/2017 9:35 AM) Age at menarche 37 years. Age of menopause 48-60 Gravida 4 Maternal age 31-20 Para 2  Other Problems Levonne Spiller, CMA; 05/30/2017 9:35 AM) Arthritis Diabetes Mellitus High blood pressure Thyroid Disease     Review of Systems Andee Poles Gerrigner CMA; 05/30/2017 9:35 AM) General Present-  Night Sweats. Not Present- Appetite Loss, Chills, Fatigue, Fever, Weight Gain and Weight Loss. Skin Not Present- Change in Wart/Mole, Dryness, Hives, Jaundice, New Lesions, Non-Healing Wounds, Rash and Ulcer. HEENT Present- Wears glasses/contact lenses. Not Present- Earache, Hearing Loss, Hoarseness, Nose Bleed, Oral Ulcers, Ringing in the Ears, Seasonal Allergies, Sinus Pain, Sore Throat, Visual Disturbances and Yellow Eyes. Respiratory Not Present- Bloody sputum, Chronic Cough, Difficulty Breathing, Snoring and Wheezing. Breast Not Present- Breast Mass, Breast Pain, Nipple Discharge and Skin Changes. Cardiovascular Not Present- Chest Pain, Difficulty Breathing Lying Down, Leg Cramps, Palpitations, Rapid Heart Rate, Shortness of Breath and Swelling of Extremities. Female Genitourinary Not Present- Frequency, Nocturia, Painful Urination, Pelvic Pain and Urgency. Musculoskeletal Not Present- Back Pain, Joint Pain, Joint Stiffness, Muscle Pain, Muscle Weakness and Swelling of Extremities. Neurological Not Present- Decreased Memory, Fainting, Headaches, Numbness, Seizures, Tingling, Tremor, Trouble walking and Weakness. Psychiatric Not Present- Anxiety, Bipolar, Change in Sleep Pattern, Depression, Fearful and Frequent crying. Endocrine Present- Hot flashes. Not Present- Cold Intolerance, Excessive Hunger, Hair Changes, Heat Intolerance and New Diabetes. Hematology Not Present- Blood Thinners, Easy Bruising, Excessive bleeding, Gland problems, HIV and Persistent Infections.  Vitals Andee Poles Gerrigner CMA; 05/30/2017 9:37 AM) 05/30/2017 9:36 AM Weight: 155 lb Height: 62in Body Surface Area: 1.72 m Body Mass Index: 28.35 kg/m  Temp.: 98.14F(Oral)  Pulse: 102 (Regular)  BP: 102/76 (Sitting, Left Arm, Standard)      Physical Exam Rodman Key K. Melroy Bougher MD; 05/30/2017 9:56 AM)  The physical exam findings are as follows: Note:WDWN in NAD Eyes: Pupils equal, round; sclera anicteric HENT:  Oral mucosa moist; good dentition Neck: No masses palpated, no thyromegaly Lungs: CTA bilaterally; normal respiratory effort Right lateral chest - oval-shaped 3 cm subcutaneous mass, smooth, well-demarcated; mildly tender Right lateral upper arm - protruding 4 cm subcutaneous mass, smooth, well-demarcated,  mildly tender CV: Regular rate and rhythm; no murmurs; extremities well-perfused with no edema Abd: +bowel sounds, soft, non-tender, no palpable organomegaly; no palpable hernias Skin: Warm, dry; no sign of jaundice Psychiatric - alert and oriented x 4; calm mood and affect    Assessment & Plan Rodman Key K. 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 The surgical procedure has been discussed with the patient. Potential risks, benefits, alternative treatments, and expected outcomes have been explained. All of the patient's questions at this time have been answered. The likelihood of reaching the patient's treatment goal is good. The patient understand the proposed surgical procedure and wishes to proceed.  Sylvia Conner. Georgette Dover, MD, [facility] Surgery  General/ Trauma Surgery  05/30/2017 9:58 AM

## 2017-06-03 ENCOUNTER — Ambulatory Visit: Payer: Self-pay | Admitting: Internal Medicine

## 2017-06-11 NOTE — Progress Notes (Deleted)
Subjective:    Patient ID: Sylvia Conner, female    DOB: 19-Aug-1955, 62 y.o.   MRN: [id number]  HPI The patient is here for an acute visit.  Diarrhea:    Medications and allergies reviewed with patient and updated if appropriate.  Patient Active Problem List   Diagnosis Date Noted  . Lipoma of arm 05/03/2017  . Eczema 12/24/2016  . Left lower quadrant pain 05/04/2016  . Pain in joint, shoulder region 10/31/2015  . Adnexal mass 10/31/2015  . HTN (hypertension) 05/24/2011  . DM (diabetes mellitus) ([place]) 05/24/2011  . Hypothyroidism 05/24/2011  . History of hysterectomy 05/24/2011    Current Outpatient Medications on File Prior to Visit  Medication Sig Dispense Refill  . aspirin EC 81 MG tablet Take 81 mg by mouth daily.    . clobetasol ointment (TEMOVATE) 6.38 % Apply 1 application topically 2 (two) times daily. Do not use for more than two weeks at a time 45 g 2  . Diclofenac Sodium CR (VOLTAREN-XR) 100 MG 24 hr tablet Take 1 tablet (100 mg total) by mouth daily. 10 tablet 0  . Insulin Glargine (TOUJEO SOLOSTAR) 300 UNIT/ML SOPN Inject 14 Units into the skin at bedtime. 15 pen 1  . KLOR-CON M20 20 MEQ tablet TAKE 1 TABLET BY MOUTH ONCE DAILY 90 tablet 1  . levothyroxine (SYNTHROID, LEVOTHROID) 112 MCG tablet Take 1 tablet (112 mcg total) by mouth daily before breakfast. 90 tablet 1  . metFORMIN (GLUCOPHAGE) 1000 MG tablet TAKE ONE TABLET BY MOUTH TWICE DAILY WITH A MEAL 180 tablet 0  . Multiple Vitamins-Minerals (HAIR SKIN AND NAILS FORMULA PO) Take 1 tablet by mouth daily.    . potassium chloride SA (K-DUR,KLOR-CON) 20 MEQ tablet Take 1 tablet (20 mEq total) by mouth daily. 90 tablet 1  . triamterene-hydrochlorothiazide (MAXZIDE) 75-50 MG tablet Take 1 tablet by mouth every morning. 90 tablet 1   No current facility-administered medications on file prior to visit.     Past Medical History:  Diagnosis Date  . Anemia   . Arthritis   . Diabetes mellitus   .  Hypertension   . Thyroid disease   . UTI (lower urinary tract infection)     Past Surgical History:  Procedure Laterality Date  . CESAREAN SECTION    . COLONOSCOPY    . PARTIAL HYSTERECTOMY     1995    Social History   Socioeconomic History  . Marital status: Married    Spouse name: Not on file  . Number of children: Not on file  . Years of education: Not on file  . Highest education level: Not on file  Social Needs  . Financial resource strain: Not on file  . Food insecurity - worry: Not on file  . Food insecurity - inability: Not on file  . Transportation needs - medical: Not on file  . Transportation needs - non-medical: Not on file  Occupational History  . Not on file  Tobacco Use  . Smoking status: Never Smoker  . Smokeless tobacco: Never Used  Substance and Sexual Activity  . Alcohol use: No  . Drug use: No  . Sexual activity: Not on file  Other Topics Concern  . Not on file  Social History Narrative  . Not on file    Family History  Problem Relation Age of Onset  . Hypertension Mother   . Diabetes Mother   . Diabetes Unknown   . Diabetes Sister   . Diabetes Paternal  Grandmother     Review of Systems     Objective:  There were no vitals filed for this visit. Wt Readings from Last 3 Encounters:  05/03/17 155 lb (70.3 kg)  12/24/16 146 lb (66.2 kg)  08/31/16 153 lb (69.4 kg)   There is no height or weight on file to calculate BMI.   Physical Exam         Assessment & Plan:    See Problem List for Assessment and Plan of chronic medical problems.

## 2017-06-13 ENCOUNTER — Ambulatory Visit: Payer: Self-pay | Admitting: Internal Medicine

## 2017-07-12 ENCOUNTER — Other Ambulatory Visit: Payer: Self-pay | Admitting: Emergency Medicine

## 2017-07-12 MED ORDER — LEVOTHYROXINE SODIUM 112 MCG PO TABS: 112.0000 ug | ORAL_TABLET | Freq: Every day | ORAL | 1 refills | Status: DC

## 2017-07-18 ENCOUNTER — Other Ambulatory Visit: Payer: Self-pay | Admitting: Internal Medicine

## 2017-07-18 DIAGNOSIS — Z1231 Encounter for screening mammogram for malignant neoplasm of breast: Secondary | ICD-10-CM

## 2017-07-30 ENCOUNTER — Other Ambulatory Visit: Payer: Self-pay | Admitting: Internal Medicine

## 2017-08-08 ENCOUNTER — Ambulatory Visit: Payer: Self-pay

## 2017-09-14 ENCOUNTER — Other Ambulatory Visit: Payer: Self-pay | Admitting: Internal Medicine

## 2017-09-14 MED ORDER — METFORMIN HCL 1000 MG PO TABS: ORAL_TABLET | ORAL | 0 refills | Status: DC

## 2017-09-14 NOTE — Addendum Note (Signed)
Addended by: MITCHELL, TAYLOR B on: 09/14/2017 03:31 PM   Modules accepted: Orders  

## 2017-09-14 NOTE — Addendum Note (Signed)
Addended by: Terence Lux B on: 09/14/2017 07:17 AM   Modules accepted: Orders

## 2017-10-30 NOTE — Patient Instructions (Addendum)
Have an eye exam - have a report sent to [place].    Test(s) ordered today. Your results will be released to [place] (or called to you) after review, usually within 72hours after test completion. If any changes need to be made, you will be notified at that same time.    Medications reviewed and updated.  Changes include trying ozempic once a week for your sugars if your insurance covers it.   Your prescription(s) have been submitted to your pharmacy. Please take as directed and contact our office if you believe you are having problem(s) with the medication(s).   Please followup in 6 months

## 2017-10-30 NOTE — Progress Notes (Signed)
Subjective:    Patient ID: Sylvia Conner, female    DOB: 06/07/1955, 62 y.o.   MRN: [id number]  HPI The patient is here for follow up.  She has gained weight.    Hypertension: She is taking her medication daily. She is compliant with a low sodium diet.  She denies chest pain, palpitations, shortness of breath and regular headaches. She is active at work, but does not exercise.     Hypothyroidism:  She is taking her medication daily.  She denies any recent changes in energy or weight that are unexplained.  She denies changes in her hair and skin.    Diabetes: She is taking her medication daily as prescribed. She is compliant with a diabetic diet. She is is very active, but not exercising regularly. She monitors her sugars and they have been running 130's, rarely 160. She checks her feet daily and denies foot lesions. She is not up-to-date with an ophthalmology examination.     Medications and allergies reviewed with patient and updated if appropriate.  Patient Active Problem List   Diagnosis Date Noted  . Lipoma of arm 05/03/2017  . Eczema 12/24/2016  . Left lower quadrant pain 05/04/2016  . Pain in joint, shoulder region 10/31/2015  . Adnexal mass 10/31/2015  . HTN (hypertension) 05/24/2011  . DM (diabetes mellitus) ([place]) 05/24/2011  . Hypothyroidism 05/24/2011  . History of hysterectomy 05/24/2011    Current Outpatient Medications on File Prior to Visit  Medication Sig Dispense Refill  . aspirin EC 81 MG tablet Take 81 mg by mouth daily.    . clobetasol ointment (TEMOVATE) 7.98 % Apply 1 application topically 2 (two) times daily. Do not use for more than two weeks at a time 45 g 2  . Diclofenac Sodium CR (VOLTAREN-XR) 100 MG 24 hr tablet Take 1 tablet (100 mg total) by mouth daily. 10 tablet 0  . Insulin Glargine (TOUJEO SOLOSTAR) 300 UNIT/ML SOPN Inject 14 Units into the skin at bedtime. 15 pen 1  . KLOR-CON M20 20 MEQ tablet TAKE 1 TABLET BY MOUTH ONCE DAILY 90 tablet 1    . levothyroxine (SYNTHROID, LEVOTHROID) 112 MCG tablet Take 1 tablet (112 mcg total) by mouth daily before breakfast. 90 tablet 1  . metFORMIN (GLUCOPHAGE) 1000 MG tablet TAKE 1 TABLET BY MOUTH TWICE DAILY WITH A MEAL 180 tablet 0  . Multiple Vitamins-Minerals (HAIR SKIN AND NAILS FORMULA PO) Take 1 tablet by mouth daily.    . potassium chloride SA (K-DUR,KLOR-CON) 20 MEQ tablet Take 1 tablet (20 mEq total) by mouth daily. 90 tablet 1  . triamterene-hydrochlorothiazide (MAXZIDE) 75-50 MG tablet Take 1 tablet by mouth every morning. 90 tablet 1   No current facility-administered medications on file prior to visit.     Past Medical History:  Diagnosis Date  . Anemia   . Arthritis   . Diabetes mellitus   . Hypertension   . Thyroid disease   . UTI (lower urinary tract infection)     Past Surgical History:  Procedure Laterality Date  . CESAREAN SECTION    . COLONOSCOPY    . PARTIAL HYSTERECTOMY     1995    Social History   Socioeconomic History  . Marital status: Married    Spouse name: Not on file  . Number of children: Not on file  . Years of education: Not on file  . Highest education level: Not on file  Occupational History  . Not on file  Social Needs  .  Financial resource strain: Not on file  . Food insecurity:    Worry: Not on file    Inability: Not on file  . Transportation needs:    Medical: Not on file    Non-medical: Not on file  Tobacco Use  . Smoking status: Never Smoker  . Smokeless tobacco: Never Used  Substance and Sexual Activity  . Alcohol use: No  . Drug use: No  . Sexual activity: Not on file  Lifestyle  . Physical activity:    Days per week: Not on file    Minutes per session: Not on file  . Stress: Not on file  Relationships  . Social connections:    Talks on phone: Not on file    Gets together: Not on file    Attends religious service: Not on file    Active member of club or organization: Not on file    Attends meetings of clubs or  organizations: Not on file    Relationship status: Not on file  Other Topics Concern  . Not on file  Social History Narrative  . Not on file    Family History  Problem Relation Age of Onset  . Hypertension Mother   . Diabetes Mother   . Diabetes Unknown   . Diabetes Sister   . Diabetes Paternal Grandmother     Review of Systems  Constitutional: Negative for chills, fatigue and fever.  Respiratory: Negative for cough, shortness of breath and wheezing.   Cardiovascular: Positive for leg swelling (occ, ankles). Negative for chest pain and palpitations.  Neurological: Negative for light-headedness and headaches.       Objective:   Vitals:   11/01/17 0805  BP: 122/64  Pulse: 76  Resp: 16  Temp: 98.6 F (37 C)  SpO2: 97%   BP Readings from Last 3 Encounters:  11/01/17 122/64  05/03/17 114/80  12/24/16 130/80   Wt Readings from Last 3 Encounters:  11/01/17 159 lb (72.1 kg)  05/03/17 155 lb (70.3 kg)  12/24/16 146 lb (66.2 kg)   Body mass index is 29.08 kg/m.   Physical Exam    Constitutional: Appears well-developed and well-nourished. No distress.  HENT:  Head: Normocephalic and atraumatic.  Neck: Neck supple. No tracheal deviation present. No thyromegaly present.  No cervical lymphadenopathy Cardiovascular: Normal rate, regular rhythm and normal heart sounds.   No murmur heard. No carotid bruit .  No edema Pulmonary/Chest: Effort normal and breath sounds normal. No respiratory distress. No has no wheezes. No rales.  Skin: Skin is warm and dry. Not diaphoretic.  Psychiatric: Normal mood and affect. Behavior is normal.      Assessment & Plan:    See Problem List for Assessment and Plan of chronic medical problems.

## 2017-11-01 ENCOUNTER — Ambulatory Visit (INDEPENDENT_AMBULATORY_CARE_PROVIDER_SITE_OTHER): Payer: Self-pay | Admitting: Internal Medicine

## 2017-11-01 ENCOUNTER — Encounter: Payer: Self-pay | Admitting: Internal Medicine

## 2017-11-01 ENCOUNTER — Other Ambulatory Visit (INDEPENDENT_AMBULATORY_CARE_PROVIDER_SITE_OTHER): Payer: Self-pay

## 2017-11-01 VITALS — BP 122/64 | HR 76 | Temp 98.6°F | Resp 16 | Wt 159.0 lb

## 2017-11-01 DIAGNOSIS — Z794 Long term (current) use of insulin: Secondary | ICD-10-CM

## 2017-11-01 DIAGNOSIS — E119 Type 2 diabetes mellitus without complications: Secondary | ICD-10-CM

## 2017-11-01 DIAGNOSIS — E039 Hypothyroidism, unspecified: Secondary | ICD-10-CM

## 2017-11-01 DIAGNOSIS — I10 Essential (primary) hypertension: Secondary | ICD-10-CM

## 2017-11-01 LAB — LIPID PANEL
CHOLESTEROL: 220 mg/dL — AB (ref 0–200)
HDL: 83.9 mg/dL (ref >39.00)
LDL Cholesterol: 127 mg/dL — ABNORMAL HIGH (ref 0–99)
NonHDL: 136.18
TRIGLYCERIDES: 44 mg/dL (ref 0.0–149.0)
Total CHOL/HDL Ratio: 3
VLDL: 8.8 mg/dL (ref 0.0–40.0)

## 2017-11-01 LAB — COMPREHENSIVE METABOLIC PANEL
ALT: 17 U/L (ref 0–35)
AST: 27 U/L (ref 0–37)
Albumin: 4.1 g/dL (ref 3.5–5.2)
Alkaline Phosphatase: 79 U/L (ref 39–117)
BILIRUBIN TOTAL: 0.5 mg/dL (ref 0.2–1.2)
BUN: 15 mg/dL (ref 6–23)
CALCIUM: 9.5 mg/dL (ref 8.4–10.5)
CO2: 31 meq/L (ref 19–32)
Chloride: 99 mEq/L (ref 96–112)
Creatinine, Ser: 0.8 mg/dL (ref 0.40–1.20)
GFR: 93.89 mL/min (ref >60.00)
Glucose, Bld: 122 mg/dL — ABNORMAL HIGH (ref 70–99)
Potassium: 3.5 mEq/L (ref 3.5–5.1)
Sodium: 136 mEq/L (ref 135–145)
TOTAL PROTEIN: 7.7 g/dL (ref 6.0–8.3)

## 2017-11-01 LAB — MICROALBUMIN / CREATININE URINE RATIO
CREATININE, U: 55 mg/dL
MICROALB/CREAT RATIO: 1.3 mg/g (ref 0.0–30.0)

## 2017-11-01 LAB — HEMOGLOBIN A1C: Hgb A1c MFr Bld: 7 % — ABNORMAL HIGH (ref 4.6–6.5)

## 2017-11-01 LAB — TSH: TSH: 4 u[IU]/mL (ref 0.35–4.50)

## 2017-11-01 MED ORDER — METFORMIN HCL 1000 MG PO TABS: ORAL_TABLET | ORAL | 0 refills | Status: DC

## 2017-11-01 MED ORDER — SEMAGLUTIDE(0.25 OR 0.5MG/DOS) 2 MG/1.5ML ~~LOC~~ SOPN: PEN_INJECTOR | SUBCUTANEOUS | 5 refills | Status: DC

## 2017-11-01 MED ORDER — POTASSIUM CHLORIDE CRYS ER 20 MEQ PO TBCR: 20.0000 meq | EXTENDED_RELEASE_TABLET | Freq: Every day | ORAL | 1 refills | Status: DC

## 2017-11-01 MED ORDER — LEVOTHYROXINE SODIUM 112 MCG PO TABS: 112.0000 ug | ORAL_TABLET | Freq: Every day | ORAL | 1 refills | Status: DC

## 2017-11-01 MED ORDER — TRIAMTERENE-HCTZ 75-50 MG PO TABS: 1.0000 | ORAL_TABLET | Freq: Every morning | ORAL | 1 refills | Status: DC

## 2017-11-01 NOTE — Assessment & Plan Note (Signed)
Check tsh  Titrate med dose if needed  

## 2017-11-01 NOTE — Assessment & Plan Note (Addendum)
Check a1c, urine micro Low sugar / carb diet Stressed regular exercise, weight loss Will see if ozempic is covered - if so will try to transition off insulin - may need a third oral medication

## 2017-11-01 NOTE — Assessment & Plan Note (Signed)
BP well controlled Current regimen effective and well tolerated Continue current medications at current doses cmp  

## 2017-11-09 ENCOUNTER — Other Ambulatory Visit: Payer: Self-pay | Admitting: Internal Medicine

## 2017-11-09 DIAGNOSIS — E7849 Other hyperlipidemia: Secondary | ICD-10-CM

## 2017-11-09 MED ORDER — ATORVASTATIN CALCIUM 10 MG PO TABS: 10.0000 mg | ORAL_TABLET | Freq: Every day | ORAL | 3 refills | Status: DC

## 2017-11-17 ENCOUNTER — Other Ambulatory Visit: Payer: Self-pay | Admitting: Internal Medicine

## 2017-11-21 ENCOUNTER — Telehealth: Payer: Self-pay | Admitting: Internal Medicine

## 2017-11-21 NOTE — Telephone Encounter (Signed)
Pt came in the office to get samples.

## 2017-11-21 NOTE — Telephone Encounter (Signed)
LVM letting pt know we have samples of insulin in the office for her to pick up.

## 2017-11-21 NOTE — Telephone Encounter (Signed)
Copied from [place] [id number]. Topic: General - Other >> Nov 21, 2017  8:31 AM Synthia Innocent wrote: Reason for CRM: Patient has been off of insulin for 2 days, unable to pick up from pharmacy due to waiting on copy of insurance card. Samples available until she can get a copy of card? Starting to have headaches/diarrhea due to not having insulin

## 2017-12-12 ENCOUNTER — Ambulatory Visit: Payer: Self-pay | Admitting: Family

## 2017-12-12 ENCOUNTER — Emergency Department (HOSPITAL_COMMUNITY)
Admission: EM | Admit: 2017-12-12 | Discharge: 2017-12-12 | Disposition: A | Discharge status: Home / Self Care | Payer: Self-pay | Attending: Emergency Medicine | Admitting: Emergency Medicine

## 2017-12-12 ENCOUNTER — Ambulatory Visit: Payer: Self-pay

## 2017-12-12 ENCOUNTER — Other Ambulatory Visit: Payer: Self-pay

## 2017-12-12 ENCOUNTER — Encounter (HOSPITAL_COMMUNITY): Payer: Self-pay | Admitting: *Deleted

## 2017-12-12 DIAGNOSIS — E119 Type 2 diabetes mellitus without complications: Secondary | ICD-10-CM | POA: Insufficient documentation

## 2017-12-12 DIAGNOSIS — M62838 Other muscle spasm: Secondary | ICD-10-CM | POA: Insufficient documentation

## 2017-12-12 DIAGNOSIS — Z79899 Other long term (current) drug therapy: Secondary | ICD-10-CM | POA: Insufficient documentation

## 2017-12-12 DIAGNOSIS — M5412 Radiculopathy, cervical region: Secondary | ICD-10-CM | POA: Insufficient documentation

## 2017-12-12 DIAGNOSIS — I10 Essential (primary) hypertension: Secondary | ICD-10-CM | POA: Insufficient documentation

## 2017-12-12 DIAGNOSIS — E039 Hypothyroidism, unspecified: Secondary | ICD-10-CM | POA: Insufficient documentation

## 2017-12-12 DIAGNOSIS — Z794 Long term (current) use of insulin: Secondary | ICD-10-CM | POA: Insufficient documentation

## 2017-12-12 DIAGNOSIS — Z7982 Long term (current) use of aspirin: Secondary | ICD-10-CM | POA: Insufficient documentation

## 2017-12-12 MED ORDER — IBUPROFEN 200 MG PO TABS
600.0000 mg | ORAL_TABLET | Freq: Once | ORAL | Status: AC
  Administered 2017-12-12: 600 mg via ORAL
  Filled 2017-12-12: qty 3

## 2017-12-12 MED ORDER — METHOCARBAMOL 500 MG PO TABS: 500.0000 mg | ORAL_TABLET | Freq: Two times a day (BID) | ORAL | 0 refills | Status: DC

## 2017-12-12 MED ORDER — IBUPROFEN 600 MG PO TABS: 600.0000 mg | ORAL_TABLET | Freq: Four times a day (QID) | ORAL | 0 refills | Status: DC | PRN

## 2017-12-12 NOTE — Telephone Encounter (Signed)
Pt. Reports she woke up last Wed. With right sided neck pain."I thought I had a crook in my neck" It has progressed into her shoulder,arm and hurts into right chest when she lifts her arm."I can't lift my arm over my head because it hurts and it falls down to my side ." Reports she can pick things up with that right hand. Denies any shortness of breath. Appointment made for today. No availability with Dr. Quay Burow.  Reason for Disposition . [1] Unable to use arm at all AND [2] because of shoulder pain or stiffness  Answer Assessment - Initial Assessment Questions 1. ONSET: "When did the pain start?"     Started last Wed. 2. LOCATION: "Where is the pain located?"     Neck, shoulder,arm and chest 3. PAIN: "How bad is the pain?" (Scale 1-10; or mild, moderate, severe)   - MILD (1-3): doesn't interfere with normal activities   - MODERATE (4-7): interferes with normal activities (e.g., work or school) or awakens from sleep   - SEVERE (8-10): excruciating pain, unable to do any normal activities, unable to move arm at all due to pain     8 4. WORK OR EXERCISE: "Has there been any recent work or exercise that involved this part of the body?"     No 5. CAUSE: "What do you think is causing the shoulder pain?"     I thought I slept crooked. 6. OTHER SYMPTOMS: "Do you have any other symptoms?" (e.g., neck pain, swelling, rash, fever, numbness, weakness)     No shortness of breath. Has chest pain when moves right arm. 7. PREGNANCY: "Is there any chance you are pregnant?" "When was your last menstrual period?"     No  Protocols used: SHOULDER PAIN-A-AH

## 2017-12-12 NOTE — ED Triage Notes (Signed)
Woke up last Wednesday with a "crick in my neck" it continues with difficulty lifting rt arm, states arm was feeling cold last night. Limited movement in arm

## 2017-12-12 NOTE — ED Provider Notes (Signed)
[facility] DEPT Provider Note   CSN: [id number] Arrival date & time: 12/12/17  1218     History   Chief Complaint Chief Complaint  Patient presents with  . Neck Pain    HPI Sylvia Conner is a 62 y.o. female with history of hypertension, diabetes who presents with a 5-day history of right upper shoulder pain.  Patient has had pain radiating down her right arm.  She is also had some occasional tingling and feels like her arm is cold.  She has difficulty raising her arm due to the pain.  She describes a tight sensation in her right upper shoulder and right-sided neck.  She reports she felt like she woke up with a crick in her neck when it started and has gotten worse.  She is taking aspirin, ibuprofen and Tylenol at home without relief.  She has not tried any other interventions.  She denies any shortness of breath.  She reports some muscle strain feeling in her right upper chest, but denies any other chest pain.  She also denies any abdominal pain, nausea, vomiting.  She denies any current numbness or tingling in her arm.  HPI  Past Medical History:  Diagnosis Date  . Anemia   . Arthritis   . Diabetes mellitus   . Hypertension   . Thyroid disease   . UTI (lower urinary tract infection)     Patient Active Problem List   Diagnosis Date Noted  . Lipoma of arm 05/03/2017  . Eczema 12/24/2016  . Left lower quadrant pain 05/04/2016  . Pain in joint, shoulder region 10/31/2015  . Adnexal mass 10/31/2015  . HTN (hypertension) 05/24/2011  . DM (diabetes mellitus) ([place]) 05/24/2011  . Hypothyroidism 05/24/2011  . History of hysterectomy 05/24/2011    Past Surgical History:  Procedure Laterality Date  . CESAREAN SECTION    . COLONOSCOPY    . PARTIAL HYSTERECTOMY     1995     OB History   None      Home Medications    Prior to Admission medications   Medication Sig Start Date End Date Taking? Authorizing Provider  aspirin EC 81 MG tablet  Take 81 mg by mouth daily.   Yes [provider]  atorvastatin (LIPITOR) 10 MG tablet Take 1 tablet (10 mg total) by mouth daily. 11/09/17  Yes Burns, Claudina Lick, MD  clobetasol ointment (TEMOVATE) 2.67 % Apply 1 application topically 2 (two) times daily. Do not use for more than two weeks at a time 05/03/17  Yes Burns, Claudina Lick, MD  Insulin Glargine (TOUJEO SOLOSTAR) 300 UNIT/ML SOPN Inject 14 Units into the skin at bedtime. 12/25/16  Yes Burns, Claudina Lick, MD  levothyroxine (SYNTHROID, LEVOTHROID) 112 MCG tablet Take 1 tablet (112 mcg total) by mouth daily before breakfast. 11/01/17  Yes Burns, Claudina Lick, MD  metFORMIN (GLUCOPHAGE) 1000 MG tablet TAKE 1 TABLET BY MOUTH TWICE DAILY WITH A MEAL 11/01/17  Yes Burns, Claudina Lick, MD  Multiple Vitamins-Minerals (HAIR SKIN AND NAILS FORMULA PO) Take 1 tablet by mouth daily.   Yes [provider]  potassium chloride SA (K-DUR,KLOR-CON) 20 MEQ tablet Take 1 tablet (20 mEq total) by mouth daily. 11/01/17  Yes Burns, Claudina Lick, MD  TOUJEO SOLOSTAR 300 UNIT/ML SOPN INJECT 10 UNITS SUBCUTANEOUSLY AT BEDTIME 11/18/17  Yes Burns, Claudina Lick, MD  triamterene-hydrochlorothiazide (MAXZIDE) 75-50 MG tablet Take 1 tablet by mouth every morning. 11/01/17  Yes Burns, Claudina Lick, MD  Diclofenac Sodium CR (  VOLTAREN-XR) 100 MG 24 hr tablet Take 1 tablet (100 mg total) by mouth daily. Patient not taking: Reported on 12/12/2017 11/03/16   Palumbo, April, MD  ibuprofen (ADVIL,MOTRIN) 600 MG tablet Take 1 tablet (600 mg total) by mouth every 6 (six) hours as needed. 12/12/17   Cyera Balboni, Bea Graff, PA-C  methocarbamol (ROBAXIN) 500 MG tablet Take 1 tablet (500 mg total) by mouth 2 (two) times daily. 12/12/17   Laporcha Marchesi, Bea Graff, PA-C  Semaglutide (OZEMPIC) 0.25 or 0.5 MG/DOSE SOPN Inject 0.25 mg into the skin once a week for 30 days, THEN 0.5 mg once a week. 11/01/17 04/15/19  Binnie Rail, MD    Family History Family History  Problem Relation Age of Onset  . Hypertension Mother   . Diabetes  Mother   . Diabetes Unknown   . Diabetes Sister   . Diabetes Paternal Grandmother     Social History Social History   Tobacco Use  . Smoking status: Never Smoker  . Smokeless tobacco: Never Used  Substance Use Topics  . Alcohol use: No  . Drug use: No     Allergies   Penicillins; Januvia [sitagliptin]; Sulfa antibiotics; and Scallops [shellfish allergy]   Review of Systems Review of Systems  Constitutional: Negative for chills and fever.  HENT: Negative for facial swelling and sore throat.   Respiratory: Negative for shortness of breath.   Cardiovascular: Negative for chest pain.  Gastrointestinal: Negative for abdominal pain, nausea and vomiting.  Genitourinary: Negative for dysuria.  Musculoskeletal: Positive for myalgias and neck pain. Negative for back pain.  Skin: Negative for rash and wound.  Neurological: Negative for headaches.  Psychiatric/Behavioral: The patient is not nervous/anxious.      Physical Exam Updated Vital Signs BP (!) 125/91 (BP Location: Left Arm)   Pulse 73   Temp 98.4 F (36.9 C) (Oral)   Resp 17   Ht 5\' 2"  (1.575 m)   Wt 71.2 kg   SpO2 99%   BMI 28.72 kg/m   Physical Exam  Constitutional: She appears well-developed and well-nourished. No distress.  HENT:  Head: Normocephalic and atraumatic.  Mouth/Throat: Oropharynx is clear and moist. No oropharyngeal exudate.  Eyes: Pupils are equal, round, and reactive to light. Conjunctivae are normal. Right eye exhibits no discharge. Left eye exhibits no discharge. No scleral icterus.  Neck: Normal range of motion. Neck supple. Muscular tenderness present. No thyromegaly present.    Muscle spasm and tenderness to the right upper trapezius  Cardiovascular: Normal rate, regular rhythm, normal heart sounds and intact distal pulses. Exam reveals no gallop and no friction rub.  No murmur heard. Pulmonary/Chest: Effort normal and breath sounds normal. No stridor. No respiratory distress. She has  no wheezes. She has no rales.  Abdominal: Soft. Bowel sounds are normal. She exhibits no distension. There is no tenderness. There is no rebound and no guarding.  Musculoskeletal: She exhibits no edema.  Sensation intact and equal bilateral grip strength bilaterally 4/5 strength on the right upper extremity with extension, 5/5 strength with flexion bilaterally  Lymphadenopathy:    She has no cervical adenopathy.  Neurological: She is alert. Coordination normal.  Skin: Skin is warm and dry. No rash noted. She is not diaphoretic. No pallor.  Psychiatric: She has a normal mood and affect.  Nursing note and vitals reviewed.    ED Treatments / Results  Labs (all labs ordered are listed, but only abnormal results are displayed) Labs Reviewed - No data to display  EKG None  Radiology No results found.  Procedures Procedures (including critical care time)  Medications Ordered in ED Medications  ibuprofen (ADVIL,MOTRIN) tablet 600 mg (600 mg Oral Given 12/12/17 1428)     Initial Impression / Assessment and Plan / ED Course  I have reviewed the triage vital signs and the nursing notes.  Pertinent labs & imaging results that were available during my care of the patient were reviewed by me and considered in my medical decision making (see chart for details).     Patient presenting with probable upper trapezius muscle spasm with cervical radiculopathy.  Patient given ibuprofen and heat pad.  Patient drove here and wants to drive home, so will discharge home with muscle relaxer.  Patient is neurovascularly intact.  EKG shows NSR.  Patient feeling better after ibuprofen and heating pad in the ED.  She has better use of her arm.  I have very low suspicion of any emergent need for further workup. I advised possible need for outpatient MRI. Recheck at PCP in 2-3 days for recheck. Stretches discussed.  Return precautions discussed.  Patient understands and agrees with plan.  Patient vitals stable  throughout ED course and discharged in satisfactory condition.  Final Clinical Impressions(s) / ED Diagnoses   Final diagnoses:  Muscle spasms of neck  Cervical radiculopathy    ED Discharge Orders         Ordered    methocarbamol (ROBAXIN) 500 MG tablet  2 times daily     12/12/17 1624    ibuprofen (ADVIL,MOTRIN) 600 MG tablet  Every 6 hours PRN     12/12/17 1624           Dalante Minus, Bea Graff, PA-C 12/12/17 [place], [address], DO 12/12/17 2047

## 2017-12-12 NOTE — Discharge Instructions (Addendum)
Take ibuprofen every 6 hours as prescribed.  Take Robaxin twice daily as needed for muscle pain or spasms.  Do not drive or operate machinery while taking this medication.  Use heat alternating 20 minutes on, 20 minutes off, at least 3-4 times daily.  You can alternate with ice in the same manner.  After heating, attempt the stretches we discussed several times daily.  Please follow-up with your doctor at the end of the week for for recheck.  You may need referral to neurosurgery and outpatient MRI if symptoms not improving.  Please return the emergency department if you develop any new or worsening symptoms.

## 2017-12-14 ENCOUNTER — Ambulatory Visit: Payer: Self-pay | Admitting: Family Medicine

## 2017-12-14 ENCOUNTER — Encounter: Payer: Self-pay | Admitting: Family Medicine

## 2017-12-14 ENCOUNTER — Ambulatory Visit (INDEPENDENT_AMBULATORY_CARE_PROVIDER_SITE_OTHER): Payer: Self-pay

## 2017-12-14 VITALS — BP 136/78 | HR 88 | Ht 62.0 in | Wt 158.0 lb

## 2017-12-14 DIAGNOSIS — M25511 Pain in right shoulder: Secondary | ICD-10-CM

## 2017-12-14 MED ORDER — PREDNISONE 5 MG PO TABS: ORAL_TABLET | ORAL | 0 refills | Status: DC

## 2017-12-14 NOTE — Patient Instructions (Addendum)
Nice to meet you  Please try the exercises  I will call you with the results from today  Please follow up with me in 4 weeks

## 2017-12-14 NOTE — Progress Notes (Signed)
Sylvia Conner - 62 y.o. female MRN [id number]  Date of birth: Feb 28, 1956  SUBJECTIVE:  Including CC & ROS.  Chief Complaint  Patient presents with  . Shoulder Pain    Sylvia Conner is a 62 y.o. female that is presenting with right shoulder and neck pain. Ongoing for four days. Pain located at the lateral aspect of her neck and radiates down her arm. Pain is mild to severe during abduction. Admits to limited range of motion. She recalls waking up with her neck and arm feeling tight. Denies tingling or numbness.  She is able to passively raise her right arm to full range of motion but is unable to control the motion down.  She denies any recent viral illnesses.  She denies any recent infection.  Has not had any fevers or chills.  Denies any shortness of breath.  She went to the ER on 12/22/17 was prescribed Robaxin.    Review of Systems  Constitutional: Negative for fever.  HENT: Negative for congestion.   Respiratory: Negative for cough.   Cardiovascular: Negative for chest pain.  Gastrointestinal: Negative for abdominal pain.  Musculoskeletal: Negative for back pain.  Skin: Negative for color change.  Neurological: Positive for weakness.  Hematological: Negative for adenopathy.  Psychiatric/Behavioral: Negative for agitation.    HISTORY: Past Medical, Surgical, Social, and Family History Reviewed & Updated per EMR.   Pertinent Historical Findings include:  Past Medical History:  Diagnosis Date  . Anemia   . Arthritis   . Diabetes mellitus   . Hypertension   . Thyroid disease   . UTI (lower urinary tract infection)     Past Surgical History:  Procedure Laterality Date  . CESAREAN SECTION    . COLONOSCOPY    . PARTIAL HYSTERECTOMY     1995    Allergies  Allergen Reactions  . Penicillins Anaphylaxis and Rash    Has patient had a PCN reaction causing immediate rash, facial/tongue/throat swelling, SOB or lightheadedness with hypotension: Yes Has patient had a PCN  reaction causing severe rash involving mucus membranes or skin necrosis: No Has patient had a PCN reaction that required hospitalization Yes Has patient had a PCN reaction occurring within the last 10 years: No If all of the above answers are "NO", then may proceed with Cephalosporin use.   Celesta Gentile [Sitagliptin] Nausea Only  . Sulfa Antibiotics Swelling  . Scallops [Shellfish Allergy] Hives    Family History  Problem Relation Age of Onset  . Hypertension Mother   . Diabetes Mother   . Diabetes Unknown   . Diabetes Sister   . Diabetes Paternal Grandmother      Social History   Socioeconomic History  . Marital status: Married    Spouse name: Not on file  . Number of children: Not on file  . Years of education: Not on file  . Highest education level: Not on file  Occupational History  . Not on file  Social Needs  . Financial resource strain: Not on file  . Food insecurity:    Worry: Not on file    Inability: Not on file  . Transportation needs:    Medical: Not on file    Non-medical: Not on file  Tobacco Use  . Smoking status: Never Smoker  . Smokeless tobacco: Never Used  Substance and Sexual Activity  . Alcohol use: No  . Drug use: No  . Sexual activity: Not on file  Lifestyle  . Physical activity:    Days per  week: Not on file    Minutes per session: Not on file  . Stress: Not on file  Relationships  . Social connections:    Talks on phone: Not on file    Gets together: Not on file    Attends religious service: Not on file    Active member of club or organization: Not on file    Attends meetings of clubs or organizations: Not on file    Relationship status: Not on file  . Intimate partner violence:    Fear of current or ex partner: Not on file    Emotionally abused: Not on file    Physically abused: Not on file    Forced sexual activity: Not on file  Other Topics Concern  . Not on file  Social History Narrative  . Not on file     PHYSICAL EXAM:    VS: BP 136/78   Pulse 88   Ht 5\' 2"  (1.575 m)   Wt 158 lb (71.7 kg)   SpO2 99%   BMI 28.90 kg/m  Physical Exam Gen: NAD, alert, cooperative with exam, well-appearing ENT: normal lips, normal nasal mucosa,  Eye: normal EOM, normal conjunctiva and lids CV:  no edema, +2 pedal pulses   Resp: no accessory muscle use, non-labored,  Skin: no rashes, no areas of induration  Neuro: normal tone, normal sensation to touch Psych:  normal insight, alert and oriented MSK:  Neck:  Mild TTp of the right trapezius  Normal ROM  Normal grip strength  No signs of atrophy  Right Shoulder: Inspection reveals no abnormalities, atrophy or asymmetry. Palpation is normal with no tenderness over AC joint Active range of motion is limited to about 30 degrees in flexion and abduction. She is able to passively reach full range of motion in flexion and abduction.  Has a drop arm and cannot control the movement coming from full flexion to 0 degrees. Unable to fully appreciate her test for empty can Positive drop arm sign. Neurovascularly intact   Limited ultrasound: right shoulder:  Supraspinatus appears to be chronically torn with minimal presence at the insertion.  Space between the humeral head and acromion appears to be diminished.  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  She does have the ability to reach her full range of motion but does have a drop arm sign.  She had no inciting event or trauma.  Possible to be originating from her neck.  Has not had any recent developments of any infection or illness to suggest as a source of her current symptoms. -X-ray -Prednisone. -If no improvement may need to try an injection versus EMG

## 2017-12-15 NOTE — Assessment & Plan Note (Signed)
She appears to have chronic tears of her rotator cuff but unclear if this is the source of her problem today.  She does have the ability to reach her full range of motion but does have a drop arm sign.  She had no inciting event or trauma.  Possible to be originating from her neck.  Has not had any recent developments of any infection or illness to suggest as a source of her current symptoms. -X-ray -Prednisone. -If no improvement may need to try an injection versus EMG

## 2017-12-19 ENCOUNTER — Telehealth: Payer: Self-pay | Admitting: *Deleted

## 2017-12-19 NOTE — Telephone Encounter (Signed)
Copied from [place] [id number]. Topic: General - Other >> Dec 19, 2017  1:00 PM Leward Quan A wrote: Reason for CRM:  Patient called to inquire about results of X rays taken on last visit. She request a call back with results.  Please advise  Ph# [phone] >> Dec 19, 2017  2:10 PM Selisa Tensley A, CMA wrote: Left patient a VM to return call, I did not see where patient had the shoulder imaging completed.

## 2017-12-29 ENCOUNTER — Other Ambulatory Visit: Payer: Self-pay

## 2017-12-29 ENCOUNTER — Ambulatory Visit (INDEPENDENT_AMBULATORY_CARE_PROVIDER_SITE_OTHER): Payer: Self-pay

## 2017-12-29 DIAGNOSIS — M25511 Pain in right shoulder: Secondary | ICD-10-CM

## 2017-12-29 NOTE — Telephone Encounter (Signed)
Unable to reach patient, left VM twice

## 2017-12-30 ENCOUNTER — Telehealth: Payer: Self-pay | Admitting: Internal Medicine

## 2017-12-30 NOTE — Telephone Encounter (Signed)
Copied from [place] [id number]. Topic: Quick Communication - See Telephone Encounter >> Dec 30, 2017 10:24 AM Blase Mess A wrote: CRM for notification. See Telephone encounter for: 12/30/17. Patient is calling because she had xrays with [facility] yesterday. However, at this time she is requesting some pain meds.  Please advise Patients call back number [phone]

## 2018-01-03 ENCOUNTER — Telehealth: Payer: Self-pay | Admitting: Family Medicine

## 2018-01-03 ENCOUNTER — Other Ambulatory Visit: Payer: Self-pay | Admitting: Family Medicine

## 2018-01-03 ENCOUNTER — Ambulatory Visit (INDEPENDENT_AMBULATORY_CARE_PROVIDER_SITE_OTHER): Payer: Self-pay

## 2018-01-03 DIAGNOSIS — M25511 Pain in right shoulder: Secondary | ICD-10-CM

## 2018-01-03 NOTE — Telephone Encounter (Signed)
Left VM for patient. If she calls back please have her speak with a nurse/CMA and inform that I need to put in another set up xrays to evaluate an area of concern in her right humerus. The [facility] can report results to patient.   If any questions then please take the best time and phone number to call and I will try to call her back.   Rosemarie Ax, MD [facility] 01/03/2018, 9:42 AM

## 2018-01-03 NOTE — Telephone Encounter (Signed)
Left patient a voice mail

## 2018-01-05 ENCOUNTER — Telehealth: Payer: Self-pay | Admitting: Family Medicine

## 2018-01-05 NOTE — Telephone Encounter (Signed)
Patient returned call- she was notified of xray results- she still has pain and wants to discuss that. She has a break at 11:30 and can be reached then. Her contact number is [phone].

## 2018-01-05 NOTE — Telephone Encounter (Signed)
Left VM for patient. If she calls back please have her speak with a nurse/CMA and inform that her xray is normal. The [facility] can report results to patient.   If any questions then please take the best time and phone number to call and I will try to call her back.   Rosemarie Ax, MD [facility] 01/05/2018, 8:12 AM

## 2018-01-05 NOTE — Telephone Encounter (Signed)
Left patient a voice mail

## 2018-01-06 NOTE — Telephone Encounter (Signed)
Pt states she would like a nurse to call her to see what the plan and next steps are for her being that the xray result was normal and she is still in pain. Please advise.

## 2018-01-06 NOTE — Telephone Encounter (Signed)
Spoke with patient about her question. Would try an injection and may need to try PT afterward.   Rosemarie Ax, MD [facility] 01/06/2018, 1:57 PM

## 2018-01-16 ENCOUNTER — Ambulatory Visit: Payer: Self-pay | Admitting: Surgery

## 2018-01-16 NOTE — H&P (Signed)
History of Present Illness The patient is a 62 year old female who presents with a complaint of Mass.   Referred by Dr. Celso Amy for subcutaneous mass right upper arm and right chest This is a 62 year old female who presents with more than 10 year history of enlarging masses on her right upper lateral arm as well as her right chest. These have caused some discomfort. They are noticeably enlarged. She would like to have these removed. There has not been any imaging of these areas.  She was originally seen in 2/19 but decided to wait to have these removed.     Past Surgical History Cesarean Section - 1 Colon Polyp Removal - Colonoscopy Colon Polyp Removal - Open Hemorrhoidectomy Hysterectomy (not due to cancer) - Partial  Diagnostic Studies History Colonoscopy 1-5 years ago  Allergies Penicillins Allergies Reconciled  Medication History  Levothyroxine Sodium (112MCG Tablet, Oral) Active. MetFORMIN HCl ER (Oral) Specific strength unknown - Active. Toujeo SoloStar (300UNIT/ML Soln Pen-inj, Subcutaneous) Active. Medications Reconciled  Social History  Caffeine use Carbonated beverages, Coffee, Tea. No alcohol use No drug use Tobacco use Never smoker.  Family History  Arthritis Mother. Diabetes Mellitus Mother, Sister. Hypertension Mother, Sister.  Pregnancy / Birth History Age at menarche 47 years. Age of menopause 80-60 Gravida 4 Maternal age 69-20 Para 2  Other Problems Arthritis Diabetes Mellitus High blood pressure Thyroid Disease     Review of Systems  General Present- Night Sweats. Not Present- Appetite Loss, Chills, Fatigue, Fever, Weight Gain and Weight Loss. Skin Not Present- Change in Wart/Mole, Dryness, Hives, Jaundice, New Lesions, Non-Healing Wounds, Rash and Ulcer. HEENT Present- Wears glasses/contact lenses. Not Present- Earache, Hearing Loss, Hoarseness, Nose Bleed, Oral Ulcers, Ringing in the  Ears, Seasonal Allergies, Sinus Pain, Sore Throat, Visual Disturbances and Yellow Eyes. Respiratory Not Present- Bloody sputum, Chronic Cough, Difficulty Breathing, Snoring and Wheezing. Breast Not Present- Breast Mass, Breast Pain, Nipple Discharge and Skin Changes. Cardiovascular Not Present- Chest Pain, Difficulty Breathing Lying Down, Leg Cramps, Palpitations, Rapid Heart Rate, Shortness of Breath and Swelling of Extremities. Female Genitourinary Not Present- Frequency, Nocturia, Painful Urination, Pelvic Pain and Urgency. Musculoskeletal Not Present- Back Pain, Joint Pain, Joint Stiffness, Muscle Pain, Muscle Weakness and Swelling of Extremities. Neurological Not Present- Decreased Memory, Fainting, Headaches, Numbness, Seizures, Tingling, Tremor, Trouble walking and Weakness. Psychiatric Not Present- Anxiety, Bipolar, Change in Sleep Pattern, Depression, Fearful and Frequent crying. Endocrine Present- Hot flashes. Not Present- Cold Intolerance, Excessive Hunger, Hair Changes, Heat Intolerance and New Diabetes. Hematology Not Present- Blood Thinners, Easy Bruising, Excessive bleeding, Gland problems, HIV and Persistent Infections.  Vitals  Weight: 155 lb Height: 62in Body Surface Area: 1.72 m Body Mass Index: 28.35 kg/m  Temp.: 98.17F(Oral)  Pulse: 102 (Regular)  BP: 102/76 (Sitting, Left Arm, Standard)      Physical Exam  The physical exam findings are as follows: Note:WDWN in NAD Eyes: Pupils equal, round; sclera anicteric HENT: Oral mucosa moist; good dentition Neck: No masses palpated, no thyromegaly Lungs: CTA bilaterally; normal respiratory effort Right lateral chest - oval-shaped 3 cm subcutaneous mass, smooth, well-demarcated; mildly tender Right lateral upper arm - protruding 4 cm subcutaneous mass, smooth, well-demarcated, mildly tender CV: Regular rate and rhythm; no murmurs; extremities well-perfused with no edema Abd: +bowel sounds, soft,  non-tender, no palpable organomegaly; no palpable hernias Skin: Warm, dry; no sign of jaundice Psychiatric - alert and oriented x 4; calm mood and affect    Assessment & Plan      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 The surgical procedure has been discussed with the patient. Potential risks, benefits, alternative treatments, and expected outcomes have been explained. All of the patient's questions at this time have been answered. The likelihood of reaching the patient's treatment goal is good. The patient understand the proposed surgical procedure and wishes to proceed.   Sylvia Conner. Georgette Dover, MD, [facility]  General/ Trauma Surgery Beeper [phone]  01/16/2018 9:53 AM

## 2018-01-23 ENCOUNTER — Telehealth: Payer: Self-pay | Admitting: Internal Medicine

## 2018-01-23 NOTE — Telephone Encounter (Signed)
Patient is waiting for insurance company to send her ID cards.  She is wanting to know if she can get help with her insurance or a different type until she can get her insurance cards. / Called the office and spoke with Sam. / She will have a rep bring some to the office, so the patient can come and pick it up. / Sam agreed to notify the patient. / Closing encounter

## 2018-01-23 NOTE — Telephone Encounter (Signed)
Copied from [place] [id number]. Topic: Quick Communication - See Telephone Encounter >> Jan 23, 2018  8:05 AM Sylvia Conner wrote: Pt states that she is having issues with her insurance and it will be November 1 until she can get her cards, but only has about two more doses of her [place]   [phone]

## 2018-01-24 NOTE — Telephone Encounter (Signed)
LVM notifying patient I have 2 pens in the office available for her to pick up at her convenience.

## 2018-02-04 IMAGING — MG MM SCREEN MAMMOGRAM BILATERAL
4 series · 4 of 4 positions shown · non-contrast
Comparison: Previous exam(s).

CLINICAL DATA: Screening.

EXAM:
DIGITAL SCREENING BILATERAL MAMMOGRAM WITH CAD

[R CC]
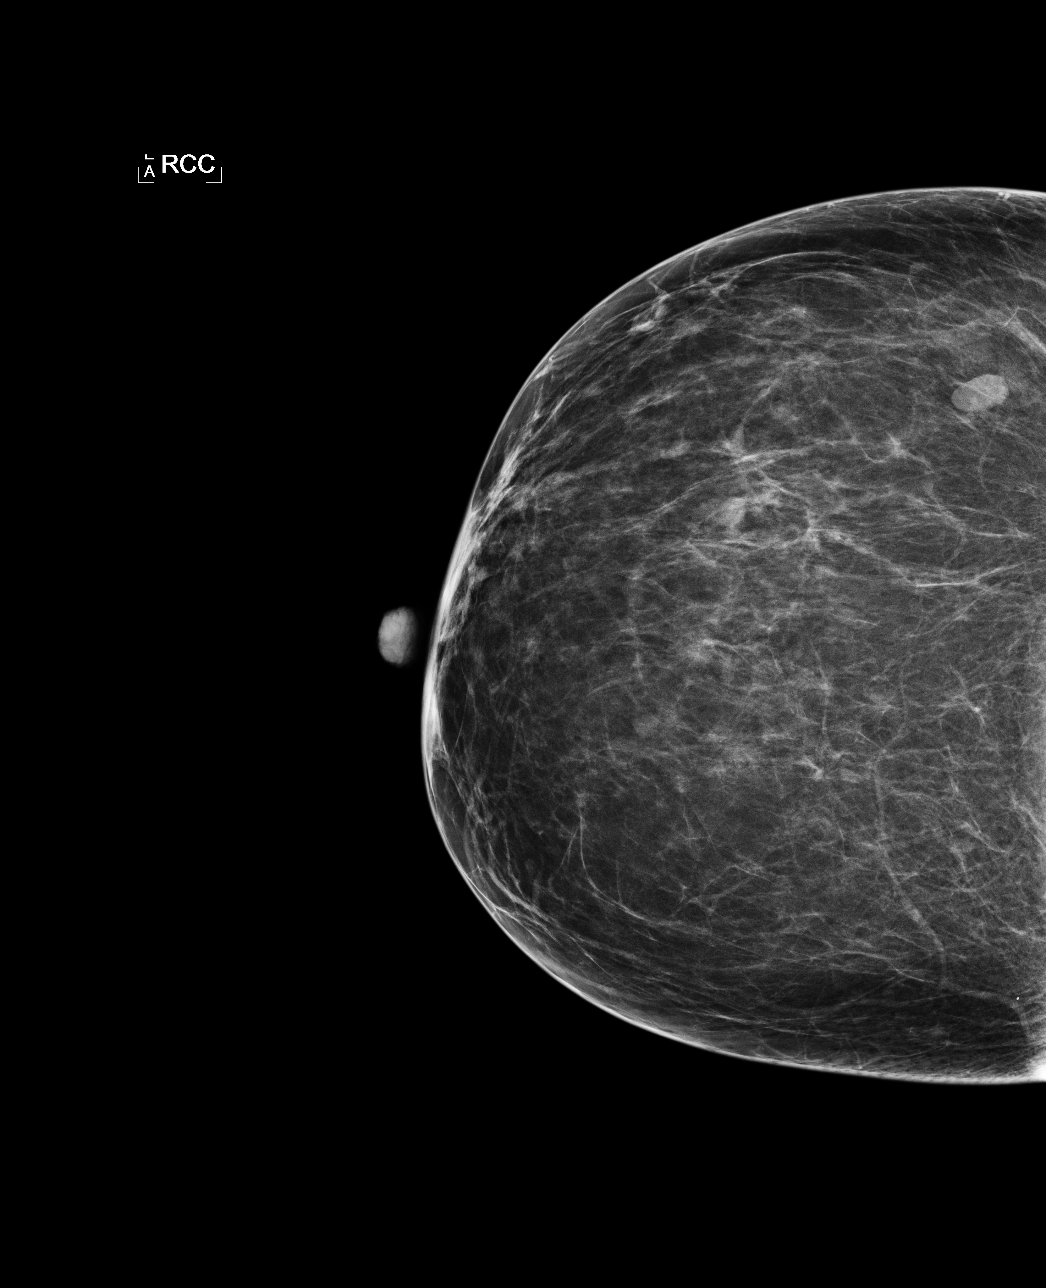

[L CC]
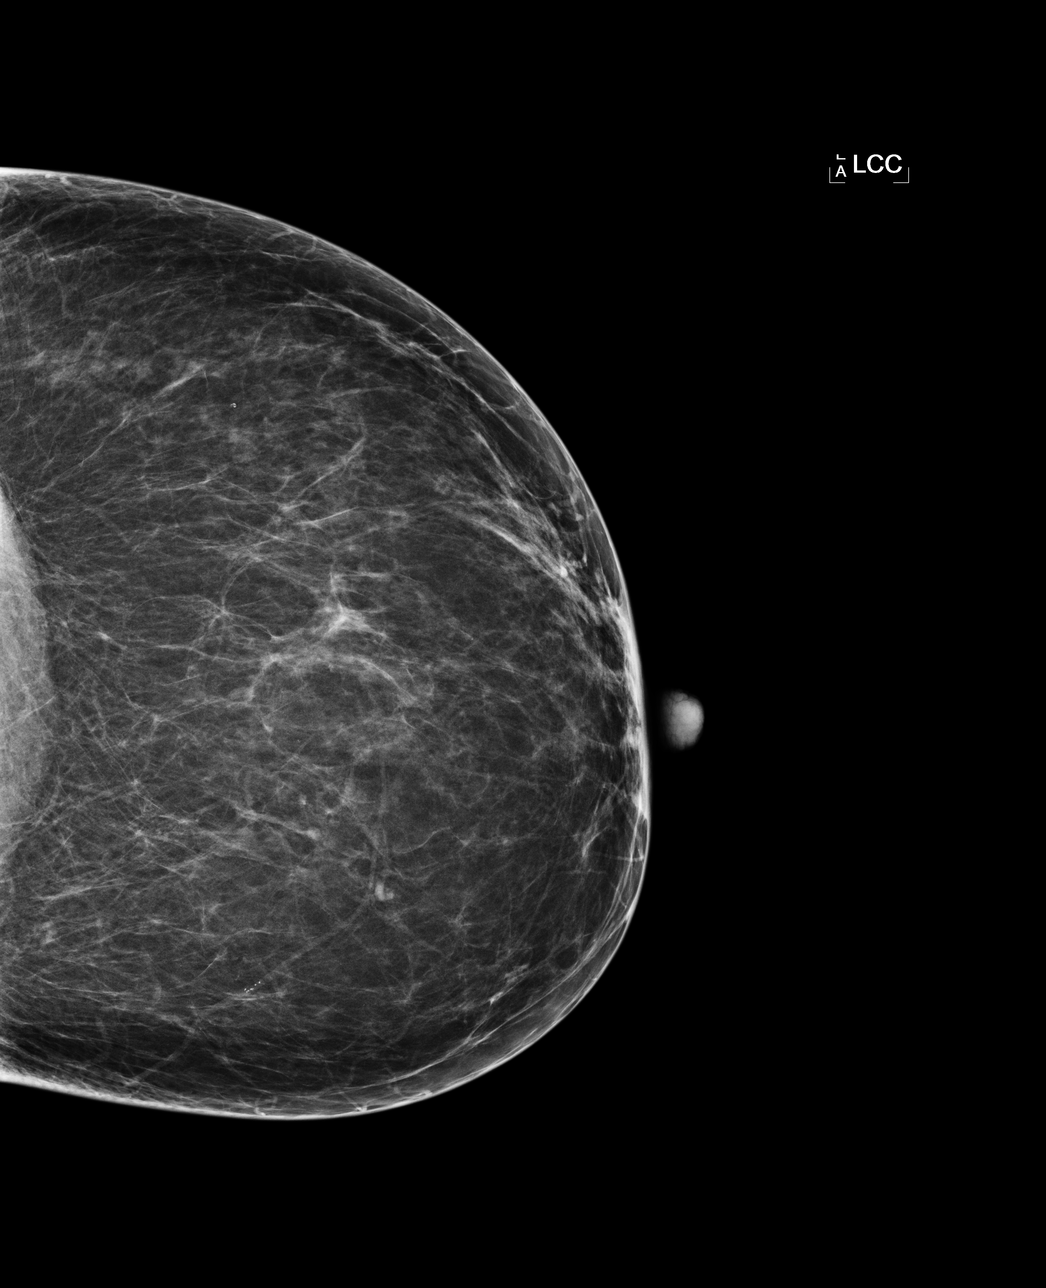

[L MLO]
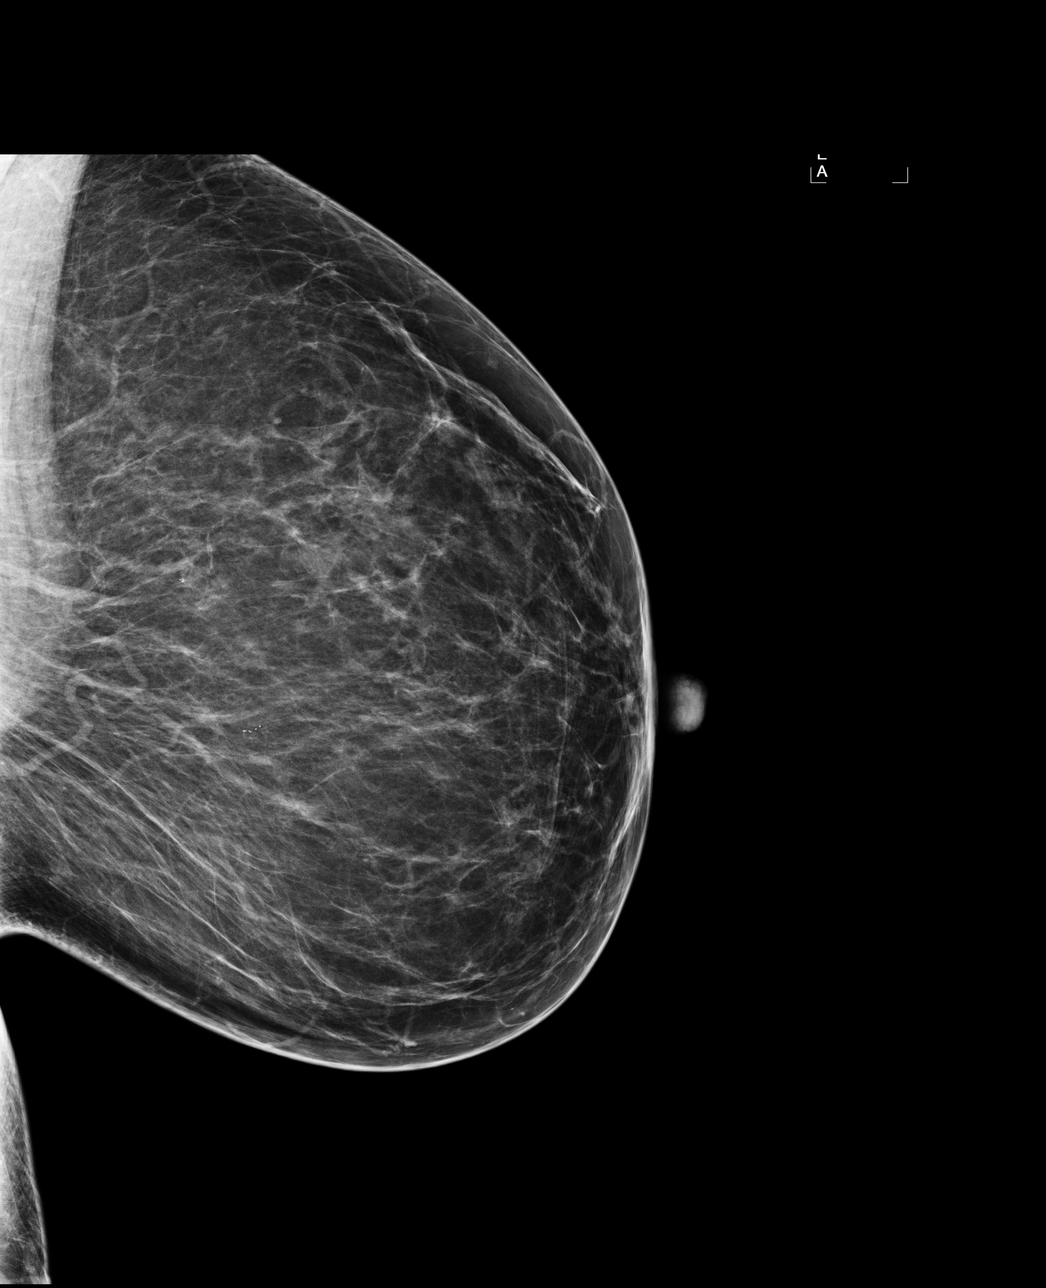

[R MLO]
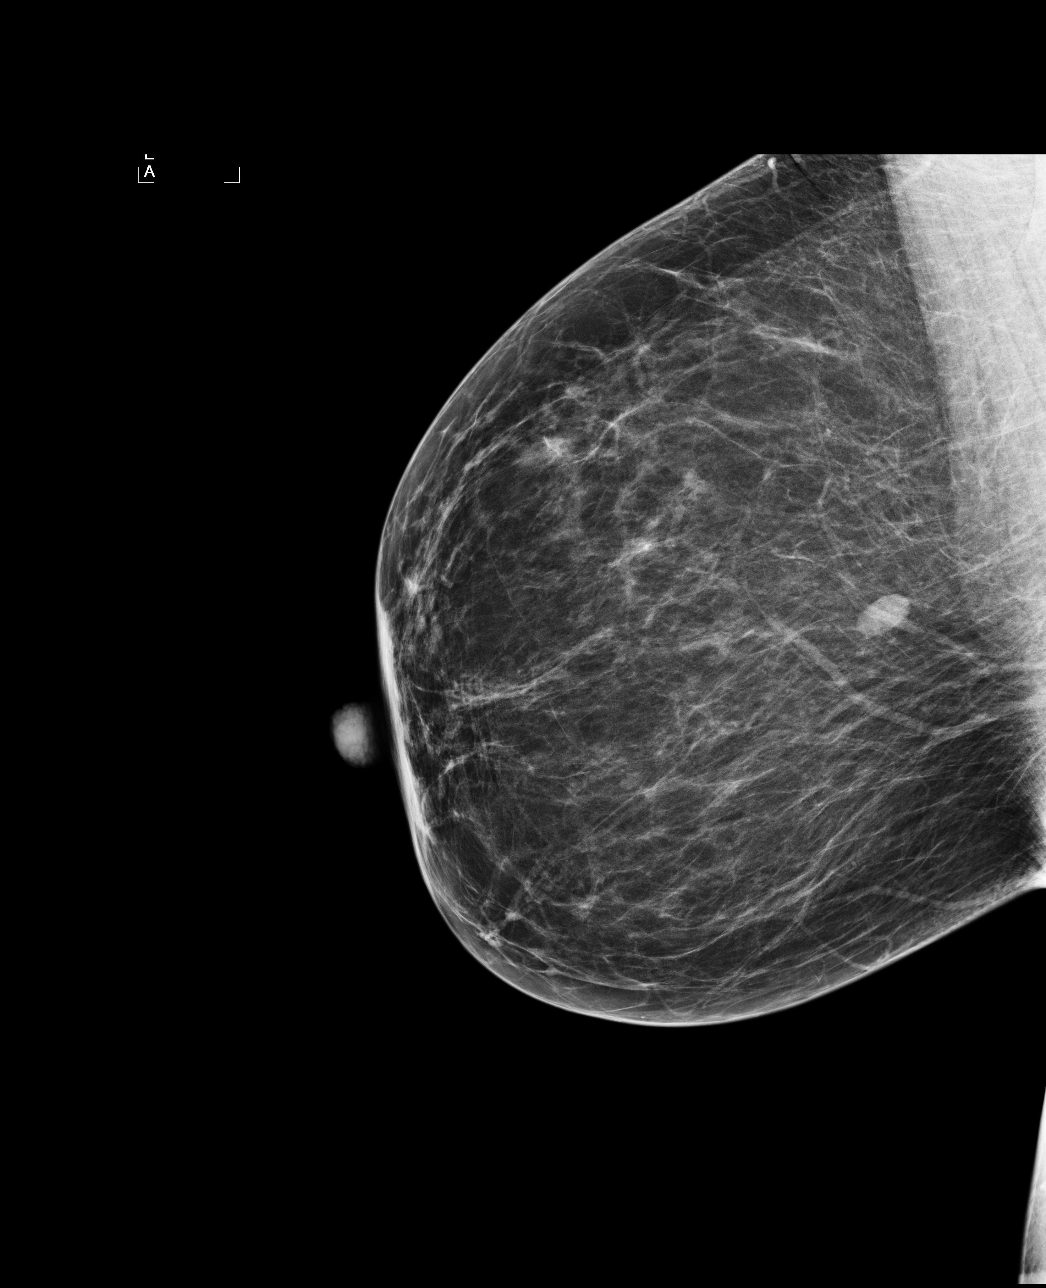

[4 of 4 positions shown; findings below may reference images not displayed]

ACR Breast Density Category b: There are scattered areas of
fibroglandular density.
FINDINGS: There are no findings suspicious for malignancy. Images were
processed with CAD.
IMPRESSION: No mammographic evidence of malignancy. A result letter of this
screening mammogram will be mailed directly to the patient.

RECOMMENDATION:
Screening mammogram in one year. (Code:AS-G-LCT)

BI-RADS CATEGORY  1: Negative.

## 2018-04-24 ENCOUNTER — Telehealth: Payer: Self-pay | Admitting: Internal Medicine

## 2018-04-24 NOTE — Telephone Encounter (Signed)
LVM letting pt know we have samples of each medication for her to pick up.

## 2018-04-24 NOTE — Telephone Encounter (Signed)
Pt. Reports she does not have her Ozempic for this week and can not pick it up until Friday due to new insurance. Is there anything the office can due to help her? She needs a refill if she is to continue this medication.Please advise pt.

## 2018-04-24 NOTE — Telephone Encounter (Signed)
Copied from [place] [id number]. Topic: Quick Communication - See Telephone Encounter >> Apr 24, 2018  8:33 AM Loma Boston wrote: CRM for notification. See Telephone encounter for: 04/24/18.Insulin Glargine (TOUJEO SOLOSTAR) 300 UNIT/ML SOPN Pt changed insurance and is out of meds since 13th, new insurance in effect on this Friday 1/24. Wanting to know what she can possibly do with no insulin in a week?

## 2018-04-29 ENCOUNTER — Other Ambulatory Visit: Payer: Self-pay | Admitting: Internal Medicine

## 2018-05-04 ENCOUNTER — Ambulatory Visit: Payer: Self-pay | Admitting: Internal Medicine

## 2018-06-01 NOTE — Patient Instructions (Addendum)

## 2018-06-01 NOTE — Progress Notes (Signed)
Subjective:    Patient ID: Sylvia Conner, female    DOB: 12-Sep-1955, 63 y.o.   MRN: [id number]  HPI The patient is here for follow up.  Hypertension: She is taking her medication daily. She is compliant with a low sodium diet.  She denies chest pain, palpitations, shortness of breath and regular headaches. She is exercising regularly.  She does not monitor her blood pressure at home.    Hypothyroidism:  She is taking her medication daily.  She denies any recent changes in energy or weight that are unexplained.   Diabetes: She is taking her medication daily as prescribed. She is compliant with a diabetic diet. She is exercising regularly.  She checks her feet daily and denies foot lesions. She is not up-to-date with an ophthalmology examination.     Medications and allergies reviewed with patient and updated if appropriate.  Patient Active Problem List   Diagnosis Date Noted  . Lipoma of arm 05/03/2017  . Eczema 12/24/2016  . Left lower quadrant pain 05/04/2016  . Right shoulder pain 10/31/2015  . Adnexal mass 10/31/2015  . HTN (hypertension) 05/24/2011  . DM (diabetes mellitus) ([place]) 05/24/2011  . Hypothyroidism 05/24/2011  . History of hysterectomy 05/24/2011    Current Outpatient Medications on File Prior to Visit  Medication Sig Dispense Refill  . aspirin EC 81 MG tablet Take 81 mg by mouth daily.    Marland Kitchen atorvastatin (LIPITOR) 10 MG tablet Take 1 tablet (10 mg total) by mouth daily. 90 tablet 3  . clobetasol ointment (TEMOVATE) 3.23 % Apply 1 application topically 2 (two) times daily. Do not use for more than two weeks at a time 45 g 2  . ibuprofen (ADVIL,MOTRIN) 600 MG tablet Take 1 tablet (600 mg total) by mouth every 6 (six) hours as needed. 30 tablet 0  . levothyroxine (SYNTHROID, LEVOTHROID) 112 MCG tablet Take 1 tablet (112 mcg total) by mouth daily before breakfast. 90 tablet 1  . metFORMIN (GLUCOPHAGE) 1000 MG tablet TAKE 1 TABLET BY MOUTH TWICE DAILY WITH A MEAL  180 tablet 0  . Multiple Vitamins-Minerals (HAIR SKIN AND NAILS FORMULA PO) Take 1 tablet by mouth daily.    . potassium chloride SA (K-DUR,KLOR-CON) 20 MEQ tablet Take 1 tablet (20 mEq total) by mouth daily. 90 tablet 1  . Semaglutide (OZEMPIC) 0.25 or 0.5 MG/DOSE SOPN Inject 0.25 mg into the skin once a week for 30 days, THEN 0.5 mg once a week. 1 pen 5  . TOUJEO SOLOSTAR 300 UNIT/ML SOPN INJECT 10 UNITS SUBCUTANEOUSLY AT BEDTIME 15 mL 2  . triamterene-hydrochlorothiazide (MAXZIDE) 75-50 MG tablet TAKE 1 TABLET BY MOUTH ONCE DAILY IN THE MORNING 90 tablet 0   No current facility-administered medications on file prior to visit.     Past Medical History:  Diagnosis Date  . Anemia   . Arthritis   . Diabetes mellitus   . Hypertension   . Thyroid disease   . UTI (lower urinary tract infection)     Past Surgical History:  Procedure Laterality Date  . CESAREAN SECTION    . COLONOSCOPY    . PARTIAL HYSTERECTOMY     1995    Social History   Socioeconomic History  . Marital status: Married    Spouse name: Not on file  . Number of children: Not on file  . Years of education: Not on file  . Highest education level: Not on file  Occupational History  . Not on file  Social Needs  .  Financial resource strain: Not on file  . Food insecurity:    Worry: Not on file    Inability: Not on file  . Transportation needs:    Medical: Not on file    Non-medical: Not on file  Tobacco Use  . Smoking status: Never Smoker  . Smokeless tobacco: Never Used  Substance and Sexual Activity  . Alcohol use: No  . Drug use: No  . Sexual activity: Not on file  Lifestyle  . Physical activity:    Days per week: Not on file    Minutes per session: Not on file  . Stress: Not on file  Relationships  . Social connections:    Talks on phone: Not on file    Gets together: Not on file    Attends religious service: Not on file    Active member of club or organization: Not on file    Attends meetings  of clubs or organizations: Not on file    Relationship status: Not on file  Other Topics Concern  . Not on file  Social History Narrative  . Not on file    Family History  Problem Relation Age of Onset  . Hypertension Mother   . Diabetes Mother   . Diabetes Unknown   . Diabetes Sister   . Diabetes Paternal Grandmother     Review of Systems  Constitutional: Negative for chills, fatigue and fever.  Eyes: Negative for visual disturbance.  Respiratory: Negative for cough, shortness of breath and wheezing.   Cardiovascular: Positive for leg swelling (occasionally). Negative for chest pain and palpitations.  Neurological: Negative for light-headedness, numbness and headaches.       Objective:   Vitals:   06/02/18 0842  BP: (!) 142/82  Pulse: 71  Resp: 16  Temp: 98.3 F (36.8 C)  SpO2: 98%   BP Readings from Last 3 Encounters:  06/02/18 (!) 142/82  12/14/17 136/78  12/12/17 (!) 125/91   Wt Readings from Last 3 Encounters:  06/02/18 157 lb (71.2 kg)  12/14/17 158 lb (71.7 kg)  12/12/17 157 lb (71.2 kg)   Body mass index is 28.72 kg/m.   Physical Exam    Constitutional: Appears well-developed and well-nourished. No distress.  HENT:  Head: Normocephalic and atraumatic.  Neck: Neck supple. No tracheal deviation present. No thyromegaly present.  No cervical lymphadenopathy Cardiovascular: Normal rate, regular rhythm and normal heart sounds.   No murmur heard. No carotid bruit .  No edema Pulmonary/Chest: Effort normal and breath sounds normal. No respiratory distress. No has no wheezes. No rales.  Skin: Skin is warm and dry. Not diaphoretic.  Psychiatric: Normal mood and affect. Behavior is normal.   Diabetic Foot Exam - Simple   Simple Foot Form Diabetic Foot exam was performed with the following findings:  Yes 06/02/2018  9:29 AM  Visual Inspection No deformities, no ulcerations, no other skin breakdown bilaterally:  Yes Sensation Testing Intact to touch and  monofilament testing bilaterally:  Yes Pulse Check Posterior Tibialis and Dorsalis pulse intact bilaterally:  Yes Comments Bunions b/l       Assessment & Plan:    See Problem List for Assessment and Plan of chronic medical problems.

## 2018-06-02 ENCOUNTER — Ambulatory Visit: Payer: PRIVATE HEALTH INSURANCE | Admitting: Internal Medicine

## 2018-06-02 ENCOUNTER — Encounter: Payer: Self-pay | Admitting: Internal Medicine

## 2018-06-02 ENCOUNTER — Other Ambulatory Visit (INDEPENDENT_AMBULATORY_CARE_PROVIDER_SITE_OTHER): Payer: PRIVATE HEALTH INSURANCE

## 2018-06-02 VITALS — BP 142/82 | HR 71 | Temp 98.3°F | Resp 16 | Ht 62.0 in | Wt 157.0 lb

## 2018-06-02 DIAGNOSIS — E039 Hypothyroidism, unspecified: Secondary | ICD-10-CM | POA: Diagnosis not present

## 2018-06-02 DIAGNOSIS — E119 Type 2 diabetes mellitus without complications: Secondary | ICD-10-CM

## 2018-06-02 DIAGNOSIS — E7849 Other hyperlipidemia: Secondary | ICD-10-CM

## 2018-06-02 DIAGNOSIS — I10 Essential (primary) hypertension: Secondary | ICD-10-CM

## 2018-06-02 DIAGNOSIS — Z794 Long term (current) use of insulin: Secondary | ICD-10-CM

## 2018-06-02 LAB — CBC WITH DIFFERENTIAL/PLATELET
Basophils Absolute: 0.1 10*3/uL (ref 0.0–0.1)
Basophils Relative: 1.8 % (ref 0.0–3.0)
EOS ABS: 0.1 10*3/uL (ref 0.0–0.7)
Eosinophils Relative: 2.1 % (ref 0.0–5.0)
HCT: 38.4 % (ref 36.0–46.0)
Hemoglobin: 12.3 g/dL (ref 12.0–15.0)
LYMPHS ABS: 1.4 10*3/uL (ref 0.7–4.0)
Lymphocytes Relative: 35.6 % (ref 12.0–46.0)
MCHC: 32 g/dL (ref 30.0–36.0)
MCV: 74.9 fl — ABNORMAL LOW (ref 78.0–100.0)
Monocytes Absolute: 0.3 10*3/uL (ref 0.1–1.0)
Monocytes Relative: 7.7 % (ref 3.0–12.0)
NEUTROS PCT: 52.8 % (ref 43.0–77.0)
Neutro Abs: 2.1 10*3/uL (ref 1.4–7.7)
Platelets: 270 10*3/uL (ref 150.0–400.0)
RBC: 5.13 Mil/uL — ABNORMAL HIGH (ref 3.87–5.11)
RDW: 14 % (ref 11.5–15.5)
WBC: 4 10*3/uL (ref 4.0–10.5)

## 2018-06-02 LAB — HEPATIC FUNCTION PANEL
ALT: 13 U/L (ref 0–35)
AST: 21 U/L (ref 0–37)
Albumin: 4.6 g/dL (ref 3.5–5.2)
Alkaline Phosphatase: 75 U/L (ref 39–117)
BILIRUBIN TOTAL: 1 mg/dL (ref 0.2–1.2)
Bilirubin, Direct: 0.2 mg/dL (ref 0.0–0.3)
Total Protein: 8.3 g/dL (ref 6.0–8.3)

## 2018-06-02 LAB — COMPREHENSIVE METABOLIC PANEL
ALBUMIN: 4.6 g/dL (ref 3.5–5.2)
ALT: 13 U/L (ref 0–35)
AST: 21 U/L (ref 0–37)
Alkaline Phosphatase: 75 U/L (ref 39–117)
BUN: 15 mg/dL (ref 6–23)
CO2: 28 mEq/L (ref 19–32)
CREATININE: 0.78 mg/dL (ref 0.40–1.20)
Calcium: 9.9 mg/dL (ref 8.4–10.5)
Chloride: 99 mEq/L (ref 96–112)
GFR: 90.78 mL/min (ref >60.00)
Glucose, Bld: 110 mg/dL — ABNORMAL HIGH (ref 70–99)
Potassium: 3.8 mEq/L (ref 3.5–5.1)
Sodium: 138 mEq/L (ref 135–145)
Total Bilirubin: 1 mg/dL (ref 0.2–1.2)
Total Protein: 8.3 g/dL (ref 6.0–8.3)

## 2018-06-02 LAB — LIPID PANEL
Cholesterol: 233 mg/dL — ABNORMAL HIGH (ref 0–200)
HDL: 88.2 mg/dL (ref >39.00)
LDL Cholesterol: 134 mg/dL — ABNORMAL HIGH (ref 0–99)
NonHDL: 144.92
Total CHOL/HDL Ratio: 3
Triglycerides: 56 mg/dL (ref 0.0–149.0)
VLDL: 11.2 mg/dL (ref 0.0–40.0)

## 2018-06-02 LAB — HEMOGLOBIN A1C: Hgb A1c MFr Bld: 6.9 % — ABNORMAL HIGH (ref 4.6–6.5)

## 2018-06-02 LAB — TSH: TSH: 1.25 u[IU]/mL (ref 0.35–4.50)

## 2018-06-02 MED ORDER — CLOBETASOL PROPIONATE 0.05 % EX OINT: 1.0000 "application " | TOPICAL_OINTMENT | Freq: Two times a day (BID) | CUTANEOUS | 2 refills | Status: DC

## 2018-06-02 MED ORDER — POTASSIUM CHLORIDE CRYS ER 20 MEQ PO TBCR: 20.0000 meq | EXTENDED_RELEASE_TABLET | Freq: Every day | ORAL | 1 refills | Status: DC

## 2018-06-02 MED ORDER — TRIAMTERENE-HCTZ 75-50 MG PO TABS: ORAL_TABLET | ORAL | 1 refills | Status: DC

## 2018-06-02 MED ORDER — METFORMIN HCL 1000 MG PO TABS: ORAL_TABLET | ORAL | 1 refills | Status: DC

## 2018-06-02 MED ORDER — SEMAGLUTIDE(0.25 OR 0.5MG/DOS) 2 MG/1.5ML ~~LOC~~ SOPN: 0.5000 mg | PEN_INJECTOR | SUBCUTANEOUS | 3 refills | Status: DC

## 2018-06-02 MED ORDER — LEVOTHYROXINE SODIUM 112 MCG PO TABS: 112.0000 ug | ORAL_TABLET | Freq: Every day | ORAL | 1 refills | Status: DC

## 2018-06-02 NOTE — Assessment & Plan Note (Signed)
Check a1c, CMP Low sugar / carb diet Stressed regular exercise  We will adjust medication if necessary

## 2018-06-02 NOTE — Assessment & Plan Note (Signed)
Clinically euthyroid Check tsh  Titrate med dose if needed  

## 2018-06-02 NOTE — Assessment & Plan Note (Signed)
BP well controlled on average Current regimen effective and well tolerated Continue current medications at current doses CMP, CBC, TSH

## 2018-06-08 ENCOUNTER — Telehealth: Payer: Self-pay | Admitting: Internal Medicine

## 2018-06-08 NOTE — Telephone Encounter (Signed)
Left massage for patient to call back regarding lab results

## 2018-06-08 NOTE — Telephone Encounter (Signed)
Pt called back to get lab results. Please advise.  Copied from [place] [phone]. Topic: Quick Communication - Lab Results (Clinic Use ONLY) >> Jun 07, 2018  4:38 PM Lorrin Jackson, CMA wrote: Called patient to inform them of 06/02/18 lab results. When patient returns call, triage nurse may disclose results.

## 2018-06-12 MED ORDER — ATORVASTATIN CALCIUM 20 MG PO TABS: 20.0000 mg | ORAL_TABLET | Freq: Every day | ORAL | 1 refills | Status: DC

## 2018-06-26 ENCOUNTER — Telehealth: Payer: Self-pay | Admitting: Internal Medicine

## 2018-06-26 NOTE — Telephone Encounter (Signed)
Copied from [place] [id number]. Topic: Quick Communication - See Telephone Encounter >> Jun 26, 2018  4:11 PM Bea Graff, NT wrote: CRM for notification. See Telephone encounter for: 06/26/18. Pt states her new insurance will not cover the ozempic and the medication is $900. She would like to see if something else can be ordered.

## 2018-06-27 MED ORDER — DULAGLUTIDE 0.75 MG/0.5ML ~~LOC~~ SOAJ: 0.7500 mg | SUBCUTANEOUS | 5 refills | Status: DC

## 2018-06-27 NOTE — Telephone Encounter (Signed)
I do not know what is covered / not covered with her insurance.    She can try calling her insurance to see what is covered or I can send another medication and we can continue to go back and forth until we find something. We may not be able to do the injection and may have to switch to a pill.    If nothing is covered we may just have to increase her insulin.

## 2018-06-27 NOTE — Telephone Encounter (Signed)
Pt stated that the trulicity was more expensive than the previous rx. Please advise.

## 2018-06-27 NOTE — Telephone Encounter (Signed)
I sent trulicity to her pharmacy.  This is a similar med that is an injection once a week -- it may or may not be cheaper -- have her call the pharmacy later today to ask if it is cheaper - if so she can use this -- if not she should call and let us know.

## 2018-06-27 NOTE — Telephone Encounter (Signed)
LVM letting pt know to contact insurance company to see what is covered and asked her to call back to let us know.

## 2018-06-28 NOTE — Telephone Encounter (Signed)
LVM letting pt know response below.  

## 2018-06-28 NOTE — Telephone Encounter (Signed)
Yes, just take the metformin and Toujeo at this time.  Be very careful with sugars/carbs and stay active.

## 2018-06-28 NOTE — Telephone Encounter (Signed)
Please advise 

## 2018-06-28 NOTE — Telephone Encounter (Signed)
Pt called and LVM on [facility] voicemail box stating her insurance told her she only has discounts on prescriptions not coverage at this time. She wants to know if continuing the metformin for now will be okay, she is concerned about going through the weekend without medication.

## 2018-07-12 ENCOUNTER — Telehealth: Payer: Self-pay | Admitting: Internal Medicine

## 2018-07-12 NOTE — Telephone Encounter (Signed)
Copied from [place] [phone]. Topic: Quick Communication - See Telephone Encounter >> Jul 12, 2018  1:51 PM Robina Ade, Helene Kelp D wrote: CRM for notification. See Telephone encounter for: 07/12/18. Terri with [employer] [facility] called wanting to talk tp Dr. Quay Burow CMA about a couple of patients medication, her call back number is [phone].

## 2018-07-13 NOTE — Telephone Encounter (Signed)
I called number provided and they are verifying that we received the form for pt's Ozempic.

## 2018-07-18 ENCOUNTER — Telehealth: Payer: Self-pay | Admitting: Internal Medicine

## 2018-07-18 NOTE — Telephone Encounter (Signed)
Pt is not taking ozempic due to cost. She is staying on metformin and toujeo per previous note.

## 2018-07-18 NOTE — Telephone Encounter (Signed)
Patient came by the office to drop off a Patient Assistance Program Application to be completed for Ozempick. Patient would like a call when this is done so that she can pick it up.  Placed in provider's box.

## 2018-07-20 NOTE — Telephone Encounter (Signed)
LVM letting pt know that form is ready for pick up at the front desk.

## 2018-08-31 ENCOUNTER — Telehealth: Payer: Self-pay | Admitting: Internal Medicine

## 2018-08-31 MED ORDER — SEMAGLUTIDE(0.25 OR 0.5MG/DOS) 2 MG/1.5ML ~~LOC~~ SOPN: 0.5000 mg | PEN_INJECTOR | SUBCUTANEOUS | 0 refills | Status: DC

## 2018-08-31 NOTE — Telephone Encounter (Signed)
Pt is aware.  

## 2018-08-31 NOTE — Telephone Encounter (Signed)
Copied from [place] [id number]. Topic: Quick Communication - Rx Refill/Question >> Aug 31, 2018  9:29 AM Scherrie Gerlach wrote: Medication: Semaglutide,0.25 or 0.5MG /DOS, (OZEMPIC, 0.25 OR 0.5 MG/DOSE,) 2 MG/1.5ML SOPN  Pt has been taking this medication, but has run out early. Pt wants to know if she is taking correctly. Pt is took 0.25 for 30 days, then 0.5 for 2 weeks, and now has run out.  Pt states she is missing 2 doses she tinks, so not sure she is taking correctly.  Pt has never heard from the pt assistant program and paid for this out of pocket. Last note states pt is not going to take. Pt states she cannot just stop taking this. Please advise

## 2018-08-31 NOTE — Telephone Encounter (Signed)
FYI

## 2018-08-31 NOTE — Telephone Encounter (Signed)
LVM for pt to call back to discuss further.

## 2018-08-31 NOTE — Telephone Encounter (Signed)
Patient assistance form has been re faxed and mailed to [employer] patient assistance company. Also sent in a new rx for ozempic with updated directions. Pt states that ozempic works better for her.

## 2018-08-31 NOTE — Telephone Encounter (Signed)
The first pen should last 6 weeks because she starts at a lower dose.  Once she is taking 0.5 mg weekly one pen should last one month.

## 2018-09-28 ENCOUNTER — Telehealth: Payer: Self-pay

## 2018-09-28 NOTE — Telephone Encounter (Signed)
Patient assistance forms have been faxed over for ozempic.

## 2018-10-02 NOTE — Progress Notes (Deleted)
Subjective:    Patient ID: Sylvia Conner, female    DOB: 1955/10/08, 63 y.o.   MRN: [id number]  HPI The patient is here for an acute visit.  Bloated:  Diabetes: Elevated sugars in 200s: Her A1c 06/02/2018 was 6.9%.  She is taking her medication daily as prescribed.   Medications and allergies reviewed with patient and updated if appropriate.  Patient Active Problem List   Diagnosis Date Noted  . Lipoma of arm 05/03/2017  . Eczema, feet 12/24/2016  . Left lower quadrant pain 05/04/2016  . Right shoulder pain 10/31/2015  . Adnexal mass 10/31/2015  . HTN (hypertension) 05/24/2011  . DM (diabetes mellitus) ([place]) 05/24/2011  . Hypothyroidism 05/24/2011  . History of hysterectomy 05/24/2011    Current Outpatient Medications on File Prior to Visit  Medication Sig Dispense Refill  . aspirin EC 81 MG tablet Take 81 mg by mouth daily.    Marland Kitchen atorvastatin (LIPITOR) 20 MG tablet Take 1 tablet (20 mg total) by mouth daily. 90 tablet 1  . clobetasol ointment (TEMOVATE) 1.69 % Apply 1 application topically 2 (two) times daily. Do not use for more than two weeks at a time 45 g 2  . ibuprofen (ADVIL,MOTRIN) 600 MG tablet Take 1 tablet (600 mg total) by mouth every 6 (six) hours as needed. 30 tablet 0  . levothyroxine (SYNTHROID, LEVOTHROID) 112 MCG tablet Take 1 tablet (112 mcg total) by mouth daily before breakfast. 90 tablet 1  . metFORMIN (GLUCOPHAGE) 1000 MG tablet TAKE 1 TABLET BY MOUTH TWICE DAILY WITH A MEAL 180 tablet 1  . Multiple Vitamins-Minerals (HAIR SKIN AND NAILS FORMULA PO) Take 1 tablet by mouth daily.    . potassium chloride SA (K-DUR,KLOR-CON) 20 MEQ tablet Take 1 tablet (20 mEq total) by mouth daily. 90 tablet 1  . Semaglutide,0.25 or 0.5MG /DOS, (OZEMPIC, 0.25 OR 0.5 MG/DOSE,) 2 MG/1.5ML SOPN Inject 0.5 mg into the skin once a week. 4 pen 0  . triamterene-hydrochlorothiazide (MAXZIDE) 75-50 MG tablet TAKE 1 TABLET BY MOUTH ONCE DAILY IN THE MORNING 90 tablet 1   No  current facility-administered medications on file prior to visit.     Past Medical History:  Diagnosis Date  . Anemia   . Arthritis   . Diabetes mellitus   . Hypertension   . Thyroid disease   . UTI (lower urinary tract infection)     Past Surgical History:  Procedure Laterality Date  . CESAREAN SECTION    . COLONOSCOPY    . PARTIAL HYSTERECTOMY     1995    Social History   Socioeconomic History  . Marital status: Married    Spouse name: Not on file  . Number of children: Not on file  . Years of education: Not on file  . Highest education level: Not on file  Occupational History  . Not on file  Social Needs  . Financial resource strain: Not on file  . Food insecurity    Worry: Not on file    Inability: Not on file  . Transportation needs    Medical: Not on file    Non-medical: Not on file  Tobacco Use  . Smoking status: Never Smoker  . Smokeless tobacco: Never Used  Substance and Sexual Activity  . Alcohol use: No  . Drug use: No  . Sexual activity: Not on file  Lifestyle  . Physical activity    Days per week: Not on file    Minutes per session: Not on file  .  Stress: Not on file  Relationships  . Social Herbalist on phone: Not on file    Gets together: Not on file    Attends religious service: Not on file    Active member of club or organization: Not on file    Attends meetings of clubs or organizations: Not on file    Relationship status: Not on file  Other Topics Concern  . Not on file  Social History Narrative  . Not on file    Family History  Problem Relation Age of Onset  . Hypertension Mother   . Diabetes Mother   . Diabetes Unknown   . Diabetes Sister   . Diabetes Paternal Grandmother     Review of Systems     Objective:  There were no vitals filed for this visit. BP Readings from Last 3 Encounters:  06/02/18 (!) 142/82  12/14/17 136/78  12/12/17 (!) 125/91   Wt Readings from Last 3 Encounters:  06/02/18 157 lb  (71.2 kg)  12/14/17 158 lb (71.7 kg)  12/12/17 157 lb (71.2 kg)   There is no height or weight on file to calculate BMI.   Physical Exam         Assessment & Plan:    See Problem List for Assessment and Plan of chronic medical problems.

## 2018-10-03 ENCOUNTER — Ambulatory Visit: Payer: PRIVATE HEALTH INSURANCE | Admitting: Internal Medicine

## 2018-10-12 NOTE — Progress Notes (Deleted)
Subjective:    Patient ID: Sylvia Conner, female    DOB: 1955-09-12, 63 y.o.   MRN: [id number]  HPI The patient is here for an acute visit.  Elevated blood sugar:   Bloated:     Medications and allergies reviewed with patient and updated if appropriate.  Patient Active Problem List   Diagnosis Date Noted  . Lipoma of arm 05/03/2017  . Eczema, feet 12/24/2016  . Left lower quadrant pain 05/04/2016  . Right shoulder pain 10/31/2015  . Adnexal mass 10/31/2015  . HTN (hypertension) 05/24/2011  . DM (diabetes mellitus) ([place]) 05/24/2011  . Hypothyroidism 05/24/2011  . History of hysterectomy 05/24/2011    Current Outpatient Medications on File Prior to Visit  Medication Sig Dispense Refill  . aspirin EC 81 MG tablet Take 81 mg by mouth daily.    Marland Kitchen atorvastatin (LIPITOR) 20 MG tablet Take 1 tablet (20 mg total) by mouth daily. 90 tablet 1  . clobetasol ointment (TEMOVATE) 0.30 % Apply 1 application topically 2 (two) times daily. Do not use for more than two weeks at a time 45 g 2  . ibuprofen (ADVIL,MOTRIN) 600 MG tablet Take 1 tablet (600 mg total) by mouth every 6 (six) hours as needed. 30 tablet 0  . levothyroxine (SYNTHROID, LEVOTHROID) 112 MCG tablet Take 1 tablet (112 mcg total) by mouth daily before breakfast. 90 tablet 1  . metFORMIN (GLUCOPHAGE) 1000 MG tablet TAKE 1 TABLET BY MOUTH TWICE DAILY WITH A MEAL 180 tablet 1  . Multiple Vitamins-Minerals (HAIR SKIN AND NAILS FORMULA PO) Take 1 tablet by mouth daily.    . potassium chloride SA (K-DUR,KLOR-CON) 20 MEQ tablet Take 1 tablet (20 mEq total) by mouth daily. 90 tablet 1  . Semaglutide,0.25 or 0.5MG /DOS, (OZEMPIC, 0.25 OR 0.5 MG/DOSE,) 2 MG/1.5ML SOPN Inject 0.5 mg into the skin once a week. 4 pen 0  . triamterene-hydrochlorothiazide (MAXZIDE) 75-50 MG tablet TAKE 1 TABLET BY MOUTH ONCE DAILY IN THE MORNING 90 tablet 1   No current facility-administered medications on file prior to visit.     Past Medical History:   Diagnosis Date  . Anemia   . Arthritis   . Diabetes mellitus   . Hypertension   . Thyroid disease   . UTI (lower urinary tract infection)     Past Surgical History:  Procedure Laterality Date  . CESAREAN SECTION    . COLONOSCOPY    . PARTIAL HYSTERECTOMY     1995    Social History   Socioeconomic History  . Marital status: Married    Spouse name: Not on file  . Number of children: Not on file  . Years of education: Not on file  . Highest education level: Not on file  Occupational History  . Not on file  Social Needs  . Financial resource strain: Not on file  . Food insecurity    Worry: Not on file    Inability: Not on file  . Transportation needs    Medical: Not on file    Non-medical: Not on file  Tobacco Use  . Smoking status: Never Smoker  . Smokeless tobacco: Never Used  Substance and Sexual Activity  . Alcohol use: No  . Drug use: No  . Sexual activity: Not on file  Lifestyle  . Physical activity    Days per week: Not on file    Minutes per session: Not on file  . Stress: Not on file  Relationships  . Social connections  Talks on phone: Not on file    Gets together: Not on file    Attends religious service: Not on file    Active member of club or organization: Not on file    Attends meetings of clubs or organizations: Not on file    Relationship status: Not on file  Other Topics Concern  . Not on file  Social History Narrative  . Not on file    Family History  Problem Relation Age of Onset  . Hypertension Mother   . Diabetes Mother   . Diabetes Unknown   . Diabetes Sister   . Diabetes Paternal Grandmother     Review of Systems     Objective:  There were no vitals filed for this visit. BP Readings from Last 3 Encounters:  06/02/18 (!) 142/82  12/14/17 136/78  12/12/17 (!) 125/91   Wt Readings from Last 3 Encounters:  06/02/18 157 lb (71.2 kg)  12/14/17 158 lb (71.7 kg)  12/12/17 157 lb (71.2 kg)   There is no height or  weight on file to calculate BMI.   Physical Exam    Constitutional: Appears well-developed and well-nourished. No distress.  HENT:  Head: Normocephalic and atraumatic.  Neck: Neck supple. No tracheal deviation present. No thyromegaly present.  No cervical lymphadenopathy Cardiovascular: Normal rate, regular rhythm and normal heart sounds.   No murmur heard. No carotid bruit .  No edema Pulmonary/Chest: Effort normal and breath sounds normal. No respiratory distress. No has no wheezes. No rales.  Skin: Skin is warm and dry. Not diaphoretic.  Psychiatric: Normal mood and affect. Behavior is normal.       Assessment & Plan:    See Problem List for Assessment and Plan of chronic medical problems.

## 2018-10-13 ENCOUNTER — Ambulatory Visit: Payer: PRIVATE HEALTH INSURANCE | Admitting: Internal Medicine

## 2018-10-31 ENCOUNTER — Telehealth: Payer: Self-pay | Admitting: Internal Medicine

## 2018-10-31 NOTE — Telephone Encounter (Signed)
Copied from [place] [id number]. Topic: General - Other >> Oct 31, 2018  4:21 PM Rainey Pines A wrote: Patient would like a callback from Dr. Quay Burow nurse in regards to her patient assistance application for ozempic

## 2018-11-02 NOTE — Telephone Encounter (Signed)
Tried calling pt. LVM letting her know that this form was faxed a month ago. I did refax yesterday. Asked her to call back with any questions or concerns.

## 2018-11-02 NOTE — Telephone Encounter (Signed)
Pt returned cal and stated they received the fax and to be on the look out for the Pt's Ozempic that is to come to the office/

## 2018-11-13 NOTE — Telephone Encounter (Signed)
Patient checking on the status of Semaglutide,0.25 or 0.5MG /DOS, (OZEMPIC, 0.25 OR 0.5 MG/DOSE,) 2 MG/1.5ML SOPN, patient states medication mailed to office, please advise

## 2018-11-14 NOTE — Telephone Encounter (Signed)
Medications found. Called pt to let her know they were ready to be picked up.

## 2018-11-30 DIAGNOSIS — E785 Hyperlipidemia, unspecified: Secondary | ICD-10-CM | POA: Insufficient documentation

## 2018-11-30 NOTE — Progress Notes (Signed)
Subjective:    Patient ID: Sylvia Conner, female    DOB: 1956/03/08, 63 y.o.   MRN: [id number]  HPI The patient is here for follow up.  She is not exercising regularly.    Diabetes: She is taking her medication daily as prescribed. She has been on ozempic x 3 weeks. She is compliant with a diabetic diet.  Her sugar 4 days ago was 126.  She denies numbness/tingling in her feet and foot lesions. She is not up-to-date with an ophthalmology examination.   Hypertension: She is taking her medication daily. She is compliant with a low sodium diet.  She denies chest pain, palpitations, edema, shortness of breath and regular headaches. She does not monitor her blood pressure at home.    Hypothyroidism:  She is taking her medication daily.  She denies any recent changes in energy or weight that are unexplained.   Hyperlipidemia: She is taking her medication daily. She is compliant with a low fat/cholesterol diet. She denies myalgias.     Medications and allergies reviewed with patient and updated if appropriate.  Patient Active Problem List   Diagnosis Date Noted  . Hyperlipidemia 11/30/2018  . Lipoma of arm 05/03/2017  . Eczema, feet 12/24/2016  . Left lower quadrant pain 05/04/2016  . Right shoulder pain 10/31/2015  . Adnexal mass 10/31/2015  . HTN (hypertension) 05/24/2011  . DM (diabetes mellitus) ([place]) 05/24/2011  . Hypothyroidism 05/24/2011  . History of hysterectomy 05/24/2011    Current Outpatient Medications on File Prior to Visit  Medication Sig Dispense Refill  . aspirin EC 81 MG tablet Take 81 mg by mouth daily.    Marland Kitchen ibuprofen (ADVIL,MOTRIN) 600 MG tablet Take 1 tablet (600 mg total) by mouth every 6 (six) hours as needed. 30 tablet 0  . levothyroxine (SYNTHROID, LEVOTHROID) 112 MCG tablet Take 1 tablet (112 mcg total) by mouth daily before breakfast. 90 tablet 1  . Multiple Vitamins-Minerals (HAIR SKIN AND NAILS FORMULA PO) Take 1 tablet by mouth daily.    .  Semaglutide,0.25 or 0.5MG /DOS, (OZEMPIC, 0.25 OR 0.5 MG/DOSE,) 2 MG/1.5ML SOPN Inject 0.5 mg into the skin once a week. 4 pen 0  . triamterene-hydrochlorothiazide (MAXZIDE) 75-50 MG tablet TAKE 1 TABLET BY MOUTH ONCE DAILY IN THE MORNING 90 tablet 1   No current facility-administered medications on file prior to visit.     Past Medical History:  Diagnosis Date  . Anemia   . Arthritis   . Diabetes mellitus   . Hypertension   . Thyroid disease   . UTI (lower urinary tract infection)     Past Surgical History:  Procedure Laterality Date  . CESAREAN SECTION    . COLONOSCOPY    . PARTIAL HYSTERECTOMY     1995    Social History   Socioeconomic History  . Marital status: Married    Spouse name: Not on file  . Number of children: Not on file  . Years of education: Not on file  . Highest education level: Not on file  Occupational History  . Not on file  Social Needs  . Financial resource strain: Not on file  . Food insecurity    Worry: Not on file    Inability: Not on file  . Transportation needs    Medical: Not on file    Non-medical: Not on file  Tobacco Use  . Smoking status: Never Smoker  . Smokeless tobacco: Never Used  Substance and Sexual Activity  . Alcohol use: No  .  Drug use: No  . Sexual activity: Not on file  Lifestyle  . Physical activity    Days per week: Not on file    Minutes per session: Not on file  . Stress: Not on file  Relationships  . Social Herbalist on phone: Not on file    Gets together: Not on file    Attends religious service: Not on file    Active member of club or organization: Not on file    Attends meetings of clubs or organizations: Not on file    Relationship status: Not on file  Other Topics Concern  . Not on file  Social History Narrative  . Not on file    Family History  Problem Relation Age of Onset  . Hypertension Mother   . Diabetes Mother   . Diabetes Unknown   . Diabetes Sister   . Diabetes Paternal  Grandmother     Review of Systems  Constitutional: Negative for chills and fever.  Respiratory: Negative for cough, shortness of breath and wheezing.   Cardiovascular: Negative for chest pain, palpitations and leg swelling.  Neurological: Positive for headaches (occ, in morning). Negative for light-headedness and numbness.       Objective:   Vitals:   12/01/18 0851  BP: 120/80  Pulse: 85  Resp: 16  Temp: 98.8 F (37.1 C)  SpO2: 97%   BP Readings from Last 3 Encounters:  12/01/18 120/80  06/02/18 (!) 142/82  12/14/17 136/78   Wt Readings from Last 3 Encounters:  12/01/18 161 lb (73 kg)  06/02/18 157 lb (71.2 kg)  12/14/17 158 lb (71.7 kg)   Body mass index is 29.45 kg/m.   Physical Exam    Constitutional: Appears well-developed and well-nourished. No distress.  HENT:  Head: Normocephalic and atraumatic.  Neck: Neck supple. No tracheal deviation present. No thyromegaly present.  No cervical lymphadenopathy Cardiovascular: Normal rate, regular rhythm and normal heart sounds.   No murmur heard. No carotid bruit .  No edema Pulmonary/Chest: Effort normal and breath sounds normal. No respiratory distress. No has no wheezes. No rales.  Skin: Skin is warm and dry. Not diaphoretic.  Psychiatric: Normal mood and affect. Behavior is normal.      Assessment & Plan:    See Problem List for Assessment and Plan of chronic medical problems.

## 2018-11-30 NOTE — Patient Instructions (Addendum)
Call and schedule your mammogram -  [facility] 7 a.m.-6:30 p.m., Monday 7 a.m.-5 p.m., Tuesday-Friday Schedule an appointment by calling [phone]   Tests ordered today. Your results will be released to [facility] (or called to you) after review.  If any changes need to be made, you will be notified at that same time.  No immunization administered today.   Medications reviewed and updated.  Changes include :   none  Your prescription(s) have been submitted to your pharmacy. Please take as directed and contact our office if you believe you are having problem(s) with the medication(s).   Please followup in 6 months

## 2018-12-01 ENCOUNTER — Ambulatory Visit (INDEPENDENT_AMBULATORY_CARE_PROVIDER_SITE_OTHER): Payer: PRIVATE HEALTH INSURANCE | Admitting: Internal Medicine

## 2018-12-01 ENCOUNTER — Other Ambulatory Visit: Payer: Self-pay

## 2018-12-01 ENCOUNTER — Telehealth: Payer: Self-pay | Admitting: *Deleted

## 2018-12-01 ENCOUNTER — Other Ambulatory Visit (INDEPENDENT_AMBULATORY_CARE_PROVIDER_SITE_OTHER): Payer: PRIVATE HEALTH INSURANCE

## 2018-12-01 ENCOUNTER — Encounter: Payer: Self-pay | Admitting: Internal Medicine

## 2018-12-01 VITALS — BP 120/80 | HR 85 | Temp 98.8°F | Resp 16 | Ht 62.0 in | Wt 161.0 lb

## 2018-12-01 DIAGNOSIS — Z794 Long term (current) use of insulin: Secondary | ICD-10-CM

## 2018-12-01 DIAGNOSIS — E782 Mixed hyperlipidemia: Secondary | ICD-10-CM

## 2018-12-01 DIAGNOSIS — E119 Type 2 diabetes mellitus without complications: Secondary | ICD-10-CM

## 2018-12-01 DIAGNOSIS — I10 Essential (primary) hypertension: Secondary | ICD-10-CM

## 2018-12-01 DIAGNOSIS — E039 Hypothyroidism, unspecified: Secondary | ICD-10-CM

## 2018-12-01 LAB — COMPREHENSIVE METABOLIC PANEL
ALT: 14 U/L (ref 0–35)
AST: 22 U/L (ref 0–37)
Albumin: 4.2 g/dL (ref 3.5–5.2)
Alkaline Phosphatase: 92 U/L (ref 39–117)
BUN: 17 mg/dL (ref 6–23)
CO2: 30 mEq/L (ref 19–32)
Calcium: 9.5 mg/dL (ref 8.4–10.5)
Chloride: 98 mEq/L (ref 96–112)
Creatinine, Ser: 0.79 mg/dL (ref 0.40–1.20)
GFR: 89.3 mL/min (ref >60.00)
Glucose, Bld: 143 mg/dL — ABNORMAL HIGH (ref 70–99)
Potassium: 3.5 mEq/L (ref 3.5–5.1)
Sodium: 137 mEq/L (ref 135–145)
Total Bilirubin: 0.8 mg/dL (ref 0.2–1.2)
Total Protein: 7.9 g/dL (ref 6.0–8.3)

## 2018-12-01 LAB — LIPID PANEL
Cholesterol: 230 mg/dL — ABNORMAL HIGH (ref 0–200)
HDL: 87.7 mg/dL (ref >39.00)
LDL Cholesterol: 132 mg/dL — ABNORMAL HIGH (ref 0–99)
NonHDL: 142.37
Total CHOL/HDL Ratio: 3
Triglycerides: 51 mg/dL (ref 0.0–149.0)
VLDL: 10.2 mg/dL (ref 0.0–40.0)

## 2018-12-01 LAB — MICROALBUMIN / CREATININE URINE RATIO
Creatinine,U: 57.4 mg/dL
Microalb Creat Ratio: 1.2 mg/g (ref 0.0–30.0)
Microalb, Ur: <0.7 mg/dL (ref 0.0–1.9)

## 2018-12-01 LAB — TSH: TSH: 0.47 u[IU]/mL (ref 0.35–4.50)

## 2018-12-01 LAB — HEMOGLOBIN A1C: Hgb A1c MFr Bld: 8.3 % — ABNORMAL HIGH (ref 4.6–6.5)

## 2018-12-01 MED ORDER — ATORVASTATIN CALCIUM 20 MG PO TABS: 20.0000 mg | ORAL_TABLET | Freq: Every day | ORAL | 1 refills | Status: DC

## 2018-12-01 MED ORDER — POTASSIUM CHLORIDE CRYS ER 20 MEQ PO TBCR: 20.0000 meq | EXTENDED_RELEASE_TABLET | Freq: Every day | ORAL | 1 refills | Status: DC

## 2018-12-01 MED ORDER — METFORMIN HCL 1000 MG PO TABS: ORAL_TABLET | ORAL | 1 refills | Status: DC

## 2018-12-01 MED ORDER — CLOBETASOL PROPIONATE 0.05 % EX OINT: 1.0000 "application " | TOPICAL_OINTMENT | Freq: Two times a day (BID) | CUTANEOUS | 1 refills | Status: DC

## 2018-12-01 NOTE — Assessment & Plan Note (Signed)
Clinically euthyroid Check tsh  Titrate med dose if needed  

## 2018-12-01 NOTE — Telephone Encounter (Signed)
I called pharmacy- they want to confirm that Dr. Quay Burow wants patient to take Kcl 20 meq along with Maxzide. Per Dr. Quay Burow, she does want pt to take both rxs since pt's labs showed Kcl is 3.5 today. Pharmacy informed to fill as prescribed.

## 2018-12-01 NOTE — Assessment & Plan Note (Signed)
BP well controlled Current regimen effective and well tolerated Continue current medications at current doses cmp  

## 2018-12-01 NOTE — Telephone Encounter (Signed)
Copied from [place] [id number]. Topic: General - Inquiry >> Dec 01, 2018 12:12 PM Richardo Priest, NT wrote: Reason for CRM: Pharmacy called in stating they are needing clarification in regards to medication conflict. Please advise. Call back is [phone]. Pharmacist [place]

## 2018-12-01 NOTE — Assessment & Plan Note (Addendum)
Check a1c,urine micro Has been on ozempic x 3 weeks - discussed we can increase the dose in the future if needed/wanted Low sugar / carb diet Stressed regular exercise

## 2018-12-01 NOTE — Assessment & Plan Note (Signed)
Check lipid panel  Continue daily statin Regular exercise and healthy diet encouraged  

## 2018-12-04 ENCOUNTER — Other Ambulatory Visit: Payer: Self-pay | Admitting: Internal Medicine

## 2018-12-04 MED ORDER — OZEMPIC (1 MG/DOSE) 2 MG/1.5ML ~~LOC~~ SOPN: 1.0000 mg | PEN_INJECTOR | SUBCUTANEOUS | 5 refills | Status: DC

## 2018-12-21 ENCOUNTER — Other Ambulatory Visit: Payer: Self-pay | Admitting: Internal Medicine

## 2019-01-30 ENCOUNTER — Other Ambulatory Visit: Payer: Self-pay | Admitting: Internal Medicine

## 2019-02-02 ENCOUNTER — Telehealth: Payer: Self-pay

## 2019-02-02 NOTE — Telephone Encounter (Signed)
LVM letting pt know that ozempic from patient assistance program has been mailed to [place] and ready for pick up. Placed in side B fridge.

## 2019-02-05 ENCOUNTER — Telehealth: Payer: Self-pay | Admitting: Internal Medicine

## 2019-02-05 NOTE — Telephone Encounter (Signed)
Pt called and states that she picked up Ozempic and it is a completely different dose than what she is used to.  Pt wants to make sure this is correct.

## 2019-02-06 NOTE — Telephone Encounter (Signed)
LVM for pt to call back in regards.  

## 2019-04-02 ENCOUNTER — Ambulatory Visit: Payer: PRIVATE HEALTH INSURANCE | Attending: Internal Medicine

## 2019-04-02 DIAGNOSIS — Z20822 Contact with and (suspected) exposure to covid-19: Secondary | ICD-10-CM

## 2019-04-03 LAB — NOVEL CORONAVIRUS, NAA: SARS-CoV-2, NAA: NOT DETECTED

## 2019-04-29 ENCOUNTER — Other Ambulatory Visit: Payer: Self-pay | Admitting: Internal Medicine

## 2019-05-11 ENCOUNTER — Telehealth: Payer: Self-pay | Admitting: Internal Medicine

## 2019-05-11 NOTE — Telephone Encounter (Signed)
Patient states that the assistance program for ozempic has not received reorder.  Would like a call in regard.

## 2019-05-11 NOTE — Telephone Encounter (Signed)
Pt assistance form refaxed. Pt is aware.

## 2019-05-11 NOTE — Telephone Encounter (Signed)
Patient calling to check status. Would like a call back.

## 2019-05-17 ENCOUNTER — Telehealth: Payer: Self-pay

## 2019-05-17 NOTE — Telephone Encounter (Signed)
F/u   Fax # [phone]

## 2019-05-17 NOTE — Telephone Encounter (Signed)
New message    Need prior authorization on  Semaglutide, 1 MG/DOSE, (OZEMPIC, 1 MG/DOSE,) 2 MG/1.5ML SOPN   Rx assistance   Phone # [phone]

## 2019-05-18 NOTE — Telephone Encounter (Signed)
    Patient calling to request status of medication

## 2019-05-18 NOTE — Telephone Encounter (Signed)
Called patient assistance to check on medication. They just needed a form filled out and faxed to manufacturer for ozempic. Form faxed over. PAP will check on it. LVM letting pt know.

## 2019-06-01 ENCOUNTER — Telehealth: Payer: Self-pay

## 2019-06-01 NOTE — Telephone Encounter (Signed)
New message    The patient calling checking Semaglutide, 1 MG/DOSE, (OZEMPIC, 1 MG/DOSE,) 2 MG/1.5ML SOPN to see if it has arrived at the office.

## 2019-06-05 NOTE — Telephone Encounter (Signed)
New message:   Patient is calling to check on the status of her medication coming to our office. Please advise.

## 2019-06-05 NOTE — Telephone Encounter (Signed)
Left detailed message letting pt know that patient assistant program did not ship medication yet and a new form has to be filled out due to them having the old address. LVM to call back in regards .

## 2019-06-06 NOTE — Telephone Encounter (Signed)
Faxed over updated [employer] patient assistance form with updated address.

## 2019-06-07 NOTE — Progress Notes (Signed)
Subjective:    Patient ID: Sylvia Conner, female    DOB: February 20, 1956, 64 y.o.   MRN: [id number]  HPI The patient is here for follow up of their chronic medical problems, including diabetes, hypertension, hyperlipidemia, hypothyroidism.   She is taking all of her medications as prescribed.    She is not exercising regularly.     Left eye water, itchy it started three days ago.  It started about three days ago.  No right eye symptoms.      Medications and allergies reviewed with patient and updated if appropriate.  Patient Active Problem List   Diagnosis Date Noted  . Hyperlipidemia 11/30/2018  . Lipoma of arm 05/03/2017  . Eczema, feet 12/24/2016  . Left lower quadrant pain 05/04/2016  . Right shoulder pain 10/31/2015  . Adnexal mass 10/31/2015  . HTN (hypertension) 05/24/2011  . DM (diabetes mellitus) ([place]) 05/24/2011  . Hypothyroidism 05/24/2011  . History of hysterectomy 05/24/2011    Current Outpatient Medications on File Prior to Visit  Medication Sig Dispense Refill  . aspirin EC 81 MG tablet Take 81 mg by mouth daily.    . clobetasol ointment (TEMOVATE) AB-123456789 % Apply 1 application topically 2 (two) times daily. Do not use for more than two weeks at a time 45 g 1  . ibuprofen (ADVIL,MOTRIN) 600 MG tablet Take 1 tablet (600 mg total) by mouth every 6 (six) hours as needed. 30 tablet 0  . Multiple Vitamins-Minerals (HAIR SKIN AND NAILS FORMULA PO) Take 1 tablet by mouth daily.    . potassium chloride SA (K-DUR) 20 MEQ tablet Take 1 tablet (20 mEq total) by mouth daily. 90 tablet 1  . Semaglutide, 1 MG/DOSE, (OZEMPIC, 1 MG/DOSE,) 2 MG/1.5ML SOPN Inject 1 mg into the skin once a week. 1 pen 5   No current facility-administered medications on file prior to visit.    Past Medical History:  Diagnosis Date  . Anemia   . Arthritis   . Diabetes mellitus   . Hypertension   . Thyroid disease   . UTI (lower urinary tract infection)     Past Surgical History:   Procedure Laterality Date  . CESAREAN SECTION    . COLONOSCOPY    . PARTIAL HYSTERECTOMY     1995    Social History   Socioeconomic History  . Marital status: Married    Spouse name: Not on file  . Number of children: Not on file  . Years of education: Not on file  . Highest education level: Not on file  Occupational History  . Not on file  Tobacco Use  . Smoking status: Never Smoker  . Smokeless tobacco: Never Used  Substance and Sexual Activity  . Alcohol use: No  . Drug use: No  . Sexual activity: Not on file  Other Topics Concern  . Not on file  Social History Narrative  . Not on file   Social Determinants of Health   Financial Resource Strain:   . Difficulty of Paying Living Expenses: Not on file  Food Insecurity:   . Worried About Charity fundraiser in the Last Year: Not on file  . Ran Out of Food in the Last Year: Not on file  Transportation Needs:   . Lack of Transportation (Medical): Not on file  . Lack of Transportation (Non-Medical): Not on file  Physical Activity:   . Days of Exercise per Week: Not on file  . Minutes of Exercise per Session: Not on  file  Stress:   . Feeling of Stress : Not on file  Social Connections:   . Frequency of Communication with Friends and Family: Not on file  . Frequency of Social Gatherings with Friends and Family: Not on file  . Attends Religious Services: Not on file  . Active Member of Clubs or Organizations: Not on file  . Attends Archivist Meetings: Not on file  . Marital Status: Not on file    Family History  Problem Relation Age of Onset  . Hypertension Mother   . Diabetes Mother   . Diabetes Unknown   . Diabetes Sister   . Diabetes Paternal Grandmother     Review of Systems  Constitutional: Negative for chills and fever.  Respiratory: Negative for cough, shortness of breath and wheezing.   Cardiovascular: Negative for chest pain, palpitations and leg swelling.  Neurological: Negative for  light-headedness (occ, if bend down and comes up fast - only occ), numbness and headaches.       Objective:   Vitals:   06/08/19 0841  BP: 130/82  Pulse: 82  Resp: 16  Temp: 98.6 F (37 C)  SpO2: 98%   BP Readings from Last 3 Encounters:  06/08/19 130/82  12/01/18 120/80  06/02/18 (!) 142/82   Wt Readings from Last 3 Encounters:  06/08/19 159 lb 3.2 oz (72.2 kg)  12/01/18 161 lb (73 kg)  06/02/18 157 lb (71.2 kg)   Body mass index is 29.12 kg/m.   Physical Exam    Constitutional: Appears well-developed and well-nourished. No distress.  HENT: Eyes: No conjunctival erythema bilaterally.  No discharge bilaterally Head: Normocephalic and atraumatic.  Neck: Neck supple. No tracheal deviation present. No thyromegaly present.  No cervical lymphadenopathy Cardiovascular: Normal rate, regular rhythm and normal heart sounds.   No murmur heard. No carotid bruit .  No edema Pulmonary/Chest: Effort normal and breath sounds normal. No respiratory distress. No has no wheezes. No rales.  Skin: Skin is warm and dry. Not diaphoretic.  Psychiatric: Normal mood and affect. Behavior is normal.      Assessment & Plan:    See Problem List for Assessment and Plan of chronic medical problems.    This visit occurred during the SARS-CoV-2 public health emergency.  Safety protocols were in place, including screening questions prior to the visit, additional usage of staff PPE, and extensive cleaning of exam room while observing appropriate contact time as indicated for disinfecting solutions.

## 2019-06-07 NOTE — Patient Instructions (Addendum)
  Blood work was ordered.   ° ° °Medications reviewed and updated.  Changes include :   none ° ° ° °Please followup in 6 months ° ° °

## 2019-06-08 ENCOUNTER — Encounter: Payer: Self-pay | Admitting: Internal Medicine

## 2019-06-08 ENCOUNTER — Other Ambulatory Visit: Payer: Self-pay

## 2019-06-08 ENCOUNTER — Ambulatory Visit: Payer: Self-pay | Admitting: Internal Medicine

## 2019-06-08 VITALS — BP 130/82 | HR 82 | Temp 98.6°F | Resp 16 | Ht 62.0 in | Wt 159.2 lb

## 2019-06-08 DIAGNOSIS — E782 Mixed hyperlipidemia: Secondary | ICD-10-CM

## 2019-06-08 DIAGNOSIS — I10 Essential (primary) hypertension: Secondary | ICD-10-CM

## 2019-06-08 DIAGNOSIS — Z794 Long term (current) use of insulin: Secondary | ICD-10-CM

## 2019-06-08 DIAGNOSIS — E119 Type 2 diabetes mellitus without complications: Secondary | ICD-10-CM

## 2019-06-08 DIAGNOSIS — E039 Hypothyroidism, unspecified: Secondary | ICD-10-CM

## 2019-06-08 LAB — HEMOGLOBIN A1C: Hgb A1c MFr Bld: 7.7 % — ABNORMAL HIGH (ref 4.6–6.5)

## 2019-06-08 LAB — BASIC METABOLIC PANEL
BUN: 16 mg/dL (ref 6–23)
CO2: 30 mEq/L (ref 19–32)
Calcium: 9.8 mg/dL (ref 8.4–10.5)
Chloride: 98 mEq/L (ref 96–112)
Creatinine, Ser: 0.77 mg/dL (ref 0.40–1.20)
GFR: 91.83 mL/min (ref >60.00)
Glucose, Bld: 165 mg/dL — ABNORMAL HIGH (ref 70–99)
Potassium: 3.4 mEq/L — ABNORMAL LOW (ref 3.5–5.1)
Sodium: 135 mEq/L (ref 135–145)

## 2019-06-08 MED ORDER — LEVOTHYROXINE SODIUM 112 MCG PO TABS: ORAL_TABLET | ORAL | 1 refills | Status: DC

## 2019-06-08 MED ORDER — TRIAMTERENE-HCTZ 75-50 MG PO TABS: ORAL_TABLET | ORAL | 1 refills | Status: DC

## 2019-06-08 MED ORDER — METFORMIN HCL 1000 MG PO TABS: ORAL_TABLET | ORAL | 1 refills | Status: DC

## 2019-06-08 MED ORDER — ATORVASTATIN CALCIUM 20 MG PO TABS: 20.0000 mg | ORAL_TABLET | Freq: Every day | ORAL | 1 refills | Status: DC

## 2019-06-08 NOTE — Assessment & Plan Note (Signed)
Chronic BP well controlled Current regimen effective and well tolerated Continue current medications at current doses cmp  

## 2019-06-08 NOTE — Assessment & Plan Note (Signed)
Chronic Check a1c Low sugar / carb diet Stressed regular exercise Continue Metformin, Ozempic

## 2019-06-08 NOTE — Assessment & Plan Note (Signed)
Chronic  Clinically euthyroid Check tsh  Titrate med dose if needed  

## 2019-06-08 NOTE — Assessment & Plan Note (Signed)
Chronic Lipids have been well controlled Continue daily statin Regular exercise and healthy diet encouraged  

## 2019-06-10 ENCOUNTER — Other Ambulatory Visit: Payer: Self-pay | Admitting: Internal Medicine

## 2019-06-10 ENCOUNTER — Encounter: Payer: Self-pay | Admitting: Internal Medicine

## 2019-06-10 MED ORDER — POTASSIUM CHLORIDE CRYS ER 20 MEQ PO TBCR: 40.0000 meq | EXTENDED_RELEASE_TABLET | Freq: Every day | ORAL | 1 refills | Status: DC

## 2019-06-21 ENCOUNTER — Other Ambulatory Visit: Payer: Self-pay

## 2019-06-21 ENCOUNTER — Encounter: Payer: Self-pay | Admitting: Internal Medicine

## 2019-06-21 ENCOUNTER — Ambulatory Visit (INDEPENDENT_AMBULATORY_CARE_PROVIDER_SITE_OTHER): Payer: Self-pay | Admitting: Internal Medicine

## 2019-06-21 DIAGNOSIS — H15102 Unspecified episcleritis, left eye: Secondary | ICD-10-CM

## 2019-06-21 MED ORDER — PREDNISOLONE ACETATE 1 % OP SUSP: 1.0000 [drp] | Freq: Four times a day (QID) | OPHTHALMIC | 0 refills | Status: DC

## 2019-06-21 NOTE — Progress Notes (Signed)
Subjective:    Patient ID: Sylvia Conner, female    DOB: [birth year], 64 y.o.   MRN: [id number]  HPI The patient is here for an acute visit.  For the past 2 weeks she has had redness in her left thigh.  There has been no improvement in the past 2 weeks.  It feels like something is in it.  Sometimes her vision is blurry.  He does water intermittently.  She has some discomfort with eye movements.  She has mild photophobia.  She denies any fever, chills or cold symptoms.  She denies being around anyone with pinkeye.  She has not had pinkeye before in the past.    Medications and allergies reviewed with patient and updated if appropriate.  Patient Active Problem List   Diagnosis Date Noted  . Hyperlipidemia 11/30/2018  . Lipoma of arm 05/03/2017  . Eczema, feet 12/24/2016  . Left lower quadrant pain 05/04/2016  . Right shoulder pain 10/31/2015  . Adnexal mass 10/31/2015  . HTN (hypertension) 05/24/2011  . DM (diabetes mellitus) ([place]) 05/24/2011  . Hypothyroidism 05/24/2011  . History of hysterectomy 05/24/2011    Current Outpatient Medications on File Prior to Visit  Medication Sig Dispense Refill  . aspirin EC 81 MG tablet Take 81 mg by mouth daily.    Marland Kitchen atorvastatin (LIPITOR) 20 MG tablet Take 1 tablet (20 mg total) by mouth daily. 90 tablet 1  . clobetasol ointment (TEMOVATE) AB-123456789 % Apply 1 application topically 2 (two) times daily. Do not use for more than two weeks at a time 45 g 1  . ibuprofen (ADVIL,MOTRIN) 600 MG tablet Take 1 tablet (600 mg total) by mouth every 6 (six) hours as needed. 30 tablet 0  . levothyroxine (SYNTHROID) 112 MCG tablet TAKE 1 TABLET BY MOUTH ONCE DAILY BEFORE BREAKFAST 90 tablet 1  . metFORMIN (GLUCOPHAGE) 1000 MG tablet TAKE 1 TABLET BY MOUTH TWICE DAILY WITH A MEAL 180 tablet 1  . Multiple Vitamins-Minerals (HAIR SKIN AND NAILS FORMULA PO) Take 1 tablet by mouth daily.    . potassium chloride SA (KLOR-CON) 20 MEQ tablet Take 2 tablets (40 mEq  total) by mouth daily. 180 tablet 1  . Semaglutide, 1 MG/DOSE, (OZEMPIC, 1 MG/DOSE,) 2 MG/1.5ML SOPN Inject 1 mg into the skin once a week. 1 pen 5  . triamterene-hydrochlorothiazide (MAXZIDE) 75-50 MG tablet TAKE 1 TABLET BY MOUTH ONCE DAILY IN THE MORNING 90 tablet 1   No current facility-administered medications on file prior to visit.    Past Medical History:  Diagnosis Date  . Anemia   . Arthritis   . Diabetes mellitus   . Hypertension   . Thyroid disease   . UTI (lower urinary tract infection)     Past Surgical History:  Procedure Laterality Date  . CESAREAN SECTION    . COLONOSCOPY    . PARTIAL HYSTERECTOMY     1995    Social History   Socioeconomic History  . Marital status: Married    Spouse name: Not on file  . Number of children: Not on file  . Years of education: Not on file  . Highest education level: Not on file  Occupational History  . Not on file  Tobacco Use  . Smoking status: Never Smoker  . Smokeless tobacco: Never Used  Substance and Sexual Activity  . Alcohol use: No  . Drug use: No  . Sexual activity: Not on file  Other Topics Concern  . Not on file  Social History Narrative  . Not on file   Social Determinants of Health   Financial Resource Strain:   . Difficulty of Paying Living Expenses:   Food Insecurity:   . Worried About Charity fundraiser in the Last Year:   . Arboriculturist in the Last Year:   Transportation Needs:   . Film/video editor (Medical):   Marland Kitchen Lack of Transportation (Non-Medical):   Physical Activity:   . Days of Exercise per Week:   . Minutes of Exercise per Session:   Stress:   . Feeling of Stress :   Social Connections:   . Frequency of Communication with Friends and Family:   . Frequency of Social Gatherings with Friends and Family:   . Attends Religious Services:   . Active Member of Clubs or Organizations:   . Attends Archivist Meetings:   Marland Kitchen Marital Status:     Family History  Problem  Relation Age of Onset  . Hypertension Mother   . Diabetes Mother   . Diabetes Unknown   . Diabetes Sister   . Diabetes Paternal Grandmother     Review of Systems  Constitutional: Negative for chills and fever.  HENT: Negative for congestion and sore throat.   Eyes: Positive for photophobia, pain, discharge (watery, no pus) and redness. Negative for visual disturbance.  Neurological: Positive for light-headedness (occ). Negative for headaches.       Objective:   Vitals:   06/21/19 1033  BP: 134/80  Pulse: 78  Resp: 16  Temp: 98.6 F (37 C)  SpO2: 97%   BP Readings from Last 3 Encounters:  06/21/19 134/80  06/08/19 130/82  12/01/18 120/80   Wt Readings from Last 3 Encounters:  06/21/19 157 lb (71.2 kg)  06/08/19 159 lb 3.2 oz (72.2 kg)  12/01/18 161 lb (73 kg)   Body mass index is 28.72 kg/m.   Physical Exam Constitutional:      General: She is not in acute distress.    Appearance: Normal appearance. She is not ill-appearing.  HENT:     Head: Normocephalic and atraumatic.  Eyes:     General: No scleral icterus.       Right eye: No discharge.        Left eye: No discharge.     Extraocular Movements: Extraocular movements intact.     Pupils: Pupils are equal, round, and reactive to light.     Comments: Right conjunctivae normal.  Left conjunctivae with patch of vascular erythema below iris.  No foreign body.  No stye.  No swollen eyelids.  Skin:    General: Skin is warm and dry.     Findings: No erythema or lesion.  Neurological:     Mental Status: She is alert.            Assessment & Plan:    See Problem List for Assessment and Plan of chronic medical problems.    This visit occurred during the SARS-CoV-2 public health emergency.  Safety protocols were in place, including screening questions prior to the visit, additional usage of staff PPE, and extensive cleaning of exam room while observing appropriate contact time as indicated for disinfecting  solutions.

## 2019-06-21 NOTE — Assessment & Plan Note (Signed)
Acute Findings concerning for episcleritis She does not have an ophthalmologist, but does see an optometrist.  Discussed that if the symptoms do not improve or worsen she needs to see an ophthalmologist. Will prescribe prednisolone for 5 days only.  Advised she can also use artificial tears and take Advil

## 2019-06-21 NOTE — Patient Instructions (Signed)
You may have episcleritis.  This is inflammation in the eye.   Use the eye drops for 5 days.  You can take advil.    If there is no improvement you should see an ophthalmologist for further evaluation.      Scleritis and Episcleritis Scleritis and episcleritis are redness and inflammation of the white part of the eye (sclera). Both of these conditions may affect one or both eyes.  Episcleritis involves only the surface of the sclera. Episcleritis is a mild condition that does not affect your vision. It usually clears up in a few days without treatment.  Scleritis involves the body of the sclera. It is a serious condition that may cause severe pain and some vision loss. Scleritis often needs to be treated with strong medicines. These two conditions may occur along with other diseases that cause inflammation, including diseases in which your body's defense system (immune system) mistakenly attacks normal body tissues (autoimmune diseases). Rheumatoid arthritis is one common autoimmune disease that may cause episcleritis or scleritis. Other diseases that may cause these conditions include Crohn's disease, lupus, and inflammatory bowel disease. What are the causes? Scleritis is usually caused by:  An autoimmune disease.  Infection. Episcleritis may be caused by:  Eye infections.  Eye irritations. In some cases, the cause of these conditions is not known. What increases the risk? You are more likely to develop these conditions if you:  Are a woman.  Are 70-99 years old.  Have an autoimmune disease, especially rheumatoid arthritis. What are the signs or symptoms? Symptoms of episcleritis include:  Redness of the sclera. This may go away after several days.  Mild pain. Symptoms of scleritis include:  Redness of the sclera.  Moderate to severe pain.  Pain that gets much worse with light exposure (photophobia).  Tearing.  Soreness when touched (tenderness).  Vision  problems. This can range from mild blurred vision to severe vision loss. How is this diagnosed? These conditions may be diagnosed based on your symptoms, your medical history, and a physical exam. You may need to see a health care provider who specializes in diseases and conditions of the eye (ophthalmologist). An ophthalmologist may:  Put drops in your eye.  Examine your eye with a certain type of light (slit lamp).  Order blood tests or refer you to a specialist to check for autoimmune disease. How is this treated? Episcleritis often gets better without treatment after several days. If treatment is needed, it may include:  Eye drops.  Cold compresses.  NSAIDs, such as ibuprofen, to reduce discomfort and swelling. Scleritis is a serious condition that needs to be treated. Treatment may include:  Eye drops.  NSAIDs.  Prescription pain medicines.  Steroid medicines.  Medicines that suppress the immune system.  Treatment of an underlying autoimmune disease.  Eye surgery. This is rare. Follow these instructions at home: Medicines  Use eye drops only as told by your health care provider.  Take over-the-counter and prescription medicines only as told by your health care provider. General instructions  If you usually wear contact lenses, do not wear them until your health care provider says that you can.  If directed, use cold compresses over your eye or eyes to help relieve pain.  Return to your normal activities as told by your health care provider. Ask your health care provider what activities are safe for you.  Keep all follow-up visits as told by your health care provider. This is important. Contact a health care provider  if:  Your symptoms have not gotten better in the expected amount of time. This can take days to weeks.  You develop new symptoms.  You develop worsening pain, photophobia, or blurred vision.  Your symptoms come back after treatment. Get help  right away if you have:  Severe pain or severe photophobia.  Sudden vision loss. Summary  Scleritis and episcleritis are redness and inflammation of the white part of the eye (sclera).  Episcleritis involves the surface of the sclera while scleritis involves the entire sclera.  These two conditions may occur along with other diseases that cause inflammation.  The conditions are treated with eye drops, cold compresses, and oral medicines to relieve the inflammation. This information is not intended to replace advice given to you by your health care provider. Make sure you discuss any questions you have with your health care provider. Document Revised: 07/14/2018 Document Reviewed: 11/07/2017 Elsevier Patient Education  [employer].

## 2019-06-25 ENCOUNTER — Telehealth: Payer: Self-pay | Admitting: Internal Medicine

## 2019-06-25 NOTE — Telephone Encounter (Signed)
    Patient wants to know if she should get the shingles vaccine and the cost / self pay

## 2019-06-27 NOTE — Telephone Encounter (Signed)
LVM letting pt know that cost of vaccine could vary between 150-200 dollars.

## 2019-08-08 ENCOUNTER — Encounter: Payer: Self-pay | Admitting: Internal Medicine

## 2019-08-08 ENCOUNTER — Ambulatory Visit (INDEPENDENT_AMBULATORY_CARE_PROVIDER_SITE_OTHER): Payer: Self-pay | Admitting: Internal Medicine

## 2019-08-08 ENCOUNTER — Other Ambulatory Visit: Payer: Self-pay

## 2019-08-08 DIAGNOSIS — L299 Pruritus, unspecified: Secondary | ICD-10-CM | POA: Insufficient documentation

## 2019-08-08 MED ORDER — HYDROXYZINE HCL 25 MG PO TABS: 25.0000 mg | ORAL_TABLET | Freq: Three times a day (TID) | ORAL | 2 refills | Status: DC | PRN

## 2019-08-08 NOTE — Progress Notes (Signed)
Subjective:    Patient ID: Sylvia Conner, female    DOB: [birth year], 64 y.o.   MRN: [id number]  HPI The patient is here for an acute visit.   Itching all over:  She had shingles that started march 2nd.  It was on the left side of her head and under arm on her torso.  She did not see anyone for this and just treated at home.  She has itching all over her body, not just where she had the shingles.  This started about one week after the shingles.  She denies rash.  She feels itching on her arms, her legs and chest.  She has tried zyrtec, claritin D.  It works only a [place] and does not last.     She denies new products.  She denies new medications.    She has no idea what the potential cause could be, but is frustrated by the itching and the medications she has tried not working.  Medications and allergies reviewed with patient and updated if appropriate.  Patient Active Problem List   Diagnosis Date Noted  . Pruritus 08/08/2019  . Episcleritis of left eye 06/21/2019  . Hyperlipidemia 11/30/2018  . Lipoma of arm 05/03/2017  . Eczema, feet 12/24/2016  . Left lower quadrant pain 05/04/2016  . Right shoulder pain 10/31/2015  . Adnexal mass 10/31/2015  . HTN (hypertension) 05/24/2011  . DM (diabetes mellitus) ([place]) 05/24/2011  . Hypothyroidism 05/24/2011  . History of hysterectomy 05/24/2011    Current Outpatient Medications on File Prior to Visit  Medication Sig Dispense Refill  . aspirin EC 81 MG tablet Take 81 mg by mouth daily.    Marland Kitchen atorvastatin (LIPITOR) 20 MG tablet Take 1 tablet (20 mg total) by mouth daily. 90 tablet 1  . clobetasol ointment (TEMOVATE) AB-123456789 % Apply 1 application topically 2 (two) times daily. Do not use for more than two weeks at a time 45 g 1  . ibuprofen (ADVIL,MOTRIN) 600 MG tablet Take 1 tablet (600 mg total) by mouth every 6 (six) hours as needed. 30 tablet 0  . levothyroxine (SYNTHROID) 112 MCG tablet TAKE 1 TABLET BY MOUTH ONCE DAILY BEFORE  BREAKFAST 90 tablet 1  . metFORMIN (GLUCOPHAGE) 1000 MG tablet TAKE 1 TABLET BY MOUTH TWICE DAILY WITH A MEAL 180 tablet 1  . Multiple Vitamins-Minerals (HAIR SKIN AND NAILS FORMULA PO) Take 1 tablet by mouth daily.    . potassium chloride SA (KLOR-CON) 20 MEQ tablet Take 2 tablets (40 mEq total) by mouth daily. 180 tablet 1  . Semaglutide, 1 MG/DOSE, (OZEMPIC, 1 MG/DOSE,) 2 MG/1.5ML SOPN Inject 1 mg into the skin once a week. 1 pen 5  . triamterene-hydrochlorothiazide (MAXZIDE) 75-50 MG tablet TAKE 1 TABLET BY MOUTH ONCE DAILY IN THE MORNING 90 tablet 1   No current facility-administered medications on file prior to visit.    Past Medical History:  Diagnosis Date  . Anemia   . Arthritis   . Diabetes mellitus   . Hypertension   . Thyroid disease   . UTI (lower urinary tract infection)     Past Surgical History:  Procedure Laterality Date  . CESAREAN SECTION    . COLONOSCOPY    . PARTIAL HYSTERECTOMY     1995    Social History   Socioeconomic History  . Marital status: Married    Spouse name: Not on file  . Number of children: Not on file  . Years of education: Not on file  .  Highest education level: Not on file  Occupational History  . Not on file  Tobacco Use  . Smoking status: Never Smoker  . Smokeless tobacco: Never Used  Substance and Sexual Activity  . Alcohol use: No  . Drug use: No  . Sexual activity: Not on file  Other Topics Concern  . Not on file  Social History Narrative  . Not on file   Social Determinants of Health   Financial Resource Strain:   . Difficulty of Paying Living Expenses:   Food Insecurity:   . Worried About Charity fundraiser in the Last Year:   . Arboriculturist in the Last Year:   Transportation Needs:   . Film/video editor (Medical):   Marland Kitchen Lack of Transportation (Non-Medical):   Physical Activity:   . Days of Exercise per Week:   . Minutes of Exercise per Session:   Stress:   . Feeling of Stress :   Social Connections:    . Frequency of Communication with Friends and Family:   . Frequency of Social Gatherings with Friends and Family:   . Attends Religious Services:   . Active Member of Clubs or Organizations:   . Attends Archivist Meetings:   Marland Kitchen Marital Status:     Family History  Problem Relation Age of Onset  . Hypertension Mother   . Diabetes Mother   . Diabetes Unknown   . Diabetes Sister   . Diabetes Paternal Grandmother     Review of Systems  Constitutional: Negative for fever.  HENT: Negative for congestion, postnasal drip, rhinorrhea and sinus pressure.   Respiratory: Negative for cough and shortness of breath.   Skin: Negative for color change, rash and wound.       Objective:   Vitals:   08/08/19 0943  BP: (!) 148/90  Pulse: 88  Resp: 16  Temp: 99.2 F (37.3 C)  SpO2: 98%   BP Readings from Last 3 Encounters:  08/08/19 (!) 148/90  06/21/19 134/80  06/08/19 130/82   Wt Readings from Last 3 Encounters:  08/08/19 162 lb (73.5 kg)  06/21/19 157 lb (71.2 kg)  06/08/19 159 lb 3.2 oz (72.2 kg)   Body mass index is 29.63 kg/m.   Physical Exam Constitutional:      General: She is not in acute distress.    Appearance: Normal appearance. She is not ill-appearing.  HENT:     Head: Normocephalic and atraumatic.  Skin:    General: Skin is warm and dry.     Findings: No erythema, lesion or rash.  Neurological:     Mental Status: She is alert.            Assessment & Plan:    See Problem List for Assessment and Plan of chronic medical problems.    This visit occurred during the SARS-CoV-2 public health emergency.  Safety protocols were in place, including screening questions prior to the visit, additional usage of staff PPE, and extensive cleaning of exam room while observing appropriate contact time as indicated for disinfecting solutions.

## 2019-08-08 NOTE — Assessment & Plan Note (Signed)
Acute Having itching for about 2 months now-no obvious cause.  No change in products, no exposures, no change in medication She did have shingles right before this started, but the itching is throughout her body and not where the shingles was so I do not think this is related Over-the-counter oral antihistamines helping, but does not last Discussed it may be difficult to find out what the cause is We will try hydroxyzine to treat the itching-25 mg 3 times daily as needed Call or return if no improvement

## 2019-08-08 NOTE — Patient Instructions (Signed)
    Medications reviewed and updated.  Changes include :   Hydroxyzine 25 mg three times a day as needed for itching.  Your prescription(s) have been submitted to your pharmacy. Please take as directed and contact our office if you believe you are having problem(s) with the medication(s).   Please call if there is no improvement in your symptoms.

## 2019-08-31 ENCOUNTER — Telehealth: Payer: Self-pay

## 2019-08-31 NOTE — Telephone Encounter (Signed)
Spoke with pt in regards  

## 2019-08-31 NOTE — Telephone Encounter (Signed)
LVM to call back.

## 2019-08-31 NOTE — Telephone Encounter (Signed)
New message    The patient drop paperwork off on Monday, May 24th. Was told they would put the paperwork in her box.    The paperwork is for medication assistance to get help get Semaglutide, 1 MG/DOSE, (OZEMPIC, 1 MG/DOSE,) 2 MG/1.5ML SOPN

## 2019-08-31 NOTE — Telephone Encounter (Signed)
  Follow up message  Please return call to patient 

## 2019-10-11 ENCOUNTER — Telehealth: Payer: Self-pay

## 2019-10-11 NOTE — Telephone Encounter (Signed)
Called and left message today to let her know that her patience assistance for Ozempic had arrived today and is ready for pick up.

## 2019-10-15 ENCOUNTER — Telehealth: Payer: Self-pay

## 2019-10-15 NOTE — Telephone Encounter (Signed)
New message    The patient is out of town will return on  7.19.21 will come by the office to pick her sample medication ozempic

## 2019-12-06 ENCOUNTER — Telehealth: Payer: Self-pay | Admitting: Internal Medicine

## 2019-12-06 NOTE — Telephone Encounter (Signed)
    Patient calling to check status of assistance forms for medication  dropped off to the front desk.

## 2019-12-06 NOTE — Telephone Encounter (Signed)
I am not aware of a form. Is this something that was sent to the assistance?

## 2019-12-06 NOTE — Telephone Encounter (Signed)
MD has completed her part. Called pt there was no answer LMOM application is ready for pick-up.Marland KitchenJohny Chess

## 2019-12-20 NOTE — Progress Notes (Deleted)
Virtual Visit via Video Note  I connected with Sylvia Conner on 12/20/19 at  2:30 PM EDT by a video enabled telemedicine application and verified that I am speaking with the correct person using two identifiers.   I discussed the limitations of evaluation and management by telemedicine and the availability of in person appointments. The patient expressed understanding and agreed to proceed.  Present for the visit:  Myself, Dr Billey Gosling, Kyndahl Catherman.  The patient is currently at home and I am in the office.    No referring provider.    History of Present Illness: This is an acute visit for diarrhea.      ROS    Social History   Socioeconomic History  . Marital status: Married    Spouse name: Not on file  . Number of children: Not on file  . Years of education: Not on file  . Highest education level: Not on file  Occupational History  . Not on file  Tobacco Use  . Smoking status: Never Smoker  . Smokeless tobacco: Never Used  Substance and Sexual Activity  . Alcohol use: No  . Drug use: No  . Sexual activity: Not on file  Other Topics Concern  . Not on file  Social History Narrative  . Not on file   Social Determinants of Health   Financial Resource Strain:   . Difficulty of Paying Living Expenses: Not on file  Food Insecurity:   . Worried About Charity fundraiser in the Last Year: Not on file  . Ran Out of Food in the Last Year: Not on file  Transportation Needs:   . Lack of Transportation (Medical): Not on file  . Lack of Transportation (Non-Medical): Not on file  Physical Activity:   . Days of Exercise per Week: Not on file  . Minutes of Exercise per Session: Not on file  Stress:   . Feeling of Stress : Not on file  Social Connections:   . Frequency of Communication with Friends and Family: Not on file  . Frequency of Social Gatherings with Friends and Family: Not on file  . Attends Religious Services: Not on file  . Active Member of Clubs or  Organizations: Not on file  . Attends Archivist Meetings: Not on file  . Marital Status: Not on file     Observations/Objective: Appears well in NAD   Assessment and Plan:  See Problem List for Assessment and Plan of chronic medical problems.   Follow Up Instructions:    I discussed the assessment and treatment plan with the patient. The patient was provided an opportunity to ask questions and all were answered. The patient agreed with the plan and demonstrated an understanding of the instructions.   The patient was advised to call back or seek an in-person evaluation if the symptoms worsen or if the condition fails to improve as anticipated.    Binnie Rail, MD

## 2019-12-21 ENCOUNTER — Telehealth: Payer: Self-pay | Admitting: Internal Medicine

## 2019-12-21 ENCOUNTER — Other Ambulatory Visit: Payer: Self-pay

## 2019-12-26 NOTE — Progress Notes (Signed)
Virtual Visit via Video Note  I connected with Sylvia Conner on 12/26/19 at  9:30 AM EDT by a video enabled telemedicine application and verified that I am speaking with the correct person using two identifiers.   I discussed the limitations of evaluation and management by telemedicine and the availability of in person appointments. The patient expressed understanding and agreed to proceed.  Present for the visit:  Myself, Dr Billey Gosling, Lyzbeth Bellot.  The patient is currently at home and I am in the office.    No referring provider.    History of Present Illness: This is an acute visit for diarrhea.   Her symptoms initially started 9/10.  She started having watery diarrhea 4 times a day.  She did have cramping at that time.  She did take Pepto-Bismol.  The diarrhea has resolved.  At this time she is constipated and is having some small bowel movements, but they are pellets.  She has difficulty going to the bathroom and feels her bowel movements are incomplete.  She has bloating, cramping and she is very uncomfortable.  She initially had a mild fever but only when her symptoms started.     Review of Systems  Constitutional: Positive for fever (mild, initially). Negative for malaise/fatigue.       Scared to eat  HENT: Negative for congestion, ear pain and sore throat.   Respiratory: Negative for cough, shortness of breath and wheezing.   Gastrointestinal: Positive for abdominal pain (cramping), constipation and diarrhea. Negative for blood in stool, heartburn, nausea and vomiting.       Abdominal cramping and bloatin  Genitourinary: Negative for dysuria and frequency.  Musculoskeletal: Negative for myalgias.  Neurological: Negative for dizziness and headaches.      Social History   Socioeconomic History  . Marital status: Married    Spouse name: Not on file  . Number of children: Not on file  . Years of education: Not on file  . Highest education level: Not on file   Occupational History  . Not on file  Tobacco Use  . Smoking status: Never Smoker  . Smokeless tobacco: Never Used  Substance and Sexual Activity  . Alcohol use: No  . Drug use: No  . Sexual activity: Not on file  Other Topics Concern  . Not on file  Social History Narrative  . Not on file   Social Determinants of Health   Financial Resource Strain:   . Difficulty of Paying Living Expenses: Not on file  Food Insecurity:   . Worried About Charity fundraiser in the Last Year: Not on file  . Ran Out of Food in the Last Year: Not on file  Transportation Needs:   . Lack of Transportation (Medical): Not on file  . Lack of Transportation (Non-Medical): Not on file  Physical Activity:   . Days of Exercise per Week: Not on file  . Minutes of Exercise per Session: Not on file  Stress:   . Feeling of Stress : Not on file  Social Connections:   . Frequency of Communication with Friends and Family: Not on file  . Frequency of Social Gatherings with Friends and Family: Not on file  . Attends Religious Services: Not on file  . Active Member of Clubs or Organizations: Not on file  . Attends Archivist Meetings: Not on file  . Marital Status: Not on file     Observations/Objective: Appears well in NAD Breathing normally Skin appears normal  Assessment and Plan:  See Problem List for Assessment and Plan of chronic medical problems.   Follow Up Instructions:    I discussed the assessment and treatment plan with the patient. The patient was provided an opportunity to ask questions and all were answered. The patient agreed with the plan and demonstrated an understanding of the instructions.   The patient was advised to call back or seek an in-person evaluation if the symptoms worsen or if the condition fails to improve as anticipated.    Binnie Rail, MD

## 2019-12-27 ENCOUNTER — Encounter: Payer: Self-pay | Admitting: Internal Medicine

## 2019-12-27 ENCOUNTER — Telehealth (INDEPENDENT_AMBULATORY_CARE_PROVIDER_SITE_OTHER): Payer: Self-pay | Admitting: Internal Medicine

## 2019-12-27 DIAGNOSIS — K5900 Constipation, unspecified: Secondary | ICD-10-CM

## 2019-12-27 NOTE — Assessment & Plan Note (Signed)
Acute Advised miralax otc 2 / day until she feels she has emptied herself out Advised this will improve her bloating and uncomfortable feeling and improve her appetite Increase fluids Call if no improvement

## 2020-01-15 ENCOUNTER — Telehealth: Payer: Self-pay | Admitting: Internal Medicine

## 2020-01-15 NOTE — Telephone Encounter (Signed)
   Patient calling for status of Ozempic. Medication is usually delivery to office.  Patient states the sender says medication was delivered to office on 12/21/19 Tracking [phone]

## 2020-01-17 NOTE — Telephone Encounter (Signed)
Left message for patient to return call to clinic. I am not able to locate her Ozempic in the office.

## 2020-01-18 NOTE — Telephone Encounter (Signed)
Patient returned call unable to get in touch with you, if you could please return patient call @ [phone]

## 2020-01-18 NOTE — Telephone Encounter (Signed)
Patient came in today. She was able to provide paper with tracking number and it appears that [facility] signed for medication when it came in. Was unable to locate medication but we did have 3 samples left that I was able to give her to hold her over. She said she was told if Dr. Quay Burow would reorder script and send note stating that we misplaced or lost medication they would re-issue her another box for no cost. She is to call me back with the number for patience assistance. I have paperwork with number to fax script to.

## 2020-01-21 ENCOUNTER — Other Ambulatory Visit: Payer: Self-pay

## 2020-01-21 ENCOUNTER — Encounter: Payer: Self-pay | Admitting: Internal Medicine

## 2020-01-22 MED ORDER — OZEMPIC (1 MG/DOSE) 2 MG/1.5ML ~~LOC~~ SOPN: 1.0000 mg | PEN_INJECTOR | SUBCUTANEOUS | 3 refills | Status: DC

## 2020-01-22 NOTE — Addendum Note (Signed)
Addended by: Binnie Rail on: 01/22/2020 08:48 PM   Modules accepted: Orders

## 2020-01-22 NOTE — Telephone Encounter (Signed)
Printed

## 2020-01-28 ENCOUNTER — Other Ambulatory Visit: Payer: Self-pay | Admitting: Internal Medicine

## 2020-01-31 NOTE — Progress Notes (Signed)
Subjective:    Patient ID: Sylvia Conner, female    DOB: May 09, 1955, 64 y.o.   MRN: [id number]  HPI The patient is here for follow up of their chronic medical problems, including DM, htn, hyperlipidemia, hypothyroidism  She is not exercising regularly.  She is active   She is eating well   Her stomach growls a lot.  This happens a few times a week.  She was concerned about why she was having it.  No gas.  Bowels normal.   Medications and allergies reviewed with patient and updated if appropriate.  Patient Active Problem List   Diagnosis Date Noted  . Constipation 12/27/2019  . Pruritus 08/08/2019  . Episcleritis of left eye 06/21/2019  . Hyperlipidemia 11/30/2018  . Lipoma of arm 05/03/2017  . Eczema, feet 12/24/2016  . Left lower quadrant pain 05/04/2016  . Right shoulder pain 10/31/2015  . Adnexal mass 10/31/2015  . HTN (hypertension) 05/24/2011  . DM (diabetes mellitus) ([place]) 05/24/2011  . Hypothyroidism 05/24/2011  . History of hysterectomy 05/24/2011    Current Outpatient Medications on File Prior to Visit  Medication Sig Dispense Refill  . aspirin EC 81 MG tablet Take 81 mg by mouth daily.    Marland Kitchen atorvastatin (LIPITOR) 20 MG tablet Take 1 tablet (20 mg total) by mouth daily. 90 tablet 1  . clobetasol ointment (TEMOVATE) 2.03 % Apply 1 application topically 2 (two) times daily. Do not use for more than two weeks at a time 45 g 1  . hydrOXYzine (ATARAX/VISTARIL) 25 MG tablet Take 1 tablet (25 mg total) by mouth 3 (three) times daily as needed for itching. 90 tablet 2  . ibuprofen (ADVIL,MOTRIN) 600 MG tablet Take 1 tablet (600 mg total) by mouth every 6 (six) hours as needed. 30 tablet 0  . levothyroxine (SYNTHROID) 112 MCG tablet TAKE 1 TABLET BY MOUTH ONCE DAILY BEFORE BREAKFAST 90 tablet 1  . metFORMIN (GLUCOPHAGE) 1000 MG tablet TAKE 1 TABLET BY MOUTH TWICE DAILY WITH A MEAL 180 tablet 1  . Multiple Vitamins-Minerals (HAIR SKIN AND NAILS FORMULA PO) Take 1 tablet  by mouth daily.    . potassium chloride SA (KLOR-CON) 20 MEQ tablet Take 2 tablets (40 mEq total) by mouth daily. 180 tablet 1  . Semaglutide, 1 MG/DOSE, (OZEMPIC, 1 MG/DOSE,) 2 MG/1.5ML SOPN Inject 1 mg into the skin once a week. 9 mL 3  . triamterene-hydrochlorothiazide (MAXZIDE) 75-50 MG tablet TAKE 1 TABLET BY MOUTH ONCE DAILY IN THE MORNING 90 tablet 1   No current facility-administered medications on file prior to visit.    Past Medical History:  Diagnosis Date  . Anemia   . Arthritis   . Diabetes mellitus   . Hypertension   . Thyroid disease   . UTI (lower urinary tract infection)     Past Surgical History:  Procedure Laterality Date  . CESAREAN SECTION    . COLONOSCOPY    . PARTIAL HYSTERECTOMY     1995    Social History   Socioeconomic History  . Marital status: Married    Spouse name: Not on file  . Number of children: Not on file  . Years of education: Not on file  . Highest education level: Not on file  Occupational History  . Not on file  Tobacco Use  . Smoking status: Never Smoker  . Smokeless tobacco: Never Used  Substance and Sexual Activity  . Alcohol use: No  . Drug use: No  . Sexual activity: Not  on file  Other Topics Concern  . Not on file  Social History Narrative  . Not on file   Social Determinants of Health   Financial Resource Strain:   . Difficulty of Paying Living Expenses: Not on file  Food Insecurity:   . Worried About Charity fundraiser in the Last Year: Not on file  . Ran Out of Food in the Last Year: Not on file  Transportation Needs:   . Lack of Transportation (Medical): Not on file  . Lack of Transportation (Non-Medical): Not on file  Physical Activity:   . Days of Exercise per Week: Not on file  . Minutes of Exercise per Session: Not on file  Stress:   . Feeling of Stress : Not on file  Social Connections:   . Frequency of Communication with Friends and Family: Not on file  . Frequency of Social Gatherings with  Friends and Family: Not on file  . Attends Religious Services: Not on file  . Active Member of Clubs or Organizations: Not on file  . Attends Archivist Meetings: Not on file  . Marital Status: Not on file    Family History  Problem Relation Age of Onset  . Hypertension Mother   . Diabetes Mother   . Diabetes Unknown   . Diabetes Sister   . Diabetes Paternal Grandmother     Review of Systems  Constitutional: Negative for chills and fever.  Eyes: Negative for visual disturbance.  Respiratory: Negative for cough, shortness of breath and wheezing.   Cardiovascular: Positive for leg swelling (occ). Negative for chest pain and palpitations.  Gastrointestinal: Negative for abdominal pain, constipation and diarrhea.  Neurological: Negative for light-headedness and headaches.       Objective:   Vitals:   02/01/20 1438  BP: 126/70  Pulse: 93  Temp: 98.1 F (36.7 C)  SpO2: 97%   BP Readings from Last 3 Encounters:  02/01/20 126/70  08/08/19 (!) 148/90  06/21/19 134/80   Wt Readings from Last 3 Encounters:  02/01/20 161 lb (73 kg)  08/08/19 162 lb (73.5 kg)  06/21/19 157 lb (71.2 kg)   Body mass index is 29.45 kg/m.   Physical Exam    Constitutional: Appears well-developed and well-nourished. No distress.  HENT:  Head: Normocephalic and atraumatic.  Neck: Neck supple. No tracheal deviation present. No thyromegaly present.  No cervical lymphadenopathy Cardiovascular: Normal rate, regular rhythm and normal heart sounds.   No murmur heard. No carotid bruit .  No edema Pulmonary/Chest: Effort normal and breath sounds normal. No respiratory distress. No has no wheezes. No rales. Abdomen: Soft, nontender, nondistended Skin: Skin is warm and dry. Not diaphoretic.  Psychiatric: Normal mood and affect. Behavior is normal.      Assessment & Plan:    See Problem List for Assessment and Plan of chronic medical problems.    This visit occurred during the  SARS-CoV-2 public health emergency.  Safety protocols were in place, including screening questions prior to the visit, additional usage of staff PPE, and extensive cleaning of exam room while observing appropriate contact time as indicated for disinfecting solutions.

## 2020-01-31 NOTE — Patient Instructions (Addendum)
  Blood work was ordered.     Medications reviewed and updated.  Changes include :   none     Please followup in 6 months

## 2020-02-01 ENCOUNTER — Ambulatory Visit: Payer: Self-pay | Admitting: Internal Medicine

## 2020-02-01 ENCOUNTER — Other Ambulatory Visit: Payer: Self-pay

## 2020-02-01 ENCOUNTER — Encounter: Payer: Self-pay | Admitting: Internal Medicine

## 2020-02-01 VITALS — BP 126/70 | HR 93 | Temp 98.1°F | Ht 62.0 in | Wt 161.0 lb

## 2020-02-01 DIAGNOSIS — E782 Mixed hyperlipidemia: Secondary | ICD-10-CM | POA: Diagnosis not present

## 2020-02-01 DIAGNOSIS — I10 Essential (primary) hypertension: Secondary | ICD-10-CM

## 2020-02-01 DIAGNOSIS — Z794 Long term (current) use of insulin: Secondary | ICD-10-CM | POA: Diagnosis not present

## 2020-02-01 DIAGNOSIS — E039 Hypothyroidism, unspecified: Secondary | ICD-10-CM | POA: Diagnosis not present

## 2020-02-01 DIAGNOSIS — E119 Type 2 diabetes mellitus without complications: Secondary | ICD-10-CM | POA: Diagnosis not present

## 2020-02-01 LAB — LIPID PANEL
Cholesterol: 231 mg/dL — ABNORMAL HIGH (ref 0–200)
HDL: 70.5 mg/dL (ref >39.00)
LDL Cholesterol: 131 mg/dL — ABNORMAL HIGH (ref 0–99)
NonHDL: 160.03
Total CHOL/HDL Ratio: 3
Triglycerides: 146 mg/dL (ref 0.0–149.0)
VLDL: 29.2 mg/dL (ref 0.0–40.0)

## 2020-02-01 LAB — COMPREHENSIVE METABOLIC PANEL
ALT: 18 U/L (ref 0–35)
AST: 25 U/L (ref 0–37)
Albumin: 4.3 g/dL (ref 3.5–5.2)
Alkaline Phosphatase: 94 U/L (ref 39–117)
BUN: 10 mg/dL (ref 6–23)
CO2: 31 mEq/L (ref 19–32)
Calcium: 9.7 mg/dL (ref 8.4–10.5)
Chloride: 98 mEq/L (ref 96–112)
Creatinine, Ser: 0.91 mg/dL (ref 0.40–1.20)
GFR: 67.42 mL/min (ref >60.00)
Glucose, Bld: 205 mg/dL — ABNORMAL HIGH (ref 70–99)
Potassium: 3.5 mEq/L (ref 3.5–5.1)
Sodium: 138 mEq/L (ref 135–145)
Total Bilirubin: 0.6 mg/dL (ref 0.2–1.2)
Total Protein: 7.9 g/dL (ref 6.0–8.3)

## 2020-02-01 LAB — TSH: TSH: 7.51 u[IU]/mL — ABNORMAL HIGH (ref 0.35–4.50)

## 2020-02-01 LAB — MICROALBUMIN / CREATININE URINE RATIO
Creatinine,U: 75.7 mg/dL
Microalb Creat Ratio: 0.9 mg/g (ref 0.0–30.0)
Microalb, Ur: <0.7 mg/dL (ref 0.0–1.9)

## 2020-02-01 LAB — HEMOGLOBIN A1C: Hgb A1c MFr Bld: 8.6 % — ABNORMAL HIGH (ref 4.6–6.5)

## 2020-02-01 MED ORDER — BLOOD GLUCOSE MONITOR KIT: PACK | 0 refills | Status: AC

## 2020-02-01 NOTE — Assessment & Plan Note (Signed)
Chronic Check lipid panel  Continue atorvastatin 20 mg daily Regular exercise and healthy diet encouraged  

## 2020-02-01 NOTE — Assessment & Plan Note (Signed)
Chronic  Clinically euthyroid Currently taking levothyroxine 112 mcg daily Check tsh  Titrate med dose if needed

## 2020-02-01 NOTE — Assessment & Plan Note (Signed)
Chronic Check A1c, urine microalbumin Continue Ozempic 1 mg weekly and Metformin 1000 mg twice daily Stressed being active with regular exercise and compliance with a diabetic diet

## 2020-02-01 NOTE — Assessment & Plan Note (Signed)
Chronic BP well controlled Continue triamterene-hydrochlorothiazide 75-50 mg daily cmp

## 2020-02-04 MED ORDER — GLIPIZIDE 5 MG PO TABS: 5.0000 mg | ORAL_TABLET | Freq: Two times a day (BID) | ORAL | 5 refills | Status: DC

## 2020-02-04 MED ORDER — LEVOTHYROXINE SODIUM 125 MCG PO TABS: 125.0000 ug | ORAL_TABLET | Freq: Every day | ORAL | 3 refills | Status: DC

## 2020-02-04 NOTE — Addendum Note (Signed)
Addended by: Binnie Rail on: 02/04/2020 07:29 AM   Modules accepted: Orders

## 2020-02-06 ENCOUNTER — Telehealth: Payer: Self-pay | Admitting: Internal Medicine

## 2020-02-06 NOTE — Telephone Encounter (Signed)
Patient calling back she had a few questions about her lab results Patient # [phone]

## 2020-02-07 NOTE — Telephone Encounter (Signed)
Left message for patient to return call regarding lab results.  

## 2020-02-08 NOTE — Telephone Encounter (Signed)
Please return call to patient to discuss lab results and medication

## 2020-02-11 ENCOUNTER — Telehealth: Payer: Self-pay | Admitting: Internal Medicine

## 2020-02-11 MED ORDER — PIOGLITAZONE HCL 15 MG PO TABS: 15.0000 mg | ORAL_TABLET | Freq: Every day | ORAL | 5 refills | Status: DC

## 2020-02-11 NOTE — Telephone Encounter (Signed)
Ok - stop it.   We can try actos - sent to [facility]

## 2020-02-11 NOTE — Telephone Encounter (Signed)
glipiZIDE (GLUCOTROL) 5 MG tablet Patient states this medication is making her arms break out and wanted to see if she could have it changed to something else.

## 2020-02-11 NOTE — Telephone Encounter (Signed)
Message left for patient to return call to clinic.

## 2020-02-12 NOTE — Telephone Encounter (Signed)
Left message for patient today with info. 

## 2020-04-01 ENCOUNTER — Telehealth: Payer: Self-pay | Admitting: Internal Medicine

## 2020-04-01 NOTE — Telephone Encounter (Signed)
Spoke with patient today. Called patient assistance a few times today but longest hold time was 40 mins today and then I was disconnected.  She understands I will reach out to our rep to see if he can bring some samples by and I can call her.  Message does state with patient assistance that they are experiencing shipping delays as well and patient has been advised.

## 2020-04-01 NOTE — Telephone Encounter (Signed)
Patient calling upset because she still has not received her Semaglutide, 1 MG/DOSE, (OZEMPIC, 1 MG/DOSE,) 2 MG/1.5ML SOPN. States she has been talking to [place] about this for the past three months and it has been misplaced and she still hasn't received it.  Fax number: [phone]

## 2020-04-02 NOTE — Telephone Encounter (Signed)
° ° °  Please return call to patient °

## 2020-04-03 MED ORDER — OZEMPIC (1 MG/DOSE) 2 MG/1.5ML ~~LOC~~ SOPN: 1.0000 mg | PEN_INJECTOR | SUBCUTANEOUS | 3 refills | Status: DC

## 2020-04-03 NOTE — Addendum Note (Signed)
Addended by: Pincus Sanes on: 04/03/2020 09:50 AM   Modules accepted: Orders

## 2020-04-03 NOTE — Telephone Encounter (Signed)
rx printed

## 2020-04-25 ENCOUNTER — Ambulatory Visit: Payer: 59 | Admitting: Internal Medicine

## 2020-05-02 ENCOUNTER — Ambulatory Visit: Payer: 59 | Admitting: Internal Medicine

## 2020-05-20 ENCOUNTER — Other Ambulatory Visit: Payer: Self-pay | Admitting: Internal Medicine

## 2020-05-20 DIAGNOSIS — Z1231 Encounter for screening mammogram for malignant neoplasm of breast: Secondary | ICD-10-CM

## 2020-06-02 NOTE — Progress Notes (Signed)
Subjective:    Patient ID: Sylvia Conner, female    DOB: 16-Apr-1955, 65 y.o.   MRN: [id number]  HPI The patient is here for an acute visit.   Joint pain -- she notice it 4 days ago. She had diffuse "bone " pain.  Her joints hurt.  It was hard to say if it was muscle or bones.  She woke up with the pain.  The back of her arms were sore.  She denies new or usual activities the day prior.  She has been taking BC powder when it is bad (one day only) and that helps. She notices it mostly in the morning when she is getting out of bed. It gets better with moving around once she gets up.  She thinks the pain has gotten a Melamed better.  She denies joint swelling.  She had difficulty getting out of bed, but during the day had no issues getting out of a chair.  No difficulty doing hair.    She notice her breathing is different. She notices when she is sitting. She needs to take a deep breath and at that time feels like something is pushing in her RUQ region. She would also have sharp right sided chest pain that would last 1-2 seconds. She just needs to take one deep breaths and then she is fine. She denies any shortness of breath when she walks. She denies coughing or wheezing.    Medications and allergies reviewed with patient and updated if appropriate.  Patient Active Problem List   Diagnosis Date Noted  . Constipation 12/27/2019  . Pruritus 08/08/2019  . Episcleritis of left eye 06/21/2019  . Hyperlipidemia 11/30/2018  . Lipoma of arm 05/03/2017  . Eczema, feet 12/24/2016  . Left lower quadrant pain 05/04/2016  . Right shoulder pain 10/31/2015  . Adnexal mass 10/31/2015  . HTN (hypertension) 05/24/2011  . DM (diabetes mellitus) ([place]) 05/24/2011  . Hypothyroidism 05/24/2011  . History of hysterectomy 05/24/2011    Current Outpatient Medications on File Prior to Visit  Medication Sig Dispense Refill  . aspirin EC 81 MG tablet Take 81 mg by mouth daily.    Marland Kitchen atorvastatin (LIPITOR) 20  MG tablet Take 1 tablet (20 mg total) by mouth daily. 90 tablet 1  . blood glucose meter kit and supplies KIT Dispense based on patient and insurance preference. Use up to four times daily as directed. (FOR E11.9). 1 each 0  . clobetasol ointment (TEMOVATE) 6.96 % Apply 1 application topically 2 (two) times daily. Do not use for more than two weeks at a time 45 g 1  . hydrOXYzine (ATARAX/VISTARIL) 25 MG tablet Take 1 tablet (25 mg total) by mouth 3 (three) times daily as needed for itching. 90 tablet 2  . ibuprofen (ADVIL,MOTRIN) 600 MG tablet Take 1 tablet (600 mg total) by mouth every 6 (six) hours as needed. 30 tablet 0  . levothyroxine (SYNTHROID) 125 MCG tablet Take 1 tablet (125 mcg total) by mouth daily. 90 tablet 3  . metFORMIN (GLUCOPHAGE) 1000 MG tablet TAKE 1 TABLET BY MOUTH TWICE DAILY WITH A MEAL 180 tablet 1  . Multiple Vitamins-Minerals (HAIR SKIN AND NAILS FORMULA PO) Take 1 tablet by mouth daily.    . pioglitazone (ACTOS) 15 MG tablet Take 1 tablet (15 mg total) by mouth daily. 30 tablet 5  . potassium chloride SA (KLOR-CON) 20 MEQ tablet Take 2 tablets (40 mEq total) by mouth daily. 180 tablet 1  . Semaglutide, 1  MG/DOSE, (OZEMPIC, 1 MG/DOSE,) 2 MG/1.5ML SOPN Inject 1 mg into the skin once a week. 9 mL 3  . triamterene-hydrochlorothiazide (MAXZIDE) 75-50 MG tablet TAKE 1 TABLET BY MOUTH ONCE DAILY IN THE MORNING 90 tablet 1   No current facility-administered medications on file prior to visit.    Past Medical History:  Diagnosis Date  . Anemia   . Arthritis   . Diabetes mellitus   . Hypertension   . Thyroid disease   . UTI (lower urinary tract infection)     Past Surgical History:  Procedure Laterality Date  . CESAREAN SECTION    . COLONOSCOPY    . PARTIAL HYSTERECTOMY     1995    Social History   Socioeconomic History  . Marital status: Married    Spouse name: Not on file  . Number of children: Not on file  . Years of education: Not on file  . Highest  education level: Not on file  Occupational History  . Not on file  Tobacco Use  . Smoking status: Never Smoker  . Smokeless tobacco: Never Used  Substance and Sexual Activity  . Alcohol use: No  . Drug use: No  . Sexual activity: Not on file  Other Topics Concern  . Not on file  Social History Narrative  . Not on file   Social Determinants of Health   Financial Resource Strain: Not on file  Food Insecurity: Not on file  Transportation Needs: Not on file  Physical Activity: Not on file  Stress: Not on file  Social Connections: Not on file    Family History  Problem Relation Age of Onset  . Hypertension Mother   . Diabetes Mother   . Diabetes Unknown   . Diabetes Sister   . Diabetes Paternal Grandmother     Review of Systems  Constitutional: Negative for chills and fever.  Respiratory: Negative for cough, shortness of breath and wheezing.   Cardiovascular: Negative for chest pain, palpitations and leg swelling.  Gastrointestinal: Negative for abdominal pain, constipation, diarrhea (just occasional) and nausea.  Musculoskeletal: Positive for arthralgias and myalgias. Negative for joint swelling and neck pain.  Neurological: Positive for dizziness (couple of weeks ago - lasted one day). Negative for headaches.       Objective:   Vitals:   06/03/20 0854  BP: 120/72  Pulse: 87  Temp: 98 F (36.7 C)  SpO2: 98%   BP Readings from Last 3 Encounters:  06/03/20 120/72  02/01/20 126/70  08/08/19 (!) 148/90   Wt Readings from Last 3 Encounters:  06/03/20 158 lb (71.7 kg)  02/01/20 161 lb (73 kg)  08/08/19 162 lb (73.5 kg)   Body mass index is 28.9 kg/m.   Physical Exam    Constitutional: Appears well-developed and well-nourished. No distress.  Head: Normocephalic and atraumatic.  Neck: Neck supple. No tracheal deviation present. No thyromegaly present.  No cervical lymphadenopathy Cardiovascular: Normal rate, regular rhythm and normal heart sounds.  No  murmur heard. No carotid bruit .  No edema Pulmonary/Chest: Effort normal and breath sounds normal. No respiratory distress. No has no wheezes. No rales.  Abdomen: soft, NT, ND Msk: mild muscle tenderness in upper back and upper arms - no leg muscle tenderness.  No joint swelling or deformities Skin: Skin is warm and dry. Not diaphoretic. No rsash Psychiatric: Normal mood and affect. Behavior is normal.       Assessment & Plan:    See Problem List for Assessment and Plan  of chronic medical problems.    This visit occurred during the SARS-CoV-2 public health emergency.  Safety protocols were in place, including screening questions prior to the visit, additional usage of staff PPE, and extensive cleaning of exam room while observing appropriate contact time as indicated for disinfecting solutions.

## 2020-06-03 ENCOUNTER — Encounter: Payer: Self-pay | Admitting: Internal Medicine

## 2020-06-03 ENCOUNTER — Ambulatory Visit (INDEPENDENT_AMBULATORY_CARE_PROVIDER_SITE_OTHER): Payer: 59

## 2020-06-03 ENCOUNTER — Ambulatory Visit (INDEPENDENT_AMBULATORY_CARE_PROVIDER_SITE_OTHER): Payer: 59 | Admitting: Internal Medicine

## 2020-06-03 ENCOUNTER — Other Ambulatory Visit: Payer: Self-pay

## 2020-06-03 VITALS — BP 120/72 | HR 87 | Temp 98.0°F | Ht 62.0 in | Wt 158.0 lb

## 2020-06-03 DIAGNOSIS — M2550 Pain in unspecified joint: Secondary | ICD-10-CM | POA: Diagnosis not present

## 2020-06-03 DIAGNOSIS — E782 Mixed hyperlipidemia: Secondary | ICD-10-CM | POA: Diagnosis not present

## 2020-06-03 DIAGNOSIS — R768 Other specified abnormal immunological findings in serum: Secondary | ICD-10-CM

## 2020-06-03 DIAGNOSIS — Z794 Long term (current) use of insulin: Secondary | ICD-10-CM | POA: Diagnosis not present

## 2020-06-03 DIAGNOSIS — E1165 Type 2 diabetes mellitus with hyperglycemia: Secondary | ICD-10-CM

## 2020-06-03 DIAGNOSIS — M791 Myalgia, unspecified site: Secondary | ICD-10-CM

## 2020-06-03 DIAGNOSIS — R079 Chest pain, unspecified: Secondary | ICD-10-CM | POA: Insufficient documentation

## 2020-06-03 DIAGNOSIS — L309 Dermatitis, unspecified: Secondary | ICD-10-CM

## 2020-06-03 DIAGNOSIS — E039 Hypothyroidism, unspecified: Secondary | ICD-10-CM

## 2020-06-03 DIAGNOSIS — R700 Elevated erythrocyte sedimentation rate: Secondary | ICD-10-CM

## 2020-06-03 LAB — CBC WITH DIFFERENTIAL/PLATELET
Basophils Absolute: 0.1 10*3/uL (ref 0.0–0.1)
Basophils Relative: 1.1 % (ref 0.0–3.0)
Eosinophils Absolute: 0.1 10*3/uL (ref 0.0–0.7)
Eosinophils Relative: 2.3 % (ref 0.0–5.0)
HCT: 38 % (ref 36.0–46.0)
Hemoglobin: 12.3 g/dL (ref 12.0–15.0)
Lymphocytes Relative: 35.1 % (ref 12.0–46.0)
Lymphs Abs: 1.6 10*3/uL (ref 0.7–4.0)
MCHC: 32.4 g/dL (ref 30.0–36.0)
MCV: 75.6 fl — ABNORMAL LOW (ref 78.0–100.0)
Monocytes Absolute: 0.3 10*3/uL (ref 0.1–1.0)
Monocytes Relative: 6.2 % (ref 3.0–12.0)
Neutro Abs: 2.6 10*3/uL (ref 1.4–7.7)
Neutrophils Relative %: 55.3 % (ref 43.0–77.0)
Platelets: 310 10*3/uL (ref 150.0–400.0)
RBC: 5.02 Mil/uL (ref 3.87–5.11)
RDW: 13.7 % (ref 11.5–15.5)
WBC: 4.7 10*3/uL (ref 4.0–10.5)

## 2020-06-03 LAB — LIPID PANEL
Cholesterol: 227 mg/dL — ABNORMAL HIGH (ref 0–200)
HDL: 79.2 mg/dL (ref >39.00)
LDL Cholesterol: 135 mg/dL — ABNORMAL HIGH (ref 0–99)
NonHDL: 148.07
Total CHOL/HDL Ratio: 3
Triglycerides: 63 mg/dL (ref 0.0–149.0)
VLDL: 12.6 mg/dL (ref 0.0–40.0)

## 2020-06-03 LAB — COMPREHENSIVE METABOLIC PANEL
ALT: 18 U/L (ref 0–35)
AST: 22 U/L (ref 0–37)
Albumin: 4.3 g/dL (ref 3.5–5.2)
Alkaline Phosphatase: 106 U/L (ref 39–117)
BUN: 14 mg/dL (ref 6–23)
CO2: 30 mEq/L (ref 19–32)
Calcium: 9.7 mg/dL (ref 8.4–10.5)
Chloride: 97 mEq/L (ref 96–112)
Creatinine, Ser: 0.78 mg/dL (ref 0.40–1.20)
GFR: 80.93 mL/min (ref >60.00)
Glucose, Bld: 161 mg/dL — ABNORMAL HIGH (ref 70–99)
Potassium: 4 mEq/L (ref 3.5–5.1)
Sodium: 134 mEq/L — ABNORMAL LOW (ref 135–145)
Total Bilirubin: 0.8 mg/dL (ref 0.2–1.2)
Total Protein: 8.2 g/dL (ref 6.0–8.3)

## 2020-06-03 LAB — SEDIMENTATION RATE: Sed Rate: 52 mm/hr — ABNORMAL HIGH (ref 0–30)

## 2020-06-03 LAB — C-REACTIVE PROTEIN: CRP: <1 mg/dL (ref 0.5–20.0)

## 2020-06-03 LAB — HEMOGLOBIN A1C: Hgb A1c MFr Bld: 8.6 % — ABNORMAL HIGH (ref 4.6–6.5)

## 2020-06-03 LAB — TSH: TSH: 2.7 u[IU]/mL (ref 0.35–4.50)

## 2020-06-03 MED ORDER — TRIAMCINOLONE ACETONIDE 0.5 % EX CREA: TOPICAL_CREAM | Freq: Two times a day (BID) | CUTANEOUS | 1 refills | Status: DC | PRN

## 2020-06-03 NOTE — Assessment & Plan Note (Signed)
Acute-started suddenly 4 days ago Improved with BC powder as needed Worse first thing in the morning and improves as she gets up and moves around Experiencing muscle or bone pain-sounds like a combination of both She does have some mild muscle tenderness in her upper back and upper arms ? PMR, autoimmune disease ANA, RF, CRP, ESR She has been on her statin for a while so I doubt this is related to that

## 2020-06-03 NOTE — Patient Instructions (Signed)
  Blood work was ordered.  A chest xray was ordered.    Medications changes include :   None

## 2020-06-03 NOTE — Assessment & Plan Note (Signed)
Chronic Ongoing eczema in right foot Needs a refill of steroid cream Clobetasol no longer covered-start triamcinolone 0.5 mg twice daily as needed

## 2020-06-03 NOTE — Assessment & Plan Note (Signed)
Acute Having intermittent right-sided chest pain that is sharp and lasting 1-2 seconds when she takes a deep breath when sitting Not characteristic of cardiac pain Likely musculoskeletal in nature We will check chest x-ray

## 2020-06-03 NOTE — Assessment & Plan Note (Signed)
Acute-started suddenly 4 days ago Improved with BC powder as needed Worse first thing in the morning and improves as she gets up and moves around Experiencing muscle or bone pain-sounds like a combination of both She does have some mild muscle tenderness in her upper back and upper arms, no joint swelling ? PMR, autoimmune disease ANA, RF, CRP, ESR She has been on her statin for a while so I doubt this is related to that

## 2020-06-03 NOTE — Assessment & Plan Note (Signed)
Chronic We will check lipid panel since she is having blood work Continue atorvastatin 20 mg daily-doubt that her muscle pain is related to the statin since she has been on this for a while

## 2020-06-03 NOTE — Assessment & Plan Note (Signed)
Chronic Symptoms unlikely related to thyroid, but will check TSH and adjust medication if needed Currently taking levothyroxine 125 mcg daily-continue for now

## 2020-06-03 NOTE — Assessment & Plan Note (Signed)
Chronic Has not been well controlled Likely not related to her current symptoms, but will check A1c Continue Metformin twice daily with meals, Ozempic 1 mg weekly and Actos 15 mg daily-we will need to adjust medications if sugars not controlled

## 2020-06-05 LAB — RHEUMATOID FACTOR: Rheumatoid fact SerPl-aCnc: <14 IU/mL (ref <14)

## 2020-06-05 LAB — ANA: Anti Nuclear Antibody (ANA): POSITIVE — AB

## 2020-06-05 LAB — ANTI-NUCLEAR AB-TITER (ANA TITER): ANA Titer 1: 1:320 {titer} — ABNORMAL HIGH

## 2020-06-05 MED ORDER — PIOGLITAZONE HCL 30 MG PO TABS: 30.0000 mg | ORAL_TABLET | Freq: Every day | ORAL | 5 refills | Status: DC

## 2020-06-05 NOTE — Addendum Note (Signed)
Addended by: Binnie Rail on: 06/05/2020 08:21 PM   Modules accepted: Orders

## 2020-06-07 ENCOUNTER — Other Ambulatory Visit: Payer: Self-pay | Admitting: Internal Medicine

## 2020-06-07 DIAGNOSIS — E1165 Type 2 diabetes mellitus with hyperglycemia: Secondary | ICD-10-CM

## 2020-06-07 DIAGNOSIS — Z794 Long term (current) use of insulin: Secondary | ICD-10-CM

## 2020-06-09 ENCOUNTER — Telehealth: Payer: Self-pay | Admitting: Internal Medicine

## 2020-06-09 NOTE — Telephone Encounter (Signed)
Left message for patient.  If she calls back please verify if she has been taking all 3 diabetic medications.  Also see if she recalls eating something that could have spiked her numbers?  (I did leave all this info on her VM so if she calls back please let me know her answers).

## 2020-06-09 NOTE — Telephone Encounter (Signed)
Patient calling to report her blood sugar is 178. She is concerned this is too high She does not have her medication with her.  She will be unable to take Metformin until around 7 tonight.   Seeking advice

## 2020-06-09 NOTE — Telephone Encounter (Signed)
Her sugar is not dangerously high.  Has she been taking all three of her medications daily?  Did she eat anything that may have caused her sugar to go up?  I hate to adjust anything now since we just increased her actos.

## 2020-07-08 ENCOUNTER — Ambulatory Visit: Payer: 59

## 2020-07-22 NOTE — Progress Notes (Deleted)
Office Visit Note  Patient: Sylvia Conner             Date of Birth: 1956-03-01           MRN: [id number]             PCP: Binnie Rail, MD Referring: Binnie Rail, MD Visit Date: 08/05/2020 Occupation: @GUAROCC @  Subjective:  No chief complaint on file.   History of Present Illness: Sylvia Conner is a 65 y.o. female ***   Activities of Daily Living:  Patient reports morning stiffness for *** {minute/hour:19697}.   Patient {ACTIONS;DENIES/REPORTS:21021675::"Denies"} nocturnal pain.  Difficulty dressing/grooming: {ACTIONS;DENIES/REPORTS:21021675::"Denies"} Difficulty climbing stairs: {ACTIONS;DENIES/REPORTS:21021675::"Denies"} Difficulty getting out of chair: {ACTIONS;DENIES/REPORTS:21021675::"Denies"} Difficulty using hands for taps, buttons, cutlery, and/or writing: {ACTIONS;DENIES/REPORTS:21021675::"Denies"}  No Rheumatology ROS completed.   PMFS History:  Patient Active Problem List   Diagnosis Date Noted  . Myalgia 06/03/2020  . Arthralgia 06/03/2020  . Right-sided chest pain 06/03/2020  . Constipation 12/27/2019  . Pruritus 08/08/2019  . Episcleritis of left eye 06/21/2019  . Hyperlipidemia 11/30/2018  . Lipoma of arm 05/03/2017  . Eczema, feet 12/24/2016  . Left lower quadrant pain 05/04/2016  . Right shoulder pain 10/31/2015  . Adnexal mass 10/31/2015  . HTN (hypertension) 05/24/2011  . DM (diabetes mellitus) ([place]) 05/24/2011  . Hypothyroidism 05/24/2011  . History of hysterectomy 05/24/2011    Past Medical History:  Diagnosis Date  . Anemia   . Arthritis   . Diabetes mellitus   . Hypertension   . Thyroid disease   . UTI (lower urinary tract infection)     Family History  Problem Relation Age of Onset  . Hypertension Mother   . Diabetes Mother   . Diabetes Unknown   . Diabetes Sister   . Diabetes Paternal Grandmother    Past Surgical History:  Procedure Laterality Date  . CESAREAN SECTION    . COLONOSCOPY    . PARTIAL HYSTERECTOMY      1995   Social History   Social History Narrative  . Not on file   Immunization History  Administered Date(s) Administered  . Influenza,inj,Quad PF,6+ Mos 12/24/2016  . Influenza-Unspecified 01/11/2015  . PFIZER(Purple Top)SARS-COV-2 Vaccination 08/09/2019, 08/30/2019, 03/06/2020  . Pneumococcal-Unspecified 02/09/2013  . Td 06/03/2000  . Tdap 04/30/2014, 08/31/2016     Objective: Vital Signs: There were no vitals taken for this visit.   Physical Exam   Musculoskeletal Exam: ***  CDAI Exam: CDAI Score: -- Patient Global: --; Provider Global: -- Swollen: --; Tender: -- Joint Exam 08/05/2020   No joint exam has been documented for this visit   There is currently no information documented on the homunculus. Go to the Rheumatology activity and complete the homunculus joint exam.  Investigation: No additional findings.  Imaging: No results found.  Recent Labs: Lab Results  Component Value Date   WBC 4.7 06/03/2020   HGB 12.3 06/03/2020   PLT 310.0 06/03/2020   NA 134 (L) 06/03/2020   K 4.0 06/03/2020   CL 97 06/03/2020   CO2 30 06/03/2020   GLUCOSE 161 (H) 06/03/2020   BUN 14 06/03/2020   CREATININE 0.78 06/03/2020   BILITOT 0.8 06/03/2020   ALKPHOS 106 06/03/2020   AST 22 06/03/2020   ALT 18 06/03/2020   PROT 8.2 06/03/2020   ALBUMIN 4.3 06/03/2020   CALCIUM 9.7 06/03/2020   GFRAA >60 04/22/2016    Speciality Comments: No specialty comments available.  Procedures:  No procedures performed Allergies: Penicillins, Glipizide, Januvia [sitagliptin], Sulfa antibiotics,  and Scallops [shellfish allergy]   Assessment / Plan:     Visit Diagnoses: No diagnosis found.  Orders: No orders of the defined types were placed in this encounter.  No orders of the defined types were placed in this encounter.   Face-to-face time spent with patient was *** minutes. Greater than 50% of time was spent in counseling and coordination of care.  Follow-Up Instructions:  No follow-ups on file.   Ofilia Neas, PA-C  Note - This record has been created using Dragon software.  Chart creation errors have been sought, but may not always  have been located. Such creation errors do not reflect on  the standard of medical care.

## 2020-07-28 ENCOUNTER — Ambulatory Visit: Payer: 59 | Admitting: Registered"

## 2020-07-28 ENCOUNTER — Other Ambulatory Visit: Payer: Self-pay | Admitting: Internal Medicine

## 2020-08-01 ENCOUNTER — Ambulatory Visit: Payer: 59 | Admitting: Internal Medicine

## 2020-08-04 ENCOUNTER — Ambulatory Visit: Payer: Self-pay | Admitting: Surgery

## 2020-08-04 NOTE — H&P (Signed)
  History of Present Illness Sylvia Conner. Jauna Raczynski MD; 08/04/2020 11:44 AM) The patient is a 65 year old female who presents with a complaint of Mass. Referred by Dr. Celso Amy for subcutaneous mass right upper arm and right chest This is a 64 year old female who presents with more than 10 year history of enlarging masses on her right upper lateral arm as well as her right chest. These have caused some discomfort. They are noticeably enlarged. The mass on her arm is causing some pain that radiates down her arm towards her hand. She would like to have these removed. There has not been any imaging of these areas.  She was originally seen in 2019 and we recommended surgery. The patient claims that she did not schedule surgery because of financial reasons. The mass on her right arm is causing more tenderness. It is slightly larger. She would like to have this removed. The right chest wall mass is also slightly larger.   Allergies Holli Humbles, CNA; 08/04/2020 10:52 AM) Penicillins Sulfa Antibiotics Swelling. Januvia *ANTIDIABETICS* Nausea. Allergies Reconciled  Medication History [employer], CNA; 08/04/2020 10:52 AM) Maxidex (Ophthalmic) Specific strength unknown - Active. Levothyroxine Sodium (112MCG Tablet, Oral) Active. MetFORMIN HCl ER (Oral) Specific strength unknown - Active. Toujeo SoloStar (300UNIT/ML Soln Pen-inj, Subcutaneous) Active. Medications Reconciled    Vitals [employer] Robinson CNA; 08/04/2020 10:56 AM) 08/04/2020 10:55 AM Weight: 162.6 lb Height: 62in Body Surface Area: 1.75 m Body Mass Index: 29.74 kg/m  Temp.: 72F  Pulse: 88 (Regular)  P.OX: 88% (Room air) BP: 162/74(Sitting, Left Arm, Standard)        Physical Exam Rodman Key K. Damyah Gugel MD; 08/04/2020 11:44 AM)  The physical exam findings are as follows: Note:WDWN in NAD Eyes: Pupils equal, round; sclera anicteric HENT: Oral mucosa moist; good dentition Neck: No masses palpated, no  thyromegaly Lungs: CTA bilaterally; normal respiratory effort Right lateral chest - oval-shaped 3 cm subcutaneous mass, smooth, well-demarcated; mildly tender Right lateral upper arm - protruding 4 cm subcutaneous mass, smooth, well-demarcated, mildly tender CV: Regular rate and rhythm; no murmurs; extremities well-perfused with no edema Abd: +bowel sounds, soft, non-tender, no palpable organomegaly; no palpable hernias Skin: Warm, dry; no sign of jaundice Psychiatric - alert and oriented x 4; calm mood and affect    Assessment & Plan Rodman Key K. 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 The surgical procedure has been discussed with the patient. Potential risks, benefits, alternative treatments, and expected outcomes have been explained. All of the patient's questions at this time have been answered. The likelihood of reaching the patient's treatment goal is good. The patient understand the proposed surgical procedure and wishes to proceed.    Sylvia Conner. Georgette Dover, MD, [facility] Surgery  General/ Trauma Surgery   08/04/2020 11:45 AM

## 2020-08-05 ENCOUNTER — Ambulatory Visit: Payer: 59 | Admitting: Rheumatology

## 2020-08-05 DIAGNOSIS — E1165 Type 2 diabetes mellitus with hyperglycemia: Secondary | ICD-10-CM

## 2020-08-05 DIAGNOSIS — M791 Myalgia, unspecified site: Secondary | ICD-10-CM

## 2020-08-05 DIAGNOSIS — H15102 Unspecified episcleritis, left eye: Secondary | ICD-10-CM

## 2020-08-05 DIAGNOSIS — E039 Hypothyroidism, unspecified: Secondary | ICD-10-CM

## 2020-08-05 DIAGNOSIS — I10 Essential (primary) hypertension: Secondary | ICD-10-CM

## 2020-08-05 DIAGNOSIS — M2550 Pain in unspecified joint: Secondary | ICD-10-CM

## 2020-08-05 DIAGNOSIS — R700 Elevated erythrocyte sedimentation rate: Secondary | ICD-10-CM

## 2020-08-05 DIAGNOSIS — R768 Other specified abnormal immunological findings in serum: Secondary | ICD-10-CM

## 2020-08-05 DIAGNOSIS — Z90710 Acquired absence of both cervix and uterus: Secondary | ICD-10-CM

## 2020-08-05 DIAGNOSIS — Z8639 Personal history of other endocrine, nutritional and metabolic disease: Secondary | ICD-10-CM

## 2020-08-18 ENCOUNTER — Ambulatory Visit (INDEPENDENT_AMBULATORY_CARE_PROVIDER_SITE_OTHER): Payer: 59 | Admitting: Internal Medicine

## 2020-08-18 ENCOUNTER — Telehealth: Payer: Self-pay

## 2020-08-18 ENCOUNTER — Other Ambulatory Visit: Payer: Self-pay

## 2020-08-18 ENCOUNTER — Encounter: Payer: Self-pay | Admitting: Internal Medicine

## 2020-08-18 VITALS — BP 140/80 | HR 95 | Temp 98.9°F | Ht 62.0 in | Wt 159.8 lb

## 2020-08-18 DIAGNOSIS — N644 Mastodynia: Secondary | ICD-10-CM

## 2020-08-18 MED ORDER — MELOXICAM 15 MG PO TABS: 15.0000 mg | ORAL_TABLET | Freq: Every day | ORAL | 0 refills | Status: DC

## 2020-08-18 NOTE — Progress Notes (Signed)
Subjective:    Patient ID: Sylvia Conner, female    DOB: 10-Jun-1955, 65 y.o.   MRN: [id number]  HPI The patient is here for an acute visit.   Sore right breast and ? some swelling of the breast- it started about two weeks ago.  She has intermittent throbbing in the upper breast and the lateral breast.  At times it aches.  She denies skin changes.  The areola on the right looks larger than the left - she is not sure if this is new or not.      She has chronic right shoulder pain that shoots down the right arm.   Yesterday she did have some numbness/tingling in the right hand.  After a while that resolved with shaking her hand.  She has some upper back pain on the right.  Sometimes the pain in the lateral breast with rasing her right arm.     Medications and allergies reviewed with patient and updated if appropriate.  Patient Active Problem List   Diagnosis Date Noted  . Myalgia 06/03/2020  . Arthralgia 06/03/2020  . Right-sided chest pain 06/03/2020  . Constipation 12/27/2019  . Pruritus 08/08/2019  . Episcleritis of left eye 06/21/2019  . Hyperlipidemia 11/30/2018  . Lipoma of arm 05/03/2017  . Eczema, feet 12/24/2016  . Left lower quadrant pain 05/04/2016  . Right shoulder pain 10/31/2015  . Adnexal mass 10/31/2015  . HTN (hypertension) 05/24/2011  . DM (diabetes mellitus) ([place]) 05/24/2011  . Hypothyroidism 05/24/2011  . History of hysterectomy 05/24/2011    Current Outpatient Medications on File Prior to Visit  Medication Sig Dispense Refill  . aspirin EC 81 MG tablet Take 81 mg by mouth daily.    Marland Kitchen atorvastatin (LIPITOR) 20 MG tablet Take 1 tablet (20 mg total) by mouth daily. 90 tablet 1  . blood glucose meter kit and supplies KIT Dispense based on patient and insurance preference. Use up to four times daily as directed. (FOR E11.9). 1 each 0  . hydrOXYzine (ATARAX/VISTARIL) 25 MG tablet Take 1 tablet (25 mg total) by mouth 3 (three) times daily as needed for  itching. 90 tablet 2  . ibuprofen (ADVIL,MOTRIN) 600 MG tablet Take 1 tablet (600 mg total) by mouth every 6 (six) hours as needed. 30 tablet 0  . levothyroxine (SYNTHROID) 125 MCG tablet Take 1 tablet (125 mcg total) by mouth daily. 90 tablet 3  . metFORMIN (GLUCOPHAGE) 1000 MG tablet TAKE 1 TABLET BY MOUTH TWICE DAILY WITH A MEAL 180 tablet 1  . Multiple Vitamins-Minerals (HAIR SKIN AND NAILS FORMULA PO) Take 1 tablet by mouth daily.    . pioglitazone (ACTOS) 30 MG tablet Take 1 tablet (30 mg total) by mouth daily. 30 tablet 5  . potassium chloride SA (KLOR-CON) 20 MEQ tablet Take 2 tablets (40 mEq total) by mouth daily. 180 tablet 1  . Semaglutide, 1 MG/DOSE, (OZEMPIC, 1 MG/DOSE,) 2 MG/1.5ML SOPN Inject 1 mg into the skin once a week. 9 mL 3  . triamcinolone cream (KENALOG) 0.5 % Apply topically 2 (two) times daily as needed. 30 g 1  . triamterene-hydrochlorothiazide (MAXZIDE) 75-50 MG tablet TAKE 1 TABLET BY MOUTH ONCE DAILY IN THE MORNING 90 tablet 0   No current facility-administered medications on file prior to visit.    Past Medical History:  Diagnosis Date  . Anemia   . Arthritis   . Diabetes mellitus   . Hypertension   . Thyroid disease   . UTI (lower urinary  tract infection)     Past Surgical History:  Procedure Laterality Date  . CESAREAN SECTION    . COLONOSCOPY    . PARTIAL HYSTERECTOMY     1995    Social History   Socioeconomic History  . Marital status: Married    Spouse name: Not on file  . Number of children: Not on file  . Years of education: Not on file  . Highest education level: Not on file  Occupational History  . Not on file  Tobacco Use  . Smoking status: Never Smoker  . Smokeless tobacco: Never Used  Substance and Sexual Activity  . Alcohol use: No  . Drug use: No  . Sexual activity: Not on file  Other Topics Concern  . Not on file  Social History Narrative  . Not on file   Social Determinants of Health   Financial Resource Strain: Not  on file  Food Insecurity: Not on file  Transportation Needs: Not on file  Physical Activity: Not on file  Stress: Not on file  Social Connections: Not on file    Family History  Problem Relation Age of Onset  . Hypertension Mother   . Diabetes Mother   . Diabetes Unknown   . Diabetes Sister   . Diabetes Paternal Grandmother     Review of Systems Per HPI    Objective:   Vitals:   08/18/20 1015  BP: 140/80  Pulse: 95  Temp: 98.9 F (37.2 C)  SpO2: 98%   BP Readings from Last 3 Encounters:  08/18/20 140/80  06/03/20 120/72  02/01/20 126/70   Wt Readings from Last 3 Encounters:  08/18/20 159 lb 12.8 oz (72.5 kg)  06/03/20 158 lb (71.7 kg)  02/01/20 161 lb (73 kg)   Body mass index is 29.23 kg/m.   Physical Exam Constitutional:      General: She is not in acute distress.    Appearance: Normal appearance. She is not ill-appearing.  HENT:     Head: Normocephalic and atraumatic.  Chest:     Chest wall: No deformity, swelling or tenderness.  Breasts:     Right: Tenderness (Mild, lateral aspect of breast-nonfocal) present. No swelling, inverted nipple, mass, nipple discharge, skin change, axillary adenopathy or supraclavicular adenopathy.     Left: Normal. No axillary adenopathy or supraclavicular adenopathy.    Lymphadenopathy:     Upper Body:     Right upper body: No supraclavicular, axillary or pectoral adenopathy.     Left upper body: No supraclavicular, axillary or pectoral adenopathy.  Neurological:     Mental Status: She is alert.       DG Chest 2 View CLINICAL DATA:  Right-sided chest pain  EXAM: CHEST - 2 VIEW  COMPARISON:  10/09/2012  FINDINGS: The heart size and mediastinal contours are within normal limits. Both lungs are clear. Disc degenerative disease of the thoracic spine.  IMPRESSION: No acute abnormality of the lungs.  Electronically Signed   By: Eddie Candle M.D.   On: 06/03/2020 19:51   Last mammogram on file  06/16/2015-negative.  No mammographic evidence of malignancy     Assessment & Plan:    See Problem List for Assessment and Plan of chronic medical problems.    This visit occurred during the SARS-CoV-2 public health emergency.  Safety protocols were in place, including screening questions prior to the visit, additional usage of staff PPE, and extensive cleaning of exam room while observing appropriate contact time as indicated for disinfecting solutions.

## 2020-08-18 NOTE — Telephone Encounter (Signed)
Hold ibuprofen.  Try meloxicam once daily - it is similar to ibuprofen, but hopefully will help a Towry more.  She should take it with food.     rx sent to [facility].

## 2020-08-18 NOTE — Telephone Encounter (Signed)
Spoke with patient today. 

## 2020-08-18 NOTE — Assessment & Plan Note (Signed)
Acute Started about 2 weeks ago She has a throbbing and achy-like pain in the upper part of her breast and lateral aspect of the breast.  The breast is nonfocal and covers a wide area No palpable masses ?  Breast pain versus musculoskeletal-radiation of pain from shoulder or neck Start meloxicam 15 mg daily-hold ibuprofen Diagnostic mammogram and right breast ultrasound ordered

## 2020-08-18 NOTE — Patient Instructions (Addendum)
A diagnostic mammogram and breast ultrasound was ordered.  You can call the breast center to let them know.

## 2020-08-26 ENCOUNTER — Other Ambulatory Visit: Payer: Self-pay | Admitting: Internal Medicine

## 2020-08-28 ENCOUNTER — Ambulatory Visit: Payer: 59

## 2020-08-29 NOTE — Progress Notes (Signed)
Office Visit Note  Patient: Sylvia Conner             Date of Birth: 05-05-1955           MRN: [id number]             PCP: Binnie Rail, MD Referring: Binnie Rail, MD Visit Date: 09/12/2020 Occupation: @GUAROCC @  Subjective:  Pain in both shoulders.   History of Present Illness: Sylvia Conner is a 65 y.o. female seen in consultation per request of her PCP.  According the patient she had been active all of her life.  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  She retired in December 2021.  She states 3 months ago she started having pain and discomfort in her right shoulder joint.  She states that the pain usually gets worse in the evening.  She has difficulty raising her arm when the pain is worse otherwise she does well in between.  She states the pain radiates down into her right arm.  She has occasional discomfort in her left arm.  She also has occasional discomfort in her left knee joint.  She has not noticed any joint swelling.  She states she was seen by Dr. Quay Burow in March 2022 at the time the labs were performed and she was referred here for further evaluation.  Patient reports that in 2019 her right shoulder and arm was very painful at the time she was seen by a sports medicine doctor and had x-ray of her shoulder joint.  He gave her some exercises to do at home and the symptoms improved after 1 month.  She has been experiencing redness in her eyes since last year off and on.  She states she was given eyedrops in the past which helped.  She has an appointment coming up with an ophthalmologist next week.  No family history of autoimmune disease.  She is gravida 2, para 2, miscarriages 0.  No history of blood clots.  Activities of Daily Living:  Patient reports morning stiffness for 20-30  minutes.   Patient Reports nocturnal pain.  Difficulty dressing/grooming: Denies Difficulty climbing stairs: Denies Difficulty getting out of chair: Denies Difficulty using  hands for taps, buttons, cutlery, and/or writing: Denies  Review of Systems  Constitutional:  Positive for fatigue.  HENT:  Negative for mouth sores, mouth dryness and nose dryness.   Eyes:  Positive for pain, redness, itching and visual disturbance.  Respiratory:  Negative for shortness of breath and difficulty breathing.   Cardiovascular:  Negative for chest pain and palpitations.  Gastrointestinal:  Negative for blood in stool, constipation and diarrhea.  Endocrine: Negative for increased urination.  Genitourinary:  Negative for difficulty urinating.  Musculoskeletal:  Positive for joint pain, joint pain, myalgias, muscle weakness, morning stiffness, muscle tenderness and myalgias. Negative for joint swelling.  Skin:  Negative for color change, rash and redness.  Allergic/Immunologic: Negative for susceptible to infections.  Neurological:  Positive for numbness. Negative for dizziness, headaches, memory loss and weakness.  Hematological:  Negative for bruising/bleeding tendency.  Psychiatric/Behavioral:  Positive for sleep disturbance. Negative for depressed mood and confusion. The patient is not nervous/anxious.    PMFS History:  Patient Active Problem List   Diagnosis Date Noted   Breast pain, right 08/18/2020   Myalgia 06/03/2020   Arthralgia 06/03/2020   Right-sided chest pain 06/03/2020   Constipation 12/27/2019   Pruritus 08/08/2019   Episcleritis of left eye 06/21/2019  Hyperlipidemia 11/30/2018   Lipoma of arm 05/03/2017   Eczema, feet 12/24/2016   Left lower quadrant pain 05/04/2016   Right shoulder pain 10/31/2015   Adnexal mass 10/31/2015   HTN (hypertension) 05/24/2011   DM (diabetes mellitus) ([place]) 05/24/2011   Hypothyroidism 05/24/2011   History of hysterectomy 05/24/2011    Past Medical History:  Diagnosis Date   Anemia    Arthritis    Diabetes mellitus    Hypertension    Thyroid disease    UTI (lower urinary tract infection)     Family History   Problem Relation Age of Onset   Hypertension Mother    Diabetes Mother    Diabetes Sister    Diabetes Paternal Grandmother    Diabetes Other    Healthy Daughter    Healthy Daughter    Past Surgical History:  Procedure Laterality Date   CESAREAN SECTION     COLONOSCOPY     PARTIAL HYSTERECTOMY     1995   Social History   Social History Narrative   Not on file   Immunization History  Administered Date(s) Administered   Influenza,inj,Quad PF,6+ Mos 12/24/2016   Influenza-Unspecified 01/11/2015   PFIZER(Purple Top)SARS-COV-2 Vaccination 08/09/2019, 08/30/2019, 03/06/2020   Pneumococcal-Unspecified 02/09/2013   Td 06/03/2000   Tdap 04/30/2014, 08/31/2016     Objective: Vital Signs: BP 135/84 (BP Location: Left Arm, Patient Position: Sitting, Cuff Size: Normal)   Pulse 78   Resp 14   Ht 5' 2.5" (1.588 m)   Wt 159 lb (72.1 kg)   BMI 28.62 kg/m    Physical Exam Vitals and nursing note reviewed.  Constitutional:      Appearance: She is well-developed.  HENT:     Head: Normocephalic and atraumatic.  Eyes:     Conjunctiva/sclera: Conjunctivae normal.  Cardiovascular:     Rate and Rhythm: Normal rate and regular rhythm.     Heart sounds: Normal heart sounds.  Pulmonary:     Effort: Pulmonary effort is normal.     Breath sounds: Normal breath sounds.  Abdominal:     General: Bowel sounds are normal.     Palpations: Abdomen is soft.  Musculoskeletal:     Cervical back: Normal range of motion.  Lymphadenopathy:     Cervical: No cervical adenopathy.  Skin:    General: Skin is warm and dry.     Capillary Refill: Capillary refill takes less than 2 seconds.  Neurological:     Mental Status: She is alert and oriented to person, place, and time.  Psychiatric:        Behavior: Behavior normal.     Musculoskeletal Exam: She has good range of motion of her cervical thoracic and lumbar spine without discomfort.  She had discomfort with forward flexion of her right  shoulder over the anterior aspect.  There was no discomfort with internal and external rotation.  She had no discomfort range of motion of her left shoulder joint today.  Elbow joints, wrist joints, MCPs PIPs and DIPs with good range of motion.  She had bilateral CMC PIP and DIP thickening consistent with osteoarthritis.  Hip joints, knee joints, ankles, MTPs and PIPs with good range of motion with no synovitis.  CDAI Exam: CDAI Score: -- Patient Global: --; Provider Global: -- Swollen: --; Tender: -- Joint Exam 09/12/2020   No joint exam has been documented for this visit   There is currently no information documented on the homunculus. Go to the Rheumatology activity and complete the homunculus  joint exam.  Investigation: No additional findings.  Imaging: No results found.  Recent Labs: Lab Results  Component Value Date   WBC 4.7 06/03/2020   HGB 12.3 06/03/2020   PLT 310.0 06/03/2020   NA 134 (L) 06/03/2020   K 4.0 06/03/2020   CL 97 06/03/2020   CO2 30 06/03/2020   GLUCOSE 161 (H) 06/03/2020   BUN 14 06/03/2020   CREATININE 0.78 06/03/2020   BILITOT 0.8 06/03/2020   ALKPHOS 106 06/03/2020   AST 22 06/03/2020   ALT 18 06/03/2020   PROT 8.2 06/03/2020   ALBUMIN 4.3 06/03/2020   CALCIUM 9.7 06/03/2020   GFRAA >60 04/22/2016    Speciality Comments: No specialty comments available.  Procedures:  No procedures performed Allergies: Penicillins, Glipizide, Januvia [sitagliptin], Sulfa antibiotics, and Scallops [shellfish allergy]   Assessment / Plan:     Visit Diagnoses: Chronic pain of both shoulders -she complains of pain and discomfort in her bilateral shoulders off and on.  The right shoulder has been more prominent and persistent.  She had similar symptoms in 2019 at that time she had x-rays done by sports medicine which showed osteoarthritic changes.  She also had an ultrasound of her shoulder which showed chronic rotator cuff tear of the supraspinatus.  Her pain  today is mostly anterior.  Plan: XR Shoulder Right.  X-ray showed osteoarthritic changes in the glenohumeral and acromioclavicular joint. X-ray findings were discussed with the patient. I will refer her to physical therapy for right shoulder joint tendinopathy.  Have given her a handout on shoulder exercises.  Primary osteoarthritis of both hands-she has bilateral CMC PIP and DIP thickening consistent with osteoarthritis.  Joint protection was discussed.  Chronic pain of left knee -she has been experiencing pain and discomfort in her left knee joint.  No warmth swelling or effusion was noted.  Plan: XR KNEE 3 VIEW LEFT, x-ray showed moderate osteoarthritis and moderate chondromalacia patella.  X-ray findings were discussed with the patient.  Uric acid.  Have given her a handout on knee joint exercises.  Myalgia -she complains of discomfort in right arm over the medial aspect.- Plan: CK  Lipoma of right upper extremity -she has a large lipoma over the lateral aspect of her right arm.  She is scheduled to have surgery for lipoma resection next month.  Positive ANA (antinuclear antibody) - 06/03/20: ANA 1:320NH, CRP<1, RF<14, ESR 52, TSH 2.70 -there is no history of oral ulcers, nasal ulcers, malar rash, photosensitivity, sicca symptoms, inflammatory arthritis.  There is no family history of autoimmune disease.  To complete the work-up I will obtain following labs.  Plan: ANA, Anti-scleroderma antibody, RNP Antibody, Anti-Smith antibody, Sjogrens syndrome-A extractable nuclear antibody, Sjogrens syndrome-B extractable nuclear antibody, Anti-DNA antibody, double-stranded, C3 and C4, Sedimentation rate  Elevated sed rate-  DDD (degenerative disc disease), cervical-I reviewed some referral cervical spine x-rays which were consistent with degenerative changes.  DDD (degenerative disc disease), lumbar-the lumbar spine x-rays from 2014 also showed degenerative changes.  She had good mobility without any  discomfort.  Eye redness -she gives history of intermittent eye redness since 2021.  She states she has been having blurry vision and itching in her eyes.  She has an appointment coming up with the ophthalmologist.    Type 2 diabetes mellitus with hyperglycemia, with long-term current use of insulin ([place])  Primary hypertension-blood pressure is well controlled today.  History of hyperlipidemia  Acquired hypothyroidism  Eczema, unspecified type  Orders: Orders Placed This Encounter  Procedures  XR KNEE 3 VIEW LEFT   XR Shoulder Right   CK   ANA   Anti-scleroderma antibody   RNP Antibody   Anti-Smith antibody   Sjogrens syndrome-A extractable nuclear antibody   Sjogrens syndrome-B extractable nuclear antibody   Anti-DNA antibody, double-stranded   C3 and C4   Sedimentation rate   Uric acid    No orders of the defined types were placed in this encounter.    Follow-Up Instructions: Return for Pain in joints.   Bo Merino, MD  Note - This record has been created using Editor, commissioning.  Chart creation errors have been sought, but may not always  have been located. Such creation errors do not reflect on  the standard of medical care.

## 2020-09-04 ENCOUNTER — Ambulatory Visit: Payer: 59 | Admitting: Rheumatology

## 2020-09-12 ENCOUNTER — Ambulatory Visit: Payer: Self-pay

## 2020-09-12 ENCOUNTER — Ambulatory Visit (INDEPENDENT_AMBULATORY_CARE_PROVIDER_SITE_OTHER): Payer: 59 | Admitting: Rheumatology

## 2020-09-12 ENCOUNTER — Other Ambulatory Visit: Payer: Self-pay

## 2020-09-12 ENCOUNTER — Encounter: Payer: Self-pay | Admitting: Rheumatology

## 2020-09-12 VITALS — BP 135/84 | HR 78 | Resp 14 | Ht 62.5 in | Wt 159.0 lb

## 2020-09-12 DIAGNOSIS — E039 Hypothyroidism, unspecified: Secondary | ICD-10-CM

## 2020-09-12 DIAGNOSIS — H5789 Other specified disorders of eye and adnexa: Secondary | ICD-10-CM

## 2020-09-12 DIAGNOSIS — M51369 Other intervertebral disc degeneration, lumbar region without mention of lumbar back pain or lower extremity pain: Secondary | ICD-10-CM

## 2020-09-12 DIAGNOSIS — M25512 Pain in left shoulder: Secondary | ICD-10-CM

## 2020-09-12 DIAGNOSIS — D1721 Benign lipomatous neoplasm of skin and subcutaneous tissue of right arm: Secondary | ICD-10-CM

## 2020-09-12 DIAGNOSIS — L309 Dermatitis, unspecified: Secondary | ICD-10-CM

## 2020-09-12 DIAGNOSIS — G8929 Other chronic pain: Secondary | ICD-10-CM

## 2020-09-12 DIAGNOSIS — M25562 Pain in left knee: Secondary | ICD-10-CM

## 2020-09-12 DIAGNOSIS — M19042 Primary osteoarthritis, left hand: Secondary | ICD-10-CM

## 2020-09-12 DIAGNOSIS — M25511 Pain in right shoulder: Secondary | ICD-10-CM

## 2020-09-12 DIAGNOSIS — M791 Myalgia, unspecified site: Secondary | ICD-10-CM

## 2020-09-12 DIAGNOSIS — Z90710 Acquired absence of both cervix and uterus: Secondary | ICD-10-CM

## 2020-09-12 DIAGNOSIS — Z794 Long term (current) use of insulin: Secondary | ICD-10-CM

## 2020-09-12 DIAGNOSIS — R768 Other specified abnormal immunological findings in serum: Secondary | ICD-10-CM

## 2020-09-12 DIAGNOSIS — M5136 Other intervertebral disc degeneration, lumbar region: Secondary | ICD-10-CM

## 2020-09-12 DIAGNOSIS — H15102 Unspecified episcleritis, left eye: Secondary | ICD-10-CM

## 2020-09-12 DIAGNOSIS — M19041 Primary osteoarthritis, right hand: Secondary | ICD-10-CM | POA: Diagnosis not present

## 2020-09-12 DIAGNOSIS — M5030 Other cervical disc degeneration, unspecified cervical region: Secondary | ICD-10-CM

## 2020-09-12 DIAGNOSIS — I10 Essential (primary) hypertension: Secondary | ICD-10-CM

## 2020-09-12 DIAGNOSIS — R7689 Other specified abnormal immunological findings in serum: Secondary | ICD-10-CM

## 2020-09-12 DIAGNOSIS — M2550 Pain in unspecified joint: Secondary | ICD-10-CM

## 2020-09-12 DIAGNOSIS — E1165 Type 2 diabetes mellitus with hyperglycemia: Secondary | ICD-10-CM

## 2020-09-12 DIAGNOSIS — Z8639 Personal history of other endocrine, nutritional and metabolic disease: Secondary | ICD-10-CM

## 2020-09-12 DIAGNOSIS — R700 Elevated erythrocyte sedimentation rate: Secondary | ICD-10-CM

## 2020-09-12 NOTE — Patient Instructions (Signed)
Journal for Nurse Practitioners, 15(4), 263-267. Retrieved January 09, 2018 from [email or web address].[email or web address]">  Knee Exercises Ask your health care provider which exercises are safe for you. Do exercises exactly as told by your health care provider and adjust them as directed. It is normal to feel mild stretching, pulling, tightness, or discomfort as you do these exercises. Stop right away if you feel sudden pain or your pain gets worse. Do not begin these exercises until told by your health care provider. Stretching and range-of-motion exercises These exercises warm up your muscles and joints and improve the movement and flexibility of your knee. These exercises also help to relieve pain andswelling. Knee extension, prone Lie on your abdomen (prone position) on a bed. Place your left / right knee just beyond the edge of the surface so your knee is not on the bed. You can put a towel under your left / right thigh just above your kneecap for comfort. Relax your leg muscles and allow gravity to straighten your knee (extension). You should feel a stretch behind your left / right knee. Hold this position for __________ seconds. Scoot up so your knee is supported between repetitions. Repeat __________ times. Complete this exercise __________ times a day. Knee flexion, active  Lie on your back with both legs straight. If this causes back discomfort, bend your left / right knee so your foot is flat on the floor. Slowly slide your left / right heel back toward your buttocks. Stop when you feel a gentle stretch in the front of your knee or thigh (flexion). Hold this position for __________ seconds. Slowly slide your left / right heel back to the starting position. Repeat __________ times. Complete this exercise __________ times a day. Quadriceps stretch, prone  Lie on your abdomen on a firm surface, such as a bed or padded floor. Bend your left / right knee and hold your ankle. If you cannot reach  your ankle or pant leg, loop a belt around your foot and grab the belt instead. Gently pull your heel toward your buttocks. Your knee should not slide out to the side. You should feel a stretch in the front of your thigh and knee (quadriceps). Hold this position for __________ seconds. Repeat __________ times. Complete this exercise __________ times a day. Hamstring, supine Lie on your back (supine position). Loop a belt or towel over the ball of your left / right foot. The ball of your foot is on the walking surface, right under your toes. Straighten your left / right knee and slowly pull on the belt to raise your leg until you feel a gentle stretch behind your knee (hamstring). Do not let your knee bend while you do this. Keep your other leg flat on the floor. Hold this position for __________ seconds. Repeat __________ times. Complete this exercise __________ times a day. Strengthening exercises These exercises build strength and endurance in your knee. Endurance is theability to use your muscles for a long time, even after they get tired. Quadriceps, isometric This exercise stretches the muscles in front of your thigh (quadriceps) without moving your knee joint (isometric). Lie on your back with your left / right leg extended and your other knee bent. Put a rolled towel or small pillow under your knee if told by your health care provider. Slowly tense the muscles in the front of your left / right thigh. You should see your kneecap slide up toward your hip or see increased dimpling just above the knee. This motion will   push the back of the knee toward the floor. For __________ seconds, hold the muscle as tight as you can without increasing your pain. Relax the muscles slowly and completely. Repeat __________ times. Complete this exercise __________ times a day. Straight leg raises This exercise stretches the muscles in front of your thigh (quadriceps) and the muscles that move your hips (hip  flexors). Lie on your back with your left / right leg extended and your other knee bent. Tense the muscles in the front of your left / right thigh. You should see your kneecap slide up or see increased dimpling just above the knee. Your thigh may even shake a bit. Keep these muscles tight as you raise your leg 4-6 inches (10-15 cm) off the floor. Do not let your knee bend. Hold this position for __________ seconds. Keep these muscles tense as you lower your leg. Relax your muscles slowly and completely after each repetition. Repeat __________ times. Complete this exercise __________ times a day. Hamstring, isometric Lie on your back on a firm surface. Bend your left / right knee about __________ degrees. Dig your left / right heel into the surface as if you are trying to pull it toward your buttocks. Tighten the muscles in the back of your thighs (hamstring) to "dig" as hard as you can without increasing any pain. Hold this position for __________ seconds. Release the tension gradually and allow your muscles to relax completely for __________ seconds after each repetition. Repeat __________ times. Complete this exercise __________ times a day. Hamstring curls If told by your health care provider, do this exercise while wearing ankle weights. Begin with __________ lb weights. Then increase the weight by 1 lb (0.5 kg) increments. Do not wear ankle weights that are more than __________ lb. Lie on your abdomen with your legs straight. Bend your left / right knee as far as you can without feeling pain. Keep your hips flat against the floor. Hold this position for __________ seconds. Slowly lower your leg to the starting position. Repeat __________ times. Complete this exercise __________ times a day. Squats This exercise strengthens the muscles in front of your thigh and knee (quadriceps). Stand in front of a table, with your feet and knees pointing straight ahead. You may rest your hands on the  table for balance but not for support. Slowly bend your knees and lower your hips like you are going to sit in a chair. Keep your weight over your heels, not over your toes. Keep your lower legs upright so they are parallel with the table legs. Do not let your hips go lower than your knees. Do not bend lower than told by your health care provider. If your knee pain increases, do not bend as low. Hold the squat position for __________ seconds. Slowly push with your legs to return to standing. Do not use your hands to pull yourself to standing. Repeat __________ times. Complete this exercise __________ times a day. Wall slides This exercise strengthens the muscles in front of your thigh and knee (quadriceps). Lean your back against a smooth wall or door, and walk your feet out 18-24 inches (46-61 cm) from it. Place your feet hip-width apart. Slowly slide down the wall or door until your knees bend __________ degrees. Keep your knees over your heels, not over your toes. Keep your knees in line with your hips. Hold this position for __________ seconds. Repeat __________ times. Complete this exercise __________ times a day. Straight leg raises This exercise   strengthens the muscles that rotate the leg at the hip and move it away from your body (hip abductors). Lie on your side with your left / right leg in the top position. Lie so your head, shoulder, knee, and hip line up. You may bend your bottom knee to help you keep your balance. Roll your hips slightly forward so your hips are stacked directly over each other and your left / right knee is facing forward. Leading with your heel, lift your top leg 4-6 inches (10-15 cm). You should feel the muscles in your outer hip lifting. Do not let your foot drift forward. Do not let your knee roll toward the ceiling. Hold this position for __________ seconds. Slowly return your leg to the starting position. Let your muscles relax completely after each  repetition. Repeat __________ times. Complete this exercise __________ times a day. Straight leg raises This exercise stretches the muscles that move your hips away from the front of the pelvis (hip extensors). Lie on your abdomen on a firm surface. You can put a pillow under your hips if that is more comfortable. Tense the muscles in your buttocks and lift your left / right leg about 4-6 inches (10-15 cm). Keep your knee straight as you lift your leg. Hold this position for __________ seconds. Slowly lower your leg to the starting position. Let your leg relax completely after each repetition. Repeat __________ times. Complete this exercise __________ times a day. This information is not intended to replace advice given to you by your health care provider. Make sure you discuss any questions you have with your healthcare provider. Document Revised: 01/10/2018 Document Reviewed: 01/10/2018 Elsevier Patient Education  2022 [place]. Shoulder Exercises Ask your health care provider which exercises are safe for you. Do exercises exactly as told by your health care provider and adjust them as directed. It is normal to feel mild stretching, pulling, tightness, or discomfort as you do these exercises. Stop right away if you feel sudden pain or your pain gets worse. Do not begin these exercises until told by your health care provider. Stretching exercises External rotation and abduction This exercise is sometimes called corner stretch. This exercise rotates your arm outward (external rotation) and moves your arm out from your body (abduction). Stand in a doorway with one of your feet slightly in front of the other. This is called a staggered stance. If you cannot reach your forearms to the door frame, stand facing a corner of a room. Choose one of the following positions as told by your health care provider: Place your hands and forearms on the door frame above your head. Place your hands and  forearms on the door frame at the height of your head. Place your hands on the door frame at the height of your elbows. Slowly move your weight onto your front foot until you feel a stretch across your chest and in the front of your shoulders. Keep your head and chest upright and keep your abdominal muscles tight. Hold for __________ seconds. To release the stretch, shift your weight to your back foot. Repeat __________ times. Complete this exercise __________ times a day. Extension, standing Stand and hold a broomstick, a cane, or a similar object behind your back. Your hands should be a [place] wider than shoulder width apart. Your palms should face away from your back. Keeping your elbows straight and your shoulder muscles relaxed, move the stick away from your body until you feel a stretch in your  shoulders (extension). Avoid shrugging your shoulders while you move the stick. Keep your shoulder blades tucked down toward the middle of your back. Hold for __________ seconds. Slowly return to the starting position. Repeat __________ times. Complete this exercise __________ times a day. Range-of-motion exercises Pendulum  Stand near a wall or a surface that you can hold onto for balance. Bend at the waist and let your left / right arm hang straight down. Use your other arm to support you. Keep your back straight and do not lock your knees. Relax your left / right arm and shoulder muscles, and move your hips and your trunk so your left / right arm swings freely. Your arm should swing because of the motion of your body, not because you are using your arm or shoulder muscles. Keep moving your hips and trunk so your arm swings in the following directions, as told by your health care provider: Side to side. Forward and backward. In clockwise and counterclockwise circles. Continue each motion for __________ seconds, or for as long as told by your health care provider. Slowly return to the starting  position. Repeat __________ times. Complete this exercise __________ times a day. Shoulder flexion, standing  Stand and hold a broomstick, a cane, or a similar object. Place your hands a [place] more than shoulder width apart on the object. Your left / right hand should be palm up, and your other hand should be palm down. Keep your elbow straight and your shoulder muscles relaxed. Push the stick up with your healthy arm to raise your left / right arm in front of your body, and then over your head until you feel a stretch in your shoulder (flexion). Avoid shrugging your shoulder while you raise your arm. Keep your shoulder blade tucked down toward the middle of your back. Hold for __________ seconds. Slowly return to the starting position. Repeat __________ times. Complete this exercise __________ times a day. Shoulder abduction, standing Stand and hold a broomstick, a cane, or a similar object. Place your hands a [place] more than shoulder width apart on the object. Your left / right hand should be palm up, and your other hand should be palm down. Keep your elbow straight and your shoulder muscles relaxed. Push the object across your body toward your left / right side. Raise your left / right arm to the side of your body (abduction) until you feel a stretch in your shoulder. Do not raise your arm above shoulder height unless your health care provider tells you to do that. If directed, raise your arm over your head. Avoid shrugging your shoulder while you raise your arm. Keep your shoulder blade tucked down toward the middle of your back. Hold for __________ seconds. Slowly return to the starting position. Repeat __________ times. Complete this exercise __________ times a day. Internal rotation  Place your left / right hand behind your back, palm up. Use your other hand to dangle an exercise band, a towel, or a similar object over your shoulder. Grasp the band with your left / right hand so you  are holding on to both ends. Gently pull up on the band until you feel a stretch in the front of your left / right shoulder. The movement of your arm toward the center of your body is called internal rotation. Avoid shrugging your shoulder while you raise your arm. Keep your shoulder blade tucked down toward the middle of your back. Hold for __________ seconds. Release the stretch by letting  go of the band and lowering your hands. Repeat __________ times. Complete this exercise __________ times a day. Strengthening exercises External rotation  Sit in a stable chair without armrests. Secure an exercise band to a stable object at elbow height on your left / right side. Place a soft object, such as a folded towel or a small pillow, between your left / right upper arm and your body to move your elbow about 4 inches (10 cm) away from your side. Hold the end of the exercise band so it is tight and there is no slack. Keeping your elbow pressed against the soft object, slowly move your forearm out, away from your abdomen (external rotation). Keep your body steady so only your forearm moves. Hold for __________ seconds. Slowly return to the starting position. Repeat __________ times. Complete this exercise __________ times a day. Shoulder abduction  Sit in a stable chair without armrests, or stand up. Hold a __________ weight in your left / right hand, or hold an exercise band with both hands. Start with your arms straight down and your left / right palm facing in, toward your body. Slowly lift your left / right hand out to your side (abduction). Do not lift your hand above shoulder height unless your health care provider tells you that this is safe. Keep your arms straight. Avoid shrugging your shoulder while you do this movement. Keep your shoulder blade tucked down toward the middle of your back. Hold for __________ seconds. Slowly lower your arm, and return to the starting position. Repeat  __________ times. Complete this exercise __________ times a day. Shoulder extension Sit in a stable chair without armrests, or stand up. Secure an exercise band to a stable object in front of you so it is at shoulder height. Hold one end of the exercise band in each hand. Your palms should face each other. Straighten your elbows and lift your hands up to shoulder height. Step back, away from the secured end of the exercise band, until the band is tight and there is no slack. Squeeze your shoulder blades together as you pull your hands down to the sides of your thighs (extension). Stop when your hands are straight down by your sides. Do not let your hands go behind your body. Hold for __________ seconds. Slowly return to the starting position. Repeat __________ times. Complete this exercise __________ times a day. Shoulder row Sit in a stable chair without armrests, or stand up. Secure an exercise band to a stable object in front of you so it is at waist height. Hold one end of the exercise band in each hand. Position your palms so that your thumbs are facing the ceiling (neutral position). Bend each of your elbows to a 90-degree angle (right angle) and keep your upper arms at your sides. Step back until the band is tight and there is no slack. Slowly pull your elbows back behind you. Hold for __________ seconds. Slowly return to the starting position. Repeat __________ times. Complete this exercise __________ times a day. Shoulder press-ups  Sit in a stable chair that has armrests. Sit upright, with your feet flat on the floor. Put your hands on the armrests so your elbows are bent and your fingers are pointing forward. Your hands should be about even with the sides of your body. Push down on the armrests and use your arms to lift yourself off the chair. Straighten your elbows and lift yourself up as much as you comfortably can. Move  your shoulder blades down, and avoid letting your  shoulders move up toward your ears. Keep your feet on the ground. As you get stronger, your feet should support less of your body weight as you lift yourself up. Hold for __________ seconds. Slowly lower yourself back into the chair. Repeat __________ times. Complete this exercise __________ times a day. Wall push-ups  Stand so you are facing a stable wall. Your feet should be about one arm-length away from the wall. Lean forward and place your palms on the wall at shoulder height. Keep your feet flat on the floor as you bend your elbows and lean forward toward the wall. Hold for __________ seconds. Straighten your elbows to push yourself back to the starting position. Repeat __________ times. Complete this exercise __________ times a day. This information is not intended to replace advice given to you by your health care provider. Make sure you discuss any questions you have with your healthcare provider. Document Revised: 07/14/2018 Document Reviewed: 04/21/2018 Elsevier Patient Education  [place].

## 2020-09-15 ENCOUNTER — Telehealth: Payer: Self-pay | Admitting: Rheumatology

## 2020-09-15 LAB — URIC ACID: Uric Acid, Serum: 3.9 mg/dL (ref 2.5–7.0)

## 2020-09-15 LAB — ANTI-NUCLEAR AB-TITER (ANA TITER): ANA Titer 1: 1:80 {titer} — ABNORMAL HIGH

## 2020-09-15 LAB — ANTI-DNA ANTIBODY, DOUBLE-STRANDED: ds DNA Ab: <1 IU/mL

## 2020-09-15 LAB — C3 AND C4
C3 Complement: 135 mg/dL (ref 83–193)
C4 Complement: 44 mg/dL (ref 15–57)

## 2020-09-15 LAB — SJOGRENS SYNDROME-A EXTRACTABLE NUCLEAR ANTIBODY: SSA (Ro) (ENA) Antibody, IgG: <1 AI

## 2020-09-15 LAB — CK: Total CK: 124 U/L (ref 29–143)

## 2020-09-15 LAB — SJOGRENS SYNDROME-B EXTRACTABLE NUCLEAR ANTIBODY: SSB (La) (ENA) Antibody, IgG: <1 AI

## 2020-09-15 LAB — ANTI-SMITH ANTIBODY: ENA SM Ab Ser-aCnc: <1 AI

## 2020-09-15 LAB — SEDIMENTATION RATE: Sed Rate: 9 mm/h (ref 0–30)

## 2020-09-15 LAB — ANTI-SCLERODERMA ANTIBODY: Scleroderma (Scl-70) (ENA) Antibody, IgG: <1 AI

## 2020-09-15 LAB — RNP ANTIBODY: Ribonucleic Protein(ENA) Antibody, IgG: <1 AI

## 2020-09-15 LAB — ANA: Anti Nuclear Antibody (ANA): POSITIVE — AB

## 2020-09-15 NOTE — Progress Notes (Signed)
ANA titer is lower.  All other autoimmune labs are negative.  I will discuss results at the follow-up visit.

## 2020-09-15 NOTE — Telephone Encounter (Signed)
Patient calling to let you know she has started to experience weakness. This started yesterday, and comes and goes through out the day. Patient noticed lab results coming thru My Chart, and thought this new symptom could be related . Please call to discuss.

## 2020-09-16 NOTE — Telephone Encounter (Signed)
All the labs are unremarkable except for low titer ANA which is not a significant value.  She may discuss fatigue with her PCP.  I will discuss results at the follow-up visit.

## 2020-09-16 NOTE — Telephone Encounter (Signed)
Contacted the patient and advised all the labs are unremarkable except for low titer ANA which is not a significant value.  She may discuss fatigue with her PCP.  I will discuss results at the follow-up visit.

## 2020-09-18 ENCOUNTER — Other Ambulatory Visit: Payer: 59

## 2020-09-19 ENCOUNTER — Ambulatory Visit
Admission: RE | Admit: 2020-09-19 | Discharge: 2020-09-19 | Disposition: A | Discharge status: Home / Self Care | Payer: 59 | Source: Ambulatory Visit | Attending: Internal Medicine | Admitting: Internal Medicine

## 2020-09-19 ENCOUNTER — Other Ambulatory Visit: Payer: Self-pay

## 2020-09-19 DIAGNOSIS — N644 Mastodynia: Secondary | ICD-10-CM

## 2020-09-25 ENCOUNTER — Other Ambulatory Visit: Payer: Self-pay | Admitting: Internal Medicine

## 2020-10-21 ENCOUNTER — Ambulatory Visit: Payer: 59 | Admitting: Rheumatology

## 2020-10-26 NOTE — Progress Notes (Deleted)
Office Visit Note  Patient: Sylvia Conner             Date of Birth: 22-Jul-1955           MRN: [id number]             PCP: Binnie Rail, MD Referring: Binnie Rail, MD Visit Date: 10/31/2020 Occupation: @GUAROCC @  Subjective:  No chief complaint on file.   History of Present Illness: Sylvia Conner is a 65 y.o. female ***   Activities of Daily Living:  Patient reports morning stiffness for *** {minute/hour:19697}.   Patient {ACTIONS;DENIES/REPORTS:21021675::"Denies"} nocturnal pain.  Difficulty dressing/grooming: {ACTIONS;DENIES/REPORTS:21021675::"Denies"} Difficulty climbing stairs: {ACTIONS;DENIES/REPORTS:21021675::"Denies"} Difficulty getting out of chair: {ACTIONS;DENIES/REPORTS:21021675::"Denies"} Difficulty using hands for taps, buttons, cutlery, and/or writing: {ACTIONS;DENIES/REPORTS:21021675::"Denies"}  No Rheumatology ROS completed.   PMFS History:  Patient Active Problem List   Diagnosis Date Noted   Breast pain, right 08/18/2020   Myalgia 06/03/2020   Arthralgia 06/03/2020   Right-sided chest pain 06/03/2020   Constipation 12/27/2019   Pruritus 08/08/2019   Episcleritis of left eye 06/21/2019   Hyperlipidemia 11/30/2018   Lipoma of arm 05/03/2017   Eczema, feet 12/24/2016   Left lower quadrant pain 05/04/2016   Right shoulder pain 10/31/2015   Adnexal mass 10/31/2015   HTN (hypertension) 05/24/2011   DM (diabetes mellitus) ([place]) 05/24/2011   Hypothyroidism 05/24/2011   History of hysterectomy 05/24/2011    Past Medical History:  Diagnosis Date   Anemia    Arthritis    Diabetes mellitus    Hypertension    Thyroid disease    UTI (lower urinary tract infection)     Family History  Problem Relation Age of Onset   Hypertension Mother    Diabetes Mother    Diabetes Sister    Diabetes Paternal Grandmother    Diabetes Other    Healthy Daughter    Healthy Daughter    Past Surgical History:  Procedure Laterality Date   [place]   Social History   Social History Narrative   Not on file   Immunization History  Administered Date(s) Administered   Influenza,inj,Quad PF,6+ Mos 12/24/2016   Influenza-Unspecified 01/11/2015   PFIZER(Purple Top)SARS-COV-2 Vaccination 08/09/2019, 08/30/2019, 03/06/2020   Pneumococcal-Unspecified 02/09/2013   Td 06/03/2000   Tdap 04/30/2014, 08/31/2016     Objective: Vital Signs: There were no vitals taken for this visit.   Physical Exam   Musculoskeletal Exam: ***  CDAI Exam: CDAI Score: -- Patient Global: --; Provider Global: -- Swollen: --; Tender: -- Joint Exam 10/31/2020   No joint exam has been documented for this visit   There is currently no information documented on the homunculus. Go to the Rheumatology activity and complete the homunculus joint exam.  Investigation: No additional findings.  Imaging: No results found.  Recent Labs: Lab Results  Component Value Date   WBC 4.7 06/03/2020   HGB 12.3 06/03/2020   PLT 310.0 06/03/2020   NA 134 (L) 06/03/2020   K 4.0 06/03/2020   CL 97 06/03/2020   CO2 30 06/03/2020   GLUCOSE 161 (H) 06/03/2020   BUN 14 06/03/2020   CREATININE 0.78 06/03/2020   BILITOT 0.8 06/03/2020   ALKPHOS 106 06/03/2020   AST 22 06/03/2020   ALT 18 06/03/2020   PROT 8.2 06/03/2020   ALBUMIN 4.3 06/03/2020   CALCIUM 9.7 06/03/2020   GFRAA >60 04/22/2016   September 12, 2020 ANA  1: 80NH, ENA negative, C3-C4 normal, ESR 9, uric acid 3.9, CK124   Speciality Comments: No specialty comments available.  Procedures:  No procedures performed Allergies: Penicillins, Glipizide, Januvia [sitagliptin], Sulfa antibiotics, and Scallops [shellfish allergy]   Assessment / Plan:     Visit Diagnoses: No diagnosis found.  Orders: No orders of the defined types were placed in this encounter.  No orders of the defined types were placed in this encounter.   Face-to-face time spent with  patient was *** minutes. Greater than 50% of time was spent in counseling and coordination of care.  Follow-Up Instructions: No follow-ups on file.   Bo Merino, MD  Note - This record has been created using Editor, commissioning.  Chart creation errors have been sought, but may not always  have been located. Such creation errors do not reflect on  the standard of medical care.

## 2020-10-28 ENCOUNTER — Encounter (HOSPITAL_COMMUNITY): Payer: Self-pay

## 2020-10-28 ENCOUNTER — Other Ambulatory Visit: Payer: Self-pay

## 2020-10-28 ENCOUNTER — Telehealth: Payer: Self-pay | Admitting: Internal Medicine

## 2020-10-28 ENCOUNTER — Emergency Department (HOSPITAL_COMMUNITY): Payer: 59

## 2020-10-28 ENCOUNTER — Emergency Department (HOSPITAL_COMMUNITY)
Admission: EM | Admit: 2020-10-28 | Discharge: 2020-10-28 | Disposition: A | Discharge status: Home / Self Care | Payer: 59 | Attending: Emergency Medicine | Admitting: Emergency Medicine

## 2020-10-28 DIAGNOSIS — Z5321 Procedure and treatment not carried out due to patient leaving prior to being seen by health care provider: Secondary | ICD-10-CM | POA: Insufficient documentation

## 2020-10-28 DIAGNOSIS — R202 Paresthesia of skin: Secondary | ICD-10-CM | POA: Insufficient documentation

## 2020-10-28 DIAGNOSIS — R079 Chest pain, unspecified: Secondary | ICD-10-CM | POA: Diagnosis not present

## 2020-10-28 LAB — URINALYSIS, ROUTINE W REFLEX MICROSCOPIC
Bacteria, UA: NONE SEEN
Bilirubin Urine: NEGATIVE
Glucose, UA: NEGATIVE mg/dL
Hgb urine dipstick: NEGATIVE
Ketones, ur: NEGATIVE mg/dL
Nitrite: NEGATIVE
Protein, ur: NEGATIVE mg/dL
Specific Gravity, Urine: 1.02 (ref 1.005–1.030)
pH: 5 (ref 5.0–8.0)

## 2020-10-28 LAB — BASIC METABOLIC PANEL
Anion gap: 10 (ref 5–15)
BUN: 16 mg/dL (ref 8–23)
CO2: 29 mmol/L (ref 22–32)
Calcium: 9.3 mg/dL (ref 8.9–10.3)
Chloride: 97 mmol/L — ABNORMAL LOW (ref 98–111)
Creatinine, Ser: 0.94 mg/dL (ref 0.44–1.00)
GFR, Estimated: >60 mL/min (ref >60)
Glucose, Bld: 185 mg/dL — ABNORMAL HIGH (ref 70–99)
Potassium: 3.4 mmol/L — ABNORMAL LOW (ref 3.5–5.1)
Sodium: 136 mmol/L (ref 135–145)

## 2020-10-28 LAB — CBC
HCT: 39.4 % (ref 36.0–46.0)
Hemoglobin: 11.9 g/dL — ABNORMAL LOW (ref 12.0–15.0)
MCH: 23.4 pg — ABNORMAL LOW (ref 26.0–34.0)
MCHC: 30.2 g/dL (ref 30.0–36.0)
MCV: 77.4 fL — ABNORMAL LOW (ref 80.0–100.0)
Platelets: 325 10*3/uL (ref 150–400)
RBC: 5.09 MIL/uL (ref 3.87–5.11)
RDW: 13.2 % (ref 11.5–15.5)
WBC: 5 10*3/uL (ref 4.0–10.5)
nRBC: 0 % (ref 0.0–0.2)

## 2020-10-28 LAB — TROPONIN I (HIGH SENSITIVITY)
Troponin I (High Sensitivity): 5 ng/L (ref <18)
Troponin I (High Sensitivity): 3 ng/L (ref <18)

## 2020-10-28 NOTE — Telephone Encounter (Signed)
Severe left arm & chest pain started about 3 days ago  taking pain relievers but nothing is working  can not move left arm  Transferred to [place]

## 2020-10-28 NOTE — ED Notes (Signed)
Pt seen leaving the lobby to go home

## 2020-10-28 NOTE — ED Provider Notes (Signed)
Emergency Medicine Provider Triage Evaluation Note  Sylvia Conner , a 65 y.o. female  was evaluated in triage.  Pt complains of intermittent chest pain x4 days.  Pain is located middle of her chest and radiates to her left shoulder.  Patient states the pain is sharp.  Today she had some numbness in her left arm that caused her to become concerned and called EMS.  She was given aspirin prior to arrival.  Patient admits to intermittent diaphoresis and nausea as well.  Denies any currently.  No family history of cardiac disease.  Patient does not smoke..  Review of Systems  Positive: Chest pain, nausea, diaphoresis Negative: Fever, chills, vomiting, cough, shortness of breath  Physical Exam  BP 136/90 (BP Location: Right Arm)   Pulse 76   Temp 98.3 F (36.8 C) (Oral)   Resp 18   SpO2 97%  Gen:   Awake, no distress   Resp:  Normal effort  MSK:   Moves extremities without difficulty  Other:  No abdominal tenderness.  Medical Decision Making  Medically screening exam initiated at 1:49 PM.  Appropriate orders placed.  Laticha Demeritt was informed that the remainder of the evaluation will be completed by another provider, this initial triage assessment does not replace that evaluation, and the importance of remaining in the ED until their evaluation is complete.  Cardiac work up initiated.   Portions of this note were generated with Lobbyist. Dictation errors may occur despite best attempts at proofreading.    Barrie Folk, PA-C 10/28/20 1349    Lajean Saver, MD 10/30/20 1313

## 2020-10-28 NOTE — Telephone Encounter (Signed)
[facility] FYI:   --Caller states she is having severe pain in left arm and chest area that begin three days ago. Caller states she can not left her arm. Caller states pain is shooting to her back. Caller states she is taking aspirin and it is not helping. It hurts her to move or raise her arm. The pain is constant and sharp and goes up into the collarbone.   Advised to call EMS 911

## 2020-10-28 NOTE — ED Triage Notes (Signed)
Pt reports since Saturday she has been having on and off chest pain.pt reports each time her left arm went numb. Pt reports the numbness would like for about 30 mins.

## 2020-10-30 ENCOUNTER — Ambulatory Visit: Payer: 59 | Admitting: Rheumatology

## 2020-10-30 ENCOUNTER — Other Ambulatory Visit: Payer: Self-pay | Admitting: Internal Medicine

## 2020-10-31 ENCOUNTER — Ambulatory Visit: Payer: 59 | Admitting: Rheumatology

## 2020-10-31 DIAGNOSIS — M5136 Other intervertebral disc degeneration, lumbar region: Secondary | ICD-10-CM

## 2020-10-31 DIAGNOSIS — M1712 Unilateral primary osteoarthritis, left knee: Secondary | ICD-10-CM

## 2020-10-31 DIAGNOSIS — H5789 Other specified disorders of eye and adnexa: Secondary | ICD-10-CM

## 2020-10-31 DIAGNOSIS — L309 Dermatitis, unspecified: Secondary | ICD-10-CM

## 2020-10-31 DIAGNOSIS — M5030 Other cervical disc degeneration, unspecified cervical region: Secondary | ICD-10-CM

## 2020-10-31 DIAGNOSIS — I10 Essential (primary) hypertension: Secondary | ICD-10-CM

## 2020-10-31 DIAGNOSIS — E039 Hypothyroidism, unspecified: Secondary | ICD-10-CM

## 2020-10-31 DIAGNOSIS — R768 Other specified abnormal immunological findings in serum: Secondary | ICD-10-CM

## 2020-10-31 DIAGNOSIS — D1721 Benign lipomatous neoplasm of skin and subcutaneous tissue of right arm: Secondary | ICD-10-CM

## 2020-10-31 DIAGNOSIS — E1165 Type 2 diabetes mellitus with hyperglycemia: Secondary | ICD-10-CM

## 2020-10-31 DIAGNOSIS — G8929 Other chronic pain: Secondary | ICD-10-CM

## 2020-10-31 DIAGNOSIS — M791 Myalgia, unspecified site: Secondary | ICD-10-CM

## 2020-10-31 DIAGNOSIS — Z8639 Personal history of other endocrine, nutritional and metabolic disease: Secondary | ICD-10-CM

## 2020-10-31 DIAGNOSIS — M19041 Primary osteoarthritis, right hand: Secondary | ICD-10-CM

## 2020-11-26 ENCOUNTER — Ambulatory Visit: Payer: 59 | Admitting: Rheumatology

## 2020-12-28 NOTE — Progress Notes (Deleted)
Office Visit Note  Patient: Sylvia Conner             Date of Birth: [birth year]           MRN: [id number]             PCP: Binnie Rail, MD Referring: Binnie Rail, MD Visit Date: 01/02/2021 Occupation: @GUAROCC @  Subjective:  No chief complaint on file.   History of Present Illness: Cate Wrobleski is a 65 y.o. female ***   Activities of Daily Living:  Patient reports morning stiffness for *** {minute/hour:19697}.   Patient {ACTIONS;DENIES/REPORTS:21021675::"Denies"} nocturnal pain.  Difficulty dressing/grooming: {ACTIONS;DENIES/REPORTS:21021675::"Denies"} Difficulty climbing stairs: {ACTIONS;DENIES/REPORTS:21021675::"Denies"} Difficulty getting out of chair: {ACTIONS;DENIES/REPORTS:21021675::"Denies"} Difficulty using hands for taps, buttons, cutlery, and/or writing: {ACTIONS;DENIES/REPORTS:21021675::"Denies"}  No Rheumatology ROS completed.   PMFS History:  Patient Active Problem List   Diagnosis Date Noted  . Breast pain, right 08/18/2020  . Myalgia 06/03/2020  . Arthralgia 06/03/2020  . Right-sided chest pain 06/03/2020  . Constipation 12/27/2019  . Pruritus 08/08/2019  . Episcleritis of left eye 06/21/2019  . Hyperlipidemia 11/30/2018  . Lipoma of arm 05/03/2017  . Eczema, feet 12/24/2016  . Left lower quadrant pain 05/04/2016  . Right shoulder pain 10/31/2015  . Adnexal mass 10/31/2015  . HTN (hypertension) 05/24/2011  . DM (diabetes mellitus) ([place]) 05/24/2011  . Hypothyroidism 05/24/2011  . History of hysterectomy 05/24/2011    Past Medical History:  Diagnosis Date  . Anemia   . Arthritis   . Diabetes mellitus   . Hypertension   . Thyroid disease   . UTI (lower urinary tract infection)     Family History  Problem Relation Age of Onset  . Hypertension Mother   . Diabetes Mother   . Diabetes Sister   . Diabetes Paternal Grandmother   . Diabetes Other   . Healthy Daughter   . Healthy Daughter    Past Surgical History:  Procedure Laterality  Date  . CESAREAN SECTION    . COLONOSCOPY    . PARTIAL HYSTERECTOMY     1995   Social History   Social History Narrative  . Not on file   Immunization History  Administered Date(s) Administered  . Influenza,inj,Quad PF,6+ Mos 12/24/2016  . Influenza-Unspecified 01/11/2015  . PFIZER(Purple Top)SARS-COV-2 Vaccination 08/09/2019, 08/30/2019, 03/06/2020  . Pneumococcal-Unspecified 02/09/2013  . Td 06/03/2000  . Tdap 04/30/2014, 08/31/2016     Objective: Vital Signs: There were no vitals taken for this visit.   Physical Exam   Musculoskeletal Exam: ***  CDAI Exam: CDAI Score: -- Patient Global: --; Provider Global: -- Swollen: --; Tender: -- Joint Exam 01/02/2021   No joint exam has been documented for this visit   There is currently no information documented on the homunculus. Go to the Rheumatology activity and complete the homunculus joint exam.  Investigation: No additional findings.  Imaging: No results found.  Recent Labs: Lab Results  Component Value Date   WBC 5.0 10/28/2020   HGB 11.9 (L) 10/28/2020   PLT 325 10/28/2020   NA 136 10/28/2020   K 3.4 (L) 10/28/2020   CL 97 (L) 10/28/2020   CO2 29 10/28/2020   GLUCOSE 185 (H) 10/28/2020   BUN 16 10/28/2020   CREATININE 0.94 10/28/2020   BILITOT 0.8 06/03/2020   ALKPHOS 106 06/03/2020   AST 22 06/03/2020   ALT 18 06/03/2020   PROT 8.2 06/03/2020   ALBUMIN 4.3 06/03/2020   CALCIUM 9.3 10/28/2020   GFRAA >60 04/22/2016   September 12, 2020 ANA 1: 80NH, ENA negative, C3-C4 normal, ESR 9, uric acid 3.9, CX448  06/03/20: ANA 1:320NH, CRP<1, RF<14, ESR 52, TSH 2.70   Speciality Comments: No specialty comments available.  Procedures:  No procedures performed Allergies: Penicillins, Glipizide, Januvia [sitagliptin], Sulfa antibiotics, and Scallops [shellfish allergy]   Assessment / Plan:     Visit Diagnoses: No diagnosis found.  Orders: No orders of the defined types were placed in this encounter.  No  orders of the defined types were placed in this encounter.   Face-to-face time spent with patient was *** minutes. Greater than 50% of time was spent in counseling and coordination of care.  Follow-Up Instructions: No follow-ups on file.   Bo Merino, MD  Note - This record has been created using Editor, commissioning.  Chart creation errors have been sought, but may not always  have been located. Such creation errors do not reflect on  the standard of medical care.

## 2021-01-02 ENCOUNTER — Ambulatory Visit: Payer: 59 | Admitting: Rheumatology

## 2021-01-02 DIAGNOSIS — D1721 Benign lipomatous neoplasm of skin and subcutaneous tissue of right arm: Secondary | ICD-10-CM

## 2021-01-02 DIAGNOSIS — L309 Dermatitis, unspecified: Secondary | ICD-10-CM

## 2021-01-02 DIAGNOSIS — M5136 Other intervertebral disc degeneration, lumbar region: Secondary | ICD-10-CM

## 2021-01-02 DIAGNOSIS — E039 Hypothyroidism, unspecified: Secondary | ICD-10-CM

## 2021-01-02 DIAGNOSIS — I10 Essential (primary) hypertension: Secondary | ICD-10-CM

## 2021-01-02 DIAGNOSIS — M19041 Primary osteoarthritis, right hand: Secondary | ICD-10-CM

## 2021-01-02 DIAGNOSIS — R768 Other specified abnormal immunological findings in serum: Secondary | ICD-10-CM

## 2021-01-02 DIAGNOSIS — Z8639 Personal history of other endocrine, nutritional and metabolic disease: Secondary | ICD-10-CM

## 2021-01-02 DIAGNOSIS — E1165 Type 2 diabetes mellitus with hyperglycemia: Secondary | ICD-10-CM

## 2021-01-02 DIAGNOSIS — H5789 Other specified disorders of eye and adnexa: Secondary | ICD-10-CM

## 2021-01-02 DIAGNOSIS — G8929 Other chronic pain: Secondary | ICD-10-CM

## 2021-01-02 DIAGNOSIS — M5030 Other cervical disc degeneration, unspecified cervical region: Secondary | ICD-10-CM

## 2021-01-02 DIAGNOSIS — M1712 Unilateral primary osteoarthritis, left knee: Secondary | ICD-10-CM

## 2021-01-06 ENCOUNTER — Telehealth: Payer: Self-pay | Admitting: Internal Medicine

## 2021-01-06 DIAGNOSIS — R768 Other specified abnormal immunological findings in serum: Secondary | ICD-10-CM

## 2021-01-06 DIAGNOSIS — M2550 Pain in unspecified joint: Secondary | ICD-10-CM

## 2021-01-06 NOTE — Telephone Encounter (Signed)
Patient was dismissed from Dr. Estanislado Pandy office due to too many cancellations/no shows. Requesting a new referral for rheumatology.   Please advise.

## 2021-01-06 NOTE — Telephone Encounter (Signed)
Referral ordered

## 2021-01-11 NOTE — Progress Notes (Signed)
Subjective:    Patient ID: Sylvia Conner, female    DOB: [birth year], 65 y.o.   MRN: [id number]  This visit occurred during the SARS-CoV-2 public health emergency.  Safety protocols were in place, including screening questions prior to the visit, additional usage of staff PPE, and extensive cleaning of exam room while observing appropriate contact time as indicated for disinfecting solutions.     HPI The patient is here for follow up of their chronic medical problems, including htn, DM, hld, hypothyroidism, joint pain  She is interested in seeing a new rheumatologist.  She saw Dr Estanislado Pandy once for b/l  shoulder joint pain and left knee pain.  Her ANA was positive.  Xrays done by Dr Estanislado Pandy showed osteoarthritis in b/l shoulders, hands and knee.  She was out of town because her father died and had to cancel a couple of appointments and was discharged from the practice.  Her joint pain is better.  She wants to know if there is anything else that she needs to do or that would help.  Needs to change trulicity for patient assistance.  She has been doing well with the Ozempic.  She feels her sugars have been controlled at home.  She states she is eating well.  She is taking all of her medications as prescribed.    Medications and allergies reviewed with patient and updated if appropriate.  Patient Active Problem List   Diagnosis Date Noted   Breast pain, right 08/18/2020   Myalgia 06/03/2020   Arthralgia 06/03/2020   Right-sided chest pain 06/03/2020   Constipation 12/27/2019   Pruritus 08/08/2019   Episcleritis of left eye 06/21/2019   Hyperlipidemia 11/30/2018   Lipoma of arm 05/03/2017   Eczema, feet 12/24/2016   Left lower quadrant pain 05/04/2016   Right shoulder pain 10/31/2015   Adnexal mass 10/31/2015   HTN (hypertension) 05/24/2011   DM (diabetes mellitus) ([place]) 05/24/2011   Hypothyroidism 05/24/2011   History of hysterectomy 05/24/2011    Current Outpatient  Medications on File Prior to Visit  Medication Sig Dispense Refill   blood glucose meter kit and supplies KIT Dispense based on patient and insurance preference. Use up to four times daily as directed. (FOR E11.9). 1 each 0   hydrOXYzine (ATARAX/VISTARIL) 25 MG tablet TAKE 1 TABLET BY MOUTH THREE TIMES DAILY AS NEEDED FOR ITCHING 90 tablet 0   ibuprofen (ADVIL,MOTRIN) 600 MG tablet Take 1 tablet (600 mg total) by mouth every 6 (six) hours as needed. 30 tablet 0   levothyroxine (SYNTHROID) 125 MCG tablet Take 1 tablet (125 mcg total) by mouth daily. 90 tablet 3   meloxicam (MOBIC) 15 MG tablet Take 1 tablet (15 mg total) by mouth daily. (Patient taking differently: Take 15 mg by mouth daily as needed.) 20 tablet 0   metFORMIN (GLUCOPHAGE) 1000 MG tablet TAKE 1 TABLET BY MOUTH TWICE DAILY WITH A MEAL 180 tablet 0   Multiple Vitamins-Minerals (HAIR SKIN AND NAILS FORMULA PO) Take 1 tablet by mouth daily.     pioglitazone (ACTOS) 30 MG tablet Take 1 tablet (30 mg total) by mouth daily. 30 tablet 5   potassium chloride SA (KLOR-CON) 20 MEQ tablet Take 2 tablets (40 mEq total) by mouth daily. 180 tablet 1   Semaglutide, 1 MG/DOSE, (OZEMPIC, 1 MG/DOSE,) 2 MG/1.5ML SOPN Inject 1 mg into the skin once a week. 9 mL 3   triamcinolone cream (KENALOG) 0.5 % Apply topically 2 (two) times daily as needed. [place]  g 1   triamterene-hydrochlorothiazide (MAXZIDE) 75-50 MG tablet TAKE 1 TABLET BY MOUTH ONCE DAILY IN THE MORNING 90 tablet 0   No current facility-administered medications on file prior to visit.    Past Medical History:  Diagnosis Date   Anemia    Arthritis    Diabetes mellitus    Hypertension    Thyroid disease    UTI (lower urinary tract infection)     Past Surgical History:  Procedure Laterality Date   CESAREAN SECTION     COLONOSCOPY     PARTIAL HYSTERECTOMY     1995    Social History   Socioeconomic History   Marital status: Married    Spouse name: Not on file   Number of  children: Not on file   Years of education: Not on file   Highest education level: Not on file  Occupational History   Not on file  Tobacco Use   Smoking status: Never   Smokeless tobacco: Never  Vaping Use   Vaping Use: Never used  Substance and Sexual Activity   Alcohol use: No   Drug use: No   Sexual activity: Not on file  Other Topics Concern   Not on file  Social History Narrative   Not on file   Social Determinants of Health   Financial Resource Strain: Not on file  Food Insecurity: Not on file  Transportation Needs: Not on file  Physical Activity: Not on file  Stress: Not on file  Social Connections: Not on file    Family History  Problem Relation Age of Onset   Hypertension Mother    Diabetes Mother    Diabetes Sister    Diabetes Paternal Grandmother    Diabetes Other    Healthy Daughter    Healthy Daughter     Review of Systems  Constitutional:  Negative for chills and fever.  HENT:  Negative for postnasal drip and sore throat.   Respiratory:  Positive for cough (tickle in throat). Negative for shortness of breath and wheezing.   Cardiovascular:  Positive for leg swelling (mild). Negative for chest pain and palpitations.  Gastrointestinal:  Negative for abdominal pain.       No gerd  Neurological:  Negative for light-headedness and headaches.      Objective:   Vitals:   01/12/21 1112  BP: 118/76  Pulse: 83  Temp: 98.1 F (36.7 C)  SpO2: 97%   BP Readings from Last 3 Encounters:  01/12/21 118/76  10/28/20 127/78  09/12/20 135/84   Wt Readings from Last 3 Encounters:  01/12/21 156 lb (70.8 kg)  09/12/20 159 lb (72.1 kg)  08/18/20 159 lb 12.8 oz (72.5 kg)   Body mass index is 28.08 kg/m.   Physical Exam    Constitutional: Appears well-developed and well-nourished. No distress.  HENT:  Head: Normocephalic and atraumatic.  Neck: Neck supple. No tracheal deviation present. No thyromegaly present.  No cervical  lymphadenopathy Cardiovascular: Normal rate, regular rhythm and normal heart sounds.   No murmur heard. No carotid bruit .  No edema Pulmonary/Chest: Effort normal and breath sounds normal. No respiratory distress. No has no wheezes. No rales. Musculoskeletal: No joint deformities or obvious joint swelling on exam Skin: Skin is warm and dry. Not diaphoretic.  Psychiatric: Normal mood and affect. Behavior is normal.      Assessment & Plan:    See Problem List for Assessment and Plan of chronic medical problems.

## 2021-01-12 ENCOUNTER — Other Ambulatory Visit: Payer: Self-pay

## 2021-01-12 ENCOUNTER — Encounter: Payer: Self-pay | Admitting: Internal Medicine

## 2021-01-12 ENCOUNTER — Ambulatory Visit (INDEPENDENT_AMBULATORY_CARE_PROVIDER_SITE_OTHER): Payer: 59 | Admitting: Internal Medicine

## 2021-01-12 VITALS — BP 118/76 | HR 83 | Temp 98.1°F | Ht 62.5 in | Wt 156.0 lb

## 2021-01-12 DIAGNOSIS — I10 Essential (primary) hypertension: Secondary | ICD-10-CM | POA: Diagnosis not present

## 2021-01-12 DIAGNOSIS — E1165 Type 2 diabetes mellitus with hyperglycemia: Secondary | ICD-10-CM

## 2021-01-12 DIAGNOSIS — Z794 Long term (current) use of insulin: Secondary | ICD-10-CM

## 2021-01-12 DIAGNOSIS — E039 Hypothyroidism, unspecified: Secondary | ICD-10-CM

## 2021-01-12 DIAGNOSIS — E782 Mixed hyperlipidemia: Secondary | ICD-10-CM | POA: Diagnosis not present

## 2021-01-12 DIAGNOSIS — M2550 Pain in unspecified joint: Secondary | ICD-10-CM

## 2021-01-12 LAB — COMPREHENSIVE METABOLIC PANEL
ALT: 12 U/L (ref 0–35)
AST: 21 U/L (ref 0–37)
Albumin: 4.3 g/dL (ref 3.5–5.2)
Alkaline Phosphatase: 101 U/L (ref 39–117)
BUN: 10 mg/dL (ref 6–23)
CO2: 30 mEq/L (ref 19–32)
Calcium: 9.9 mg/dL (ref 8.4–10.5)
Chloride: 97 mEq/L (ref 96–112)
Creatinine, Ser: 0.85 mg/dL (ref 0.40–1.20)
GFR: 72.69 mL/min (ref >60.00)
Glucose, Bld: 169 mg/dL — ABNORMAL HIGH (ref 70–99)
Potassium: 3.4 mEq/L — ABNORMAL LOW (ref 3.5–5.1)
Sodium: 135 mEq/L (ref 135–145)
Total Bilirubin: 0.8 mg/dL (ref 0.2–1.2)
Total Protein: 8.1 g/dL (ref 6.0–8.3)

## 2021-01-12 LAB — HEMOGLOBIN A1C: Hgb A1c MFr Bld: 10.6 % — ABNORMAL HIGH (ref 4.6–6.5)

## 2021-01-12 LAB — MICROALBUMIN / CREATININE URINE RATIO
Creatinine,U: 96.1 mg/dL
Microalb Creat Ratio: 0.9 mg/g (ref 0.0–30.0)
Microalb, Ur: 0.8 mg/dL (ref 0.0–1.9)

## 2021-01-12 LAB — LIPID PANEL
Cholesterol: 231 mg/dL — ABNORMAL HIGH (ref 0–200)
HDL: 74.1 mg/dL (ref >39.00)
LDL Cholesterol: 126 mg/dL — ABNORMAL HIGH (ref 0–99)
NonHDL: 157.3
Total CHOL/HDL Ratio: 3
Triglycerides: 155 mg/dL — ABNORMAL HIGH (ref 0.0–149.0)
VLDL: 31 mg/dL (ref 0.0–40.0)

## 2021-01-12 LAB — TSH: TSH: 7.78 u[IU]/mL — ABNORMAL HIGH (ref 0.35–5.50)

## 2021-01-12 NOTE — Assessment & Plan Note (Signed)
Chronic Not ideally controlled Check A1c today Will have to change Ozempic to Trulicity so that she can get patient assistance-she will give [place] the paperwork Continue Ozempic 1 mg weekly for now Continue Actos 30 mg daily Continue metformin 1000 mg twice daily Will adjust medication based on A1c

## 2021-01-12 NOTE — Assessment & Plan Note (Signed)
Chronic  Clinically euthyroid Currently taking levothyroxine 125 mcg daily Check tsh  Titrate med dose if needed  

## 2021-01-12 NOTE — Patient Instructions (Addendum)
  Blood work was ordered.     Medications changes include :   none    A referral was ordered for  rheumatology.      Someone from their office will call you to schedule an appointment.    Please followup in 6 months

## 2021-01-12 NOTE — Assessment & Plan Note (Signed)
Chronic Blood pressure well controlled CMP Continue Maxide 75-50 mg daily

## 2021-01-12 NOTE — Assessment & Plan Note (Addendum)
Chronic Regular exercise and healthy diet encouraged Check lipid panel  ?  Taking atorvastatin

## 2021-01-12 NOTE — Assessment & Plan Note (Signed)
Chronic Having pain in bilateral shoulders, hands and knees Did have a positive ANA and was referred to rheumatology Additional blood work was done and x-rays were done ANA is positive X-rays more suggestive of osteoarthritis She was not able to do a follow-up with rheumatology and would like to have a follow-up, but needs to change practices-she would like to understand what she needs to do for her joint pain-we will refer

## 2021-01-15 MED ORDER — LEVOTHYROXINE SODIUM 137 MCG PO CAPS: 137.0000 ug | ORAL_CAPSULE | Freq: Every day | ORAL | 1 refills | Status: DC

## 2021-01-15 NOTE — Telephone Encounter (Signed)
Spoke with pt and was able to inform her of Carla's note about labs and Dr. Quay Burow advice.  **Pt did not want to start any meds until she speaks with Dr. Quay Burow and I have scheduled an apptmnt for the pt on Monday 01/19/2021 at 10:40.

## 2021-01-15 NOTE — Addendum Note (Signed)
Addended by: Binnie Rail on: 01/15/2021 05:41 AM   Modules accepted: Orders

## 2021-01-16 ENCOUNTER — Telehealth: Payer: Self-pay | Admitting: Internal Medicine

## 2021-01-16 ENCOUNTER — Other Ambulatory Visit: Payer: Self-pay

## 2021-01-16 MED ORDER — LEVOTHYROXINE SODIUM 137 MCG PO CAPS: 137.0000 ug | ORAL_CAPSULE | Freq: Every day | ORAL | 1 refills | Status: DC

## 2021-01-16 NOTE — Telephone Encounter (Signed)
Re-faxed today.

## 2021-01-16 NOTE — Telephone Encounter (Signed)
Patient says provider was suppose to be sending over a new rx to pharmacy bc of the increase in dosage for Thyroid med  Pharmacy told patient they have not gotten it yet.. please follow up

## 2021-01-16 NOTE — Telephone Encounter (Signed)
Levothyroxine Sodium 137 MCG CAPS 90 capsule 1 01/15/2021    Sig - Route: Take 1 capsule (137 mcg total) by mouth daily before breakfast. - Oral   Sent to pharmacy as: Levothyroxine Sodium 137 MCG Cap   E-Prescribing Status: Receipt confirmed by pharmacy (01/15/2021  5:41 AM EDT)

## 2021-01-18 NOTE — Progress Notes (Signed)
Subjective:    Patient ID: Sylvia Conner, female    DOB: 21-Dec-1955, 65 y.o.   MRN: [id number]  This visit occurred during the SARS-CoV-2 public health emergency.  Safety protocols were in place, including screening questions prior to the visit, additional usage of staff PPE, and extensive cleaning of exam room while observing appropriate contact time as indicated for disinfecting solutions.     HPI The patient is here for follow up on recent labs  She is here to discuss recent blood work-elevated A1c, elevated cholesterol and elevated TSH.  Has not yet started higher dose of thyroid medication-was not covered by her insurance.  Was on a cholesterol medication at 1 point, but not currently taking.  Her recent a1c was 10.6%.  She states she has been taking the metformin daily as prescribed.  She admits she has not been taking the Actos on a regular basis.  She admits she has been soda, sweet tea and she also eats potatoes and pasta.  She also drinks the [employer] red blood orange refreshers.  I had advised insulin, but she would like to work on diet and is willing to see a nutritionist would like to avoid insulin.    Medications and allergies reviewed with patient and updated if appropriate.  Patient Active Problem List   Diagnosis Date Noted   Breast pain, right 08/18/2020   Myalgia 06/03/2020   Arthralgia 06/03/2020   Right-sided chest pain 06/03/2020   Constipation 12/27/2019   Pruritus 08/08/2019   Episcleritis of left eye 06/21/2019   Hyperlipidemia 11/30/2018   Lipoma of arm 05/03/2017   Eczema, feet 12/24/2016   Left lower quadrant pain 05/04/2016   Right shoulder pain 10/31/2015   Adnexal mass 10/31/2015   HTN (hypertension) 05/24/2011   DM (diabetes mellitus) ([place]) 05/24/2011   Hypothyroidism 05/24/2011   History of hysterectomy 05/24/2011    Current Outpatient Medications on File Prior to Visit  Medication Sig Dispense Refill   blood glucose meter kit  and supplies KIT Dispense based on patient and insurance preference. Use up to four times daily as directed. (FOR E11.9). 1 each 0   hydrOXYzine (ATARAX/VISTARIL) 25 MG tablet TAKE 1 TABLET BY MOUTH THREE TIMES DAILY AS NEEDED FOR ITCHING 90 tablet 0   ibuprofen (ADVIL,MOTRIN) 600 MG tablet Take 1 tablet (600 mg total) by mouth every 6 (six) hours as needed. 30 tablet 0   meloxicam (MOBIC) 15 MG tablet Take 1 tablet (15 mg total) by mouth daily. (Patient taking differently: Take 15 mg by mouth daily as needed.) 20 tablet 0   metFORMIN (GLUCOPHAGE) 1000 MG tablet TAKE 1 TABLET BY MOUTH TWICE DAILY WITH A MEAL 180 tablet 0   Multiple Vitamins-Minerals (HAIR SKIN AND NAILS FORMULA PO) Take 1 tablet by mouth daily.     pioglitazone (ACTOS) 30 MG tablet Take 1 tablet (30 mg total) by mouth daily. 30 tablet 5   potassium chloride SA (KLOR-CON) 20 MEQ tablet Take 2 tablets (40 mEq total) by mouth daily. 180 tablet 1   Semaglutide, 1 MG/DOSE, (OZEMPIC, 1 MG/DOSE,) 2 MG/1.5ML SOPN Inject 1 mg into the skin once a week. 9 mL 3   triamcinolone cream (KENALOG) 0.5 % Apply topically 2 (two) times daily as needed. 30 g 1   triamterene-hydrochlorothiazide (MAXZIDE) 75-50 MG tablet TAKE 1 TABLET BY MOUTH ONCE DAILY IN THE MORNING 90 tablet 0   Levothyroxine Sodium 137 MCG CAPS Take 1 capsule (137 mcg total) by mouth daily  before breakfast. (Patient not taking: Reported on 01/19/2021) 90 capsule 1   No current facility-administered medications on file prior to visit.    Past Medical History:  Diagnosis Date   Anemia    Arthritis    Diabetes mellitus    Hypertension    Thyroid disease    UTI (lower urinary tract infection)     Past Surgical History:  Procedure Laterality Date   CESAREAN SECTION     COLONOSCOPY     PARTIAL HYSTERECTOMY     1995    Social History   Socioeconomic History   Marital status: Married    Spouse name: Not on file   Number of children: Not on file   Years of education:  Not on file   Highest education level: Not on file  Occupational History   Not on file  Tobacco Use   Smoking status: Never   Smokeless tobacco: Never  Vaping Use   Vaping Use: Never used  Substance and Sexual Activity   Alcohol use: No   Drug use: No   Sexual activity: Not on file  Other Topics Concern   Not on file  Social History Narrative   Not on file   Social Determinants of Health   Financial Resource Strain: Not on file  Food Insecurity: Not on file  Transportation Needs: Not on file  Physical Activity: Not on file  Stress: Not on file  Social Connections: Not on file    Family History  Problem Relation Age of Onset   Hypertension Mother    Diabetes Mother    Diabetes Sister    Diabetes Paternal Grandmother    Diabetes Other    Healthy Daughter    Healthy Daughter     Review of Systems     Objective:   Vitals:   01/19/21 1056  BP: 116/78  Pulse: 89  Temp: 98.7 F (37.1 C)  SpO2: 98%   BP Readings from Last 3 Encounters:  01/19/21 116/78  01/12/21 118/76  10/28/20 127/78   Wt Readings from Last 3 Encounters:  01/12/21 156 lb (70.8 kg)  09/12/20 159 lb (72.1 kg)  08/18/20 159 lb 12.8 oz (72.5 kg)   Body mass index is 28.08 kg/m.   Physical Exam       Lab Results  Component Value Date   WBC 5.0 10/28/2020   HGB 11.9 (L) 10/28/2020   HCT 39.4 10/28/2020   PLT 325 10/28/2020   GLUCOSE 169 (H) 01/12/2021   CHOL 231 (H) 01/12/2021   TRIG 155.0 (H) 01/12/2021   HDL 74.10 01/12/2021   LDLCALC 126 (H) 01/12/2021   ALT 12 01/12/2021   AST 21 01/12/2021   NA 135 01/12/2021   K 3.4 (L) 01/12/2021   CL 97 01/12/2021   CREATININE 0.85 01/12/2021   BUN 10 01/12/2021   CO2 30 01/12/2021   TSH 7.78 (H) 01/12/2021   HGBA1C 10.6 (H) 01/12/2021   MICROALBUR 0.8 01/12/2021     Assessment & Plan:    I spent 30 minutes dedicated to the care of this patient on the date of this encounter including review of recent labs, obtaining  history, communicating with the patient, educating her regarding her cholesterol and uncontrolled diabetes, discussing diabetic diet in detail, ordering medications, referral, and documenting clinical information in the EHR   See Problem List for Assessment and Plan of chronic medical problems.

## 2021-01-19 ENCOUNTER — Other Ambulatory Visit: Payer: Self-pay

## 2021-01-19 ENCOUNTER — Encounter: Payer: Self-pay | Admitting: Internal Medicine

## 2021-01-19 ENCOUNTER — Ambulatory Visit (INDEPENDENT_AMBULATORY_CARE_PROVIDER_SITE_OTHER): Payer: 59 | Admitting: Internal Medicine

## 2021-01-19 VITALS — BP 116/78 | HR 89 | Temp 98.7°F | Ht 62.5 in

## 2021-01-19 DIAGNOSIS — E039 Hypothyroidism, unspecified: Secondary | ICD-10-CM | POA: Diagnosis not present

## 2021-01-19 DIAGNOSIS — E782 Mixed hyperlipidemia: Secondary | ICD-10-CM | POA: Diagnosis not present

## 2021-01-19 DIAGNOSIS — Z794 Long term (current) use of insulin: Secondary | ICD-10-CM

## 2021-01-19 DIAGNOSIS — E1165 Type 2 diabetes mellitus with hyperglycemia: Secondary | ICD-10-CM

## 2021-01-19 MED ORDER — LEVOTHYROXINE SODIUM 137 MCG PO TABS: 137.0000 ug | ORAL_TABLET | Freq: Every day | ORAL | 1 refills | Status: DC

## 2021-01-19 MED ORDER — ROSUVASTATIN CALCIUM 10 MG PO TABS: 10.0000 mg | ORAL_TABLET | Freq: Every day | ORAL | 3 refills | Status: DC

## 2021-01-19 NOTE — Assessment & Plan Note (Signed)
Chronic Was on atorvastatin at 1 point, but is no longer taking-we may have discontinue this when she was experiencing increased joint pain to rule out it as a potential cause Recent LDL above goal-restart statin-start rosuvastatin 10 mg daily

## 2021-01-19 NOTE — Assessment & Plan Note (Signed)
Chronic Poorly controlled She has been noncompliant with a diabetic diet-drinking soda, sweet tea, [employer] refreshers and eating probably too much [place] and potatoes Stressed that compliance with a diabetic diet and potential for getting her sugars better controlled and avoiding more medication-she will make changes immediately Referral ordered for nutrition Restart Actos 30 mg daily Continue metformin 1000 mg twice daily Continue Ozempic 1 mg weekly-this will need to be changed to Trulicity due to patient assistance program Follow-up in 3 months

## 2021-01-19 NOTE — Patient Instructions (Addendum)
    Medications changes include :   restart the Actos.  Start crestor 10 mg daily for your cholesterol.   Your prescription(s) have been submitted to your pharmacy. Please take as directed and contact our office if you believe you are having problem(s) with the medication(s).   A referral was ordered for a diabetic nutritionist.       Someone from their office will call you to schedule an appointment.    Please followup in 3 months

## 2021-01-19 NOTE — Assessment & Plan Note (Signed)
Chronic Recent TSH elevated Increased levothyroxine to 137 mcg daily-she states this was not covered by her insurance-we sent prescription in tablet form and hopefully this is covered-if not advised that she needs to let me know so we can call pharmacy

## 2021-01-20 ENCOUNTER — Telehealth: Payer: Self-pay | Admitting: Internal Medicine

## 2021-01-20 NOTE — Telephone Encounter (Signed)
Ann from [facility] has called and states that the pt. Was referred to their office, and is requesting a copy of the records from Dr. Jefferson Fuel office. States that the pt. Says we have the records. Would like them faxed ASAP    Fax #- [phone] Callback #- [phone] ext. [place]

## 2021-01-20 NOTE — Telephone Encounter (Signed)
Faxed today

## 2021-01-21 ENCOUNTER — Telehealth: Payer: Self-pay

## 2021-01-21 NOTE — Telephone Encounter (Signed)
Please advise as the pt has stated a fax has been sent in for an approval of her Trulicity and wants to make sure it is faxed back as she needs her medication. Paperwork is coming from [employer].  **Please call pt as soon as paperwork is faxed.

## 2021-01-22 NOTE — Telephone Encounter (Signed)
Spoke with patient today and papers faxed in.  Made copy and mailed out to her today.

## 2021-02-02 ENCOUNTER — Other Ambulatory Visit: Payer: Self-pay | Admitting: Internal Medicine

## 2021-02-08 NOTE — Progress Notes (Signed)
Subjective:    Patient ID: Sylvia Conner, female    DOB: 01/19/1956, 65 y.o.   MRN: [id number]  This visit occurred during the SARS-CoV-2 public health emergency.  Safety protocols were in place, including screening questions prior to the visit, additional usage of staff PPE, and extensive cleaning of exam room while observing appropriate contact time as indicated for disinfecting solutions.    HPI She is here for an acute visit for cold symptoms.   Her symptoms started 2 weeks ago    She is experiencing sore throat, which did improve.  She states postnasal drip and a feeling of mucus in her throat.  She is clearing her throat because of that.  She also has an intermittent cough because of a tickle in her throat.  She has experienced a right ear throbbing and at times nasal congestion.  She has had some lightheadedness and dizziness.  She has tried taking nothing.     Negative covid test.    She has been making a lot of changes in her diet.  She is no longer drinking sweet tea or any of the other sugary drinks.  She has lost weight, which concerns for a [place], but is likely related to her change in diet.   Medications and allergies reviewed with patient and updated if appropriate.  Patient Active Problem List   Diagnosis Date Noted   Breast pain, right 08/18/2020   Myalgia 06/03/2020   Arthralgia 06/03/2020   Right-sided chest pain 06/03/2020   Constipation 12/27/2019   Pruritus 08/08/2019   Episcleritis of left eye 06/21/2019   Hyperlipidemia 11/30/2018   Lipoma of arm 05/03/2017   Eczema, feet 12/24/2016   Left lower quadrant pain 05/04/2016   Right shoulder pain 10/31/2015   Adnexal mass 10/31/2015   HTN (hypertension) 05/24/2011   DM (diabetes mellitus) ([place]) 05/24/2011   Hypothyroidism 05/24/2011   History of hysterectomy 05/24/2011    Current Outpatient Medications on File Prior to Visit  Medication Sig Dispense Refill   blood glucose meter kit and  supplies KIT Dispense based on patient and insurance preference. Use up to four times daily as directed. (FOR E11.9). 1 each 0   hydrOXYzine (ATARAX/VISTARIL) 25 MG tablet TAKE 1 TABLET BY MOUTH THREE TIMES DAILY AS NEEDED FOR ITCHING 90 tablet 0   ibuprofen (ADVIL,MOTRIN) 600 MG tablet Take 1 tablet (600 mg total) by mouth every 6 (six) hours as needed. 30 tablet 0   levothyroxine (SYNTHROID) 137 MCG tablet Take 1 tablet (137 mcg total) by mouth daily before breakfast. 90 tablet 1   meloxicam (MOBIC) 15 MG tablet Take 1 tablet (15 mg total) by mouth daily. (Patient taking differently: Take 15 mg by mouth daily as needed.) 20 tablet 0   metFORMIN (GLUCOPHAGE) 1000 MG tablet TAKE 1 TABLET BY MOUTH TWICE DAILY WITH A MEAL 180 tablet 0   Multiple Vitamins-Minerals (HAIR SKIN AND NAILS FORMULA PO) Take 1 tablet by mouth daily.     pioglitazone (ACTOS) 30 MG tablet Take 1 tablet (30 mg total) by mouth daily. 30 tablet 5   potassium chloride SA (KLOR-CON) 20 MEQ tablet Take 2 tablets (40 mEq total) by mouth daily. 180 tablet 1   rosuvastatin (CRESTOR) 10 MG tablet Take 1 tablet (10 mg total) by mouth daily. 90 tablet 3   Semaglutide, 1 MG/DOSE, (OZEMPIC, 1 MG/DOSE,) 2 MG/1.5ML SOPN Inject 1 mg into the skin once a week. 9 mL 3   triamcinolone cream (KENALOG) 0.5 % Apply  topically 2 (two) times daily as needed. 30 g 1   triamterene-hydrochlorothiazide (MAXZIDE) 75-50 MG tablet TAKE 1 TABLET BY MOUTH ONCE DAILY IN THE MORNING 90 tablet 0   No current facility-administered medications on file prior to visit.    Past Medical History:  Diagnosis Date   Anemia    Arthritis    Diabetes mellitus    Hypertension    Thyroid disease    UTI (lower urinary tract infection)     Past Surgical History:  Procedure Laterality Date   CESAREAN SECTION     COLONOSCOPY     PARTIAL HYSTERECTOMY     1995    Social History   Socioeconomic History   Marital status: Married    Spouse name: Not on file    Number of children: Not on file   Years of education: Not on file   Highest education level: Not on file  Occupational History   Not on file  Tobacco Use   Smoking status: Never   Smokeless tobacco: Never  Vaping Use   Vaping Use: Never used  Substance and Sexual Activity   Alcohol use: No   Drug use: No   Sexual activity: Not on file  Other Topics Concern   Not on file  Social History Narrative   Not on file   Social Determinants of Health   Financial Resource Strain: Not on file  Food Insecurity: Not on file  Transportation Needs: Not on file  Physical Activity: Not on file  Stress: Not on file  Social Connections: Not on file    Family History  Problem Relation Age of Onset   Hypertension Mother    Diabetes Mother    Diabetes Sister    Diabetes Paternal Grandmother    Diabetes Other    Healthy Daughter    Healthy Daughter     Review of Systems  Constitutional:  Negative for fever.  HENT:  Positive for congestion (sometimes), ear pain (right- throbbing), postnasal drip (clearing throat) and sore throat. Negative for sinus pressure and sinus pain.   Respiratory:  Positive for cough (tickle in back of throat). Negative for shortness of breath and wheezing.   Neurological:  Positive for dizziness and light-headedness. Negative for headaches.      Objective:   Vitals:   02/09/21 1050  BP: 120/72  Pulse: 92  Temp: 98.3 F (36.8 C)  SpO2: 98%   BP Readings from Last 3 Encounters:  02/09/21 120/72  01/19/21 116/78  01/12/21 118/76   Wt Readings from Last 3 Encounters:  02/09/21 151 lb (68.5 kg)  01/12/21 156 lb (70.8 kg)  09/12/20 159 lb (72.1 kg)   Body mass index is 27.18 kg/m.   Physical Exam    GENERAL APPEARANCE: Appears stated age, well appearing, NAD EYES: conjunctiva clear, no icterus HENT: bilateral tympanic membranes and ear canals normal, oropharynx with no erythema or exudates, trachea midline, no cervical or supraclavicular  lymphadenopathy LUNGS: Unlabored breathing, good air entry bilaterally, clear to auscultation without wheeze or crackles CARDIOVASCULAR: Normal S1,S2 , no edema SKIN: Warm, dry      Assessment & Plan:    See Problem List for Assessment and Plan of chronic medical problems.

## 2021-02-09 ENCOUNTER — Ambulatory Visit (INDEPENDENT_AMBULATORY_CARE_PROVIDER_SITE_OTHER): Payer: 59 | Admitting: Internal Medicine

## 2021-02-09 ENCOUNTER — Encounter: Payer: Self-pay | Admitting: Internal Medicine

## 2021-02-09 ENCOUNTER — Other Ambulatory Visit: Payer: Self-pay

## 2021-02-09 DIAGNOSIS — I10 Essential (primary) hypertension: Secondary | ICD-10-CM

## 2021-02-09 DIAGNOSIS — J069 Acute upper respiratory infection, unspecified: Secondary | ICD-10-CM | POA: Diagnosis not present

## 2021-02-09 MED ORDER — LEVOCETIRIZINE DIHYDROCHLORIDE 5 MG PO TABS: 5.0000 mg | ORAL_TABLET | Freq: Every evening | ORAL | 0 refills | Status: DC

## 2021-02-09 NOTE — Assessment & Plan Note (Signed)
Chronic Blood pressure well controlled Continue Maxide 75-50 mg daily

## 2021-02-09 NOTE — Patient Instructions (Addendum)
    Medications changes include :   xyzal 5 mg daily at bedtime   Your prescription(s) have been submitted to your pharmacy. Please take as directed and contact our office if you believe you are having problem(s) with the medication(s).

## 2021-02-09 NOTE — Assessment & Plan Note (Signed)
Acute Possible viral URI +/- allergic rhinitis I do not think she has a bacterial infection and does not need antibiotics at this time Started Xyzal 5 mg every evening-hopefully that will help with some of the drainage, sore throat and clearing of the throat/cough Can take over-the-counter cold medication for symptom relief She will let me know if there is no improvement

## 2021-02-18 ENCOUNTER — Telehealth: Payer: Self-pay | Admitting: Internal Medicine

## 2021-02-18 MED ORDER — DOXYCYCLINE HYCLATE 100 MG PO TABS: 100.0000 mg | ORAL_TABLET | Freq: Two times a day (BID) | ORAL | 0 refills | Status: AC

## 2021-02-18 NOTE — Telephone Encounter (Signed)
I sent an antibiotic to [facility].

## 2021-02-18 NOTE — Telephone Encounter (Signed)
Patient calling in  Patient had OV 11/07  Patient says she is not feeling any better..still coughing w/ a lot of mucus  Patient says the rx levocetirizine (XYZAL) 5 MG tablet is not working & wants to know if provider can send in something else  [place] [place] Phone:  [phone]  Fax:  [phone]      Please call [phone]

## 2021-02-18 NOTE — Telephone Encounter (Signed)
Patient notified

## 2021-03-01 ENCOUNTER — Encounter: Payer: Self-pay | Admitting: Internal Medicine

## 2021-03-01 NOTE — Progress Notes (Signed)
Subjective:    Patient ID: Sylvia Conner, female    DOB: 1955-10-30, 65 y.o.   MRN: [id number]  This visit occurred during the SARS-CoV-2 public health emergency.  Safety protocols were in place, including screening questions prior to the visit, additional usage of staff PPE, and extensive cleaning of exam room while observing appropriate contact time as indicated for disinfecting solutions.    HPI The patient is here for an acute visit.   This started about one month ago.  Her throat feels like it is trying to close up.  She has this mucus in the throat and has to cough it up to get it out.  She can cough it up.  The allergy medication ( which she stopped when she started the abx) and antibiotic did not seem to help much - maybe the antibiotic helped a [place].  It is worse in the evening. Her symptoms were a [place] better today.     She denies reflux or gerd.  She denies dysphagia.     Medications and allergies reviewed with patient and updated if appropriate.  Patient Active Problem List   Diagnosis Date Noted   URI (upper respiratory infection) 02/09/2021   Breast pain, right 08/18/2020   Myalgia 06/03/2020   Arthralgia 06/03/2020   Right-sided chest pain 06/03/2020   Constipation 12/27/2019   Pruritus 08/08/2019   Episcleritis of left eye 06/21/2019   Hyperlipidemia 11/30/2018   Lipoma of arm 05/03/2017   Eczema, feet 12/24/2016   Left lower quadrant pain 05/04/2016   Right shoulder pain 10/31/2015   Adnexal mass 10/31/2015   HTN (hypertension) 05/24/2011   DM (diabetes mellitus) ([place]) 05/24/2011   Hypothyroidism 05/24/2011   History of hysterectomy 05/24/2011    Current Outpatient Medications on File Prior to Visit  Medication Sig Dispense Refill   blood glucose meter kit and supplies KIT Dispense based on patient and insurance preference. Use up to four times daily as directed. (FOR E11.9). 1 each 0   ibuprofen (ADVIL,MOTRIN) 600 MG tablet Take 1 tablet (600 mg  total) by mouth every 6 (six) hours as needed. 30 tablet 0   levocetirizine (XYZAL) 5 MG tablet Take 1 tablet (5 mg total) by mouth every evening. 30 tablet 0   levothyroxine (SYNTHROID) 137 MCG tablet Take 1 tablet (137 mcg total) by mouth daily before breakfast. 90 tablet 1   meloxicam (MOBIC) 15 MG tablet Take 1 tablet (15 mg total) by mouth daily. (Patient taking differently: Take 15 mg by mouth daily as needed.) 20 tablet 0   metFORMIN (GLUCOPHAGE) 1000 MG tablet TAKE 1 TABLET BY MOUTH TWICE DAILY WITH A MEAL 180 tablet 0   Multiple Vitamins-Minerals (HAIR SKIN AND NAILS FORMULA PO) Take 1 tablet by mouth daily.     pioglitazone (ACTOS) 30 MG tablet Take 1 tablet (30 mg total) by mouth daily. 30 tablet 5   potassium chloride SA (KLOR-CON) 20 MEQ tablet Take 2 tablets (40 mEq total) by mouth daily. 180 tablet 1   rosuvastatin (CRESTOR) 10 MG tablet Take 1 tablet (10 mg total) by mouth daily. 90 tablet 3   Semaglutide, 1 MG/DOSE, (OZEMPIC, 1 MG/DOSE,) 2 MG/1.5ML SOPN Inject 1 mg into the skin once a week. 9 mL 3   triamcinolone cream (KENALOG) 0.5 % Apply topically 2 (two) times daily as needed. 30 g 1   triamterene-hydrochlorothiazide (MAXZIDE) 75-50 MG tablet TAKE 1 TABLET BY MOUTH ONCE DAILY IN THE MORNING 90 tablet 0   No current  facility-administered medications on file prior to visit.    Past Medical History:  Diagnosis Date   Anemia    Arthritis    Diabetes mellitus    Hypertension    Thyroid disease    UTI (lower urinary tract infection)     Past Surgical History:  Procedure Laterality Date   CESAREAN SECTION     COLONOSCOPY     PARTIAL HYSTERECTOMY     1995    Social History   Socioeconomic History   Marital status: Married    Spouse name: Not on file   Number of children: Not on file   Years of education: Not on file   Highest education level: Not on file  Occupational History   Not on file  Tobacco Use   Smoking status: Never   Smokeless tobacco: Never   Vaping Use   Vaping Use: Never used  Substance and Sexual Activity   Alcohol use: No   Drug use: No   Sexual activity: Not on file  Other Topics Concern   Not on file  Social History Narrative   Not on file   Social Determinants of Health   Financial Resource Strain: Not on file  Food Insecurity: Not on file  Transportation Needs: Not on file  Physical Activity: Not on file  Stress: Not on file  Social Connections: Not on file    Family History  Problem Relation Age of Onset   Hypertension Mother    Diabetes Mother    Diabetes Sister    Diabetes Paternal Grandmother    Diabetes Other    Healthy Daughter    Healthy Daughter     Review of Systems  Constitutional:  Positive for fatigue. Negative for chills and fever.  HENT:  Positive for congestion (at times) and postnasal drip. Negative for ear pain, rhinorrhea, sinus pressure, sinus pain, sore throat and trouble swallowing.        Right ear feel itchy  Respiratory:  Positive for cough (to clear the mucus in her throat only - white mucus). Negative for shortness of breath and wheezing.   Gastrointestinal:  Negative for nausea.       No gerd, belching      Objective:   Vitals:   03/02/21 0834  BP: 120/74  Pulse: 92  Temp: 98 F (36.7 C)  SpO2: 99%   BP Readings from Last 3 Encounters:  03/02/21 120/74  02/09/21 120/72  01/19/21 116/78   Wt Readings from Last 3 Encounters:  03/02/21 150 lb (68 kg)  02/09/21 151 lb (68.5 kg)  01/12/21 156 lb (70.8 kg)   Body mass index is 27 kg/m.   Physical Exam    GENERAL APPEARANCE: Appears stated age, well appearing, NAD EYES: conjunctiva clear, no icterus HENT: bilateral tympanic membranes and ear canals normal, oropharynx with no erythema or exudates, trachea midline, no cervical or supraclavicular lymphadenopathy LUNGS: Unlabored breathing, good air entry bilaterally, clear to auscultation without wheeze or crackles CARDIOVASCULAR: Normal S1,S2 , no  edema SKIN: Warm, dry      Assessment & Plan:    Screened for depression using the PHQ 9 scale.  She does have issues with sleep and feels tired.  No evidence of depression.   Screened for anxiety using GAD7 Scale.  Her score is 6, showing some mild anxiety.  She is controlling her anxiety and does not need medication.    See Problem List for Assessment and Plan of chronic medical problems.

## 2021-03-01 NOTE — Patient Instructions (Addendum)
     Medications changes include :   restart the xyzal.  If that does not help start pepcid 20 mg daily to see if that helps.      Please call if there is no improvement in your symptoms.

## 2021-03-02 ENCOUNTER — Ambulatory Visit (INDEPENDENT_AMBULATORY_CARE_PROVIDER_SITE_OTHER): Payer: 59 | Admitting: Internal Medicine

## 2021-03-02 ENCOUNTER — Other Ambulatory Visit: Payer: Self-pay

## 2021-03-02 VITALS — BP 120/74 | HR 92 | Temp 98.0°F | Ht 62.5 in | Wt 150.0 lb

## 2021-03-02 DIAGNOSIS — Z1331 Encounter for screening for depression: Secondary | ICD-10-CM | POA: Diagnosis not present

## 2021-03-02 DIAGNOSIS — R0982 Postnasal drip: Secondary | ICD-10-CM | POA: Diagnosis not present

## 2021-03-02 NOTE — Assessment & Plan Note (Signed)
Subacute Mucus - white in color in throat - causing clearing and occasional cough Briefly took xyzal, took abx No evidence of infection, denies heartburn Restart xyzal 5 mg nightly If no improvement, consider starting pepcid 20 mg daily for 2-4 weeks for ? gerd

## 2021-03-27 ENCOUNTER — Ambulatory Visit: Payer: 59 | Admitting: Internal Medicine

## 2021-04-02 ENCOUNTER — Telehealth: Payer: Self-pay | Admitting: Internal Medicine

## 2021-04-02 NOTE — Telephone Encounter (Signed)
1.Medication Requested: levothyroxine (SYNTHROID) 137 MCG tablet  2. Pharmacy (Name, Street, [place]): [facility] [address]  3. On Med List: yes  4. Last Visit with PCP: 03-02-2021  5. Next visit date with PCP: 04-10-2021   Agent: Please be advised that RX refills may take up to 3 business days. We ask that you follow-up with your pharmacy.

## 2021-04-07 ENCOUNTER — Other Ambulatory Visit: Payer: Self-pay

## 2021-04-07 MED ORDER — LEVOTHYROXINE SODIUM 137 MCG PO TABS: 137.0000 ug | ORAL_TABLET | Freq: Every day | ORAL | 1 refills | Status: DC

## 2021-04-07 NOTE — Telephone Encounter (Signed)
Sent in today 

## 2021-04-09 ENCOUNTER — Telehealth: Payer: Self-pay | Admitting: Internal Medicine

## 2021-04-09 NOTE — Progress Notes (Signed)
Subjective:    Patient ID: Sylvia Conner, female    DOB: 1955-07-01, 66 y.o.   MRN: [id number]  This visit occurred during the SARS-CoV-2 public health emergency.  Safety protocols were in place, including screening questions prior to the visit, additional usage of staff PPE, and extensive cleaning of exam room while observing appropriate contact time as indicated for disinfecting solutions.    HPI The patient is here for an acute visit.  Trouble swallowing  - was seen 11/28 with mucus in throat and feeling of throat closing up.  Denied gerd, dysphagia. Started xyzal.  Advised to start pepcid 20 mg x 2-4 week if no improvement with xyzal.    Her swallowing issues are better. She does not think any medication helped.  Never took the pepcid.    Cyst in right axilla - no pain,  still there.  No change in color.  No bigger.     Last Friday glucose was 167.  Not sure of other readings.  Getting better - better about eating less sugars.   Due to have thyroid rechecked but has been out of thyroid med for one week.    Medications and allergies reviewed with patient and updated if appropriate.  Patient Active Problem List   Diagnosis Date Noted   PND (post-nasal drip) 03/02/2021   URI (upper respiratory infection) 02/09/2021   Breast pain, right 08/18/2020   Myalgia 06/03/2020   Arthralgia 06/03/2020   Right-sided chest pain 06/03/2020   Constipation 12/27/2019   Pruritus 08/08/2019   Episcleritis of left eye 06/21/2019   Hyperlipidemia 11/30/2018   Lipoma of arm 05/03/2017   Eczema, feet 12/24/2016   Left lower quadrant pain 05/04/2016   Right shoulder pain 10/31/2015   Adnexal mass 10/31/2015   HTN (hypertension) 05/24/2011   DM (diabetes mellitus) ([place]) 05/24/2011   Hypothyroidism 05/24/2011   History of hysterectomy 05/24/2011    Current Outpatient Medications on File Prior to Visit  Medication Sig Dispense Refill   blood glucose meter kit and supplies KIT Dispense  based on patient and insurance preference. Use up to four times daily as directed. (FOR E11.9). 1 each 0   ibuprofen (ADVIL,MOTRIN) 600 MG tablet Take 1 tablet (600 mg total) by mouth every 6 (six) hours as needed. 30 tablet 0   levocetirizine (XYZAL) 5 MG tablet Take 1 tablet (5 mg total) by mouth every evening. 30 tablet 0   levothyroxine (SYNTHROID) 137 MCG tablet Take 1 tablet (137 mcg total) by mouth daily before breakfast. 90 tablet 1   meloxicam (MOBIC) 15 MG tablet Take 1 tablet (15 mg total) by mouth daily. (Patient taking differently: Take 15 mg by mouth daily as needed.) 20 tablet 0   metFORMIN (GLUCOPHAGE) 1000 MG tablet TAKE 1 TABLET BY MOUTH TWICE DAILY WITH A MEAL 180 tablet 0   Multiple Vitamins-Minerals (HAIR SKIN AND NAILS FORMULA PO) Take 1 tablet by mouth daily.     pioglitazone (ACTOS) 30 MG tablet Take 1 tablet (30 mg total) by mouth daily. 30 tablet 5   potassium chloride SA (KLOR-CON) 20 MEQ tablet Take 2 tablets (40 mEq total) by mouth daily. 180 tablet 1   rosuvastatin (CRESTOR) 10 MG tablet Take 1 tablet (10 mg total) by mouth daily. 90 tablet 3   Semaglutide, 1 MG/DOSE, (OZEMPIC, 1 MG/DOSE,) 2 MG/1.5ML SOPN Inject 1 mg into the skin once a week. 9 mL 3   triamcinolone cream (KENALOG) 0.5 % Apply topically 2 (two) times daily as  needed. 30 g 1   triamterene-hydrochlorothiazide (MAXZIDE) 75-50 MG tablet TAKE 1 TABLET BY MOUTH ONCE DAILY IN THE MORNING 90 tablet 0   No current facility-administered medications on file prior to visit.    Past Medical History:  Diagnosis Date   Anemia    Arthritis    Diabetes mellitus    Hypertension    Thyroid disease    UTI (lower urinary tract infection)     Past Surgical History:  Procedure Laterality Date   CESAREAN SECTION     COLONOSCOPY     PARTIAL HYSTERECTOMY     1995    Social History   Socioeconomic History   Marital status: Married    Spouse name: Not on file   Number of children: Not on file   Years of  education: Not on file   Highest education level: Not on file  Occupational History   Not on file  Tobacco Use   Smoking status: Never   Smokeless tobacco: Never  Vaping Use   Vaping Use: Never used  Substance and Sexual Activity   Alcohol use: No   Drug use: No   Sexual activity: Not on file  Other Topics Concern   Not on file  Social History Narrative   Not on file   Social Determinants of Health   Financial Resource Strain: Not on file  Food Insecurity: Not on file  Transportation Needs: Not on file  Physical Activity: Not on file  Stress: Not on file  Social Connections: Not on file    Family History  Problem Relation Age of Onset   Hypertension Mother    Diabetes Mother    Diabetes Sister    Diabetes Paternal Grandmother    Diabetes Other    Healthy Daughter    Healthy Daughter     Review of Systems  Constitutional:  Negative for fever.  HENT:  Negative for congestion, postnasal drip and trouble swallowing.   Respiratory:  Negative for cough, shortness of breath and wheezing.   Cardiovascular:  Negative for chest pain, palpitations and leg swelling.  Neurological:  Negative for headaches.      Objective:   Vitals:   04/10/21 0841  BP: 140/90  Pulse: 84  Temp: 98.4 F (36.9 C)  SpO2: 99%   BP Readings from Last 3 Encounters:  04/10/21 140/90  03/02/21 120/74  02/09/21 120/72   Wt Readings from Last 3 Encounters:  04/10/21 157 lb 3.2 oz (71.3 kg)  03/02/21 150 lb (68 kg)  02/09/21 151 lb (68.5 kg)   Body mass index is 28.29 kg/m.   Physical Exam    Constitutional: Appears well-developed and well-nourished. No distress.  Head: Normocephalic and atraumatic.  Neck: Neck supple. No tracheal deviation present. No thyromegaly present.  No cervical lymphadenopathy Cardiovascular: Normal rate, regular rhythm and normal heart sounds.  No murmur heard. No carotid bruit .  No edema Pulmonary/Chest: Effort normal and breath sounds normal. No  respiratory distress. No has no wheezes. No rales.  Right axilla: pea sized cyst in right axilla - no tenderness, mobile, ? A couple of palpable LNs inferior Skin: Skin is warm and dry. Not diaphoretic. No rash, erythema Psychiatric: Normal mood and affect. Behavior is normal.       Assessment & Plan:    See Problem List for Assessment and Plan of chronic medical problems.

## 2021-04-10 ENCOUNTER — Ambulatory Visit (INDEPENDENT_AMBULATORY_CARE_PROVIDER_SITE_OTHER): Payer: 59 | Admitting: Internal Medicine

## 2021-04-10 ENCOUNTER — Encounter: Payer: Self-pay | Admitting: Internal Medicine

## 2021-04-10 ENCOUNTER — Other Ambulatory Visit: Payer: Self-pay

## 2021-04-10 VITALS — BP 140/90 | HR 84 | Temp 98.4°F | Ht 62.5 in | Wt 157.2 lb

## 2021-04-10 DIAGNOSIS — E1165 Type 2 diabetes mellitus with hyperglycemia: Secondary | ICD-10-CM

## 2021-04-10 DIAGNOSIS — I10 Essential (primary) hypertension: Secondary | ICD-10-CM

## 2021-04-10 DIAGNOSIS — L729 Follicular cyst of the skin and subcutaneous tissue, unspecified: Secondary | ICD-10-CM | POA: Insufficient documentation

## 2021-04-10 DIAGNOSIS — Z794 Long term (current) use of insulin: Secondary | ICD-10-CM

## 2021-04-10 DIAGNOSIS — E039 Hypothyroidism, unspecified: Secondary | ICD-10-CM | POA: Diagnosis not present

## 2021-04-10 NOTE — Assessment & Plan Note (Signed)
Chronic  Clinically euthyroid Currently taking levothyroxine 137 mcg daily Check tsh in 4 week since she has been out of medication for one week  - will pick up rx today Titrate med dose if needed

## 2021-04-10 NOTE — Patient Instructions (Addendum)
°  Blood work was ordered.  Have this done in 4 weeks.    Medications changes include :   none   Please followup in 4 months   Dr Hilar[facility] --- Phone: [phone] - colonoscopy due after 05/30/21

## 2021-04-10 NOTE — Assessment & Plan Note (Signed)
Chronic BP Readings from Last 3 Encounters:  04/10/21 140/90  03/02/21 120/74  02/09/21 120/72   BP well controlled Continue maxzide 75-50 mg daily cmp

## 2021-04-10 NOTE — Assessment & Plan Note (Signed)
Right axilla No change in past month or so Likely sebaceous cyst - no change, nontender - will get [place] to confirm so she does not worry about this ? Palpable LNs inferior to cyst [place] of axilla

## 2021-04-10 NOTE — Assessment & Plan Note (Signed)
Chronic Check a1c  Continue ozempic 1 mg weekly - will transition to trulicity in a month or two Continue actos 30 mg daily and metformin 1000 mg bid

## 2021-04-21 ENCOUNTER — Telehealth: Payer: Self-pay

## 2021-04-21 NOTE — Telephone Encounter (Signed)
Pt calling to check on the status of the referral for the lump under her arm.   As no one has reach out to the pt for scheduling as of yet.  CB [phone]

## 2021-04-22 ENCOUNTER — Ambulatory Visit: Payer: 59 | Admitting: Internal Medicine

## 2021-04-24 ENCOUNTER — Other Ambulatory Visit: Payer: Self-pay | Admitting: Internal Medicine

## 2021-04-24 DIAGNOSIS — L729 Follicular cyst of the skin and subcutaneous tissue, unspecified: Secondary | ICD-10-CM

## 2021-05-12 LAB — HM COLONOSCOPY

## 2021-05-21 ENCOUNTER — Other Ambulatory Visit: Payer: 59

## 2021-05-22 ENCOUNTER — Other Ambulatory Visit: Payer: 59

## 2021-05-26 ENCOUNTER — Other Ambulatory Visit: Payer: 59

## 2021-05-26 ENCOUNTER — Telehealth: Payer: Self-pay | Admitting: Internal Medicine

## 2021-05-26 DIAGNOSIS — M2550 Pain in unspecified joint: Secondary | ICD-10-CM

## 2021-05-26 NOTE — Telephone Encounter (Signed)
Patient calling in  Says new rheumatology referral is needed bc she was not able to make last appt due to being OOT for fathers funeral (01/2021)  Please let patient know when new referral has been placed [phone]

## 2021-05-29 NOTE — Telephone Encounter (Signed)
ordered

## 2021-06-04 ENCOUNTER — Other Ambulatory Visit: Payer: Self-pay | Admitting: Internal Medicine

## 2021-06-05 ENCOUNTER — Telehealth: Payer: Self-pay | Admitting: Internal Medicine

## 2021-06-05 NOTE — Telephone Encounter (Signed)
Pt states [facility] is not in her insurance network ? ?Pt requesting referral to [facility] ? ?Fax [phone] ?

## 2021-06-08 ENCOUNTER — Ambulatory Visit: Payer: Self-pay | Admitting: Surgery

## 2021-06-08 DIAGNOSIS — L723 Sebaceous cyst: Secondary | ICD-10-CM | POA: Insufficient documentation

## 2021-06-08 DIAGNOSIS — D171 Benign lipomatous neoplasm of skin and subcutaneous tissue of trunk: Secondary | ICD-10-CM | POA: Insufficient documentation

## 2021-06-08 NOTE — H&P (Signed)
?  ?Subjective  ?  ?Chief Complaint: Lipoma ?  ?  ?  ?History of Present Illness: ?Sylvia Conner is a 66 y.o. female who is seen today as an office consultation at the request of Dr. Pasty Arch for evaluation of Lipoma ?.   ?  ?This is a 66 year old female who was initially evaluated in 2019 and 2022..  She presented with more than 10-year history of enlarging masses in her right upper lateral arm as well as her right lateral chest.  These have caused increasing discomfort.  They have enlarged in size.  The mass in the right arm causes some pain radiating down towards her hand.  She has also developed a small superficial lump in her right axilla. ?  ?We had previously recommended surgery to excise both of these areas.  She has had financial reasons both times that kept her from scheduling surgery.  She presents now to have all 3 of these areas removed. ?  ?Review of Systems: ?A complete review of systems was obtained from the patient.  I have reviewed this information and discussed as appropriate with the patient.  See HPI as well for other ROS. ?  ?Review of Systems  ?Constitutional: Negative.   ?HENT: Negative.   ?Eyes: Negative.   ?Respiratory: Negative.   ?Cardiovascular: Negative.   ?Gastrointestinal: Negative.   ?Genitourinary: Negative.   ?Musculoskeletal: Negative.   ?Skin: Negative.   ?Neurological: Negative.   ?Endo/Heme/Allergies: Negative.   ?Psychiatric/Behavioral: Negative.   ?  ?  ?  ?Medical History: ?Past Medical History  ?    ?Past Medical History:  ?Diagnosis Date  ? Diabetes mellitus without complication (CMS-HCC)    ? Hypertension    ?  ?  ?  ?   ?Patient Active Problem List  ?Diagnosis  ? Lipoma of right upper extremity  ? Lipoma of lateral chest wall  ? Sebaceous cyst of right axilla  ?  ?  ?Past Surgical History  ?History reviewed. No pertinent surgical history.  ?  ?  ?Allergies  ?     ?Allergies  ?Allergen Reactions  ? Penicillins Anaphylaxis and Rash  ?    Has patient had a PCN reaction causing  immediate rash, facial/tongue/throat swelling, SOB or lightheadedness with hypotension: Yes ?Has patient had a PCN reaction causing severe rash involving mucus membranes or skin necrosis: No ?Has patient had a PCN reaction that required hospitalization Yes ?Has patient had a PCN reaction occurring within the last 10 years: No ?If all of the above answers are "NO", then may proceed with Cephalosporin use. ?Has patient had a PCN reaction causing immediate rash, facial/tongue/throat swelling, SOB or lightheadedness with hypotension: Yes ?Has patient had a PCN reaction causing severe rash involving mucus membranes or skin necrosis: No ?Has patient had a PCN reaction that required hospitalization Yes ?Has patient had a PCN reaction occurring within the last 10 years: No ?If all of the above answers are "NO", then may proceed with Cephalosporin use. ?   ? Glipizide Unknown  ?    Breakout on arms  ? Sitagliptin Nausea  ? Sulfa (Sulfonamide Antibiotics) Swelling  ? Shellfish Containing Products Hives  ?  ?  ?  ?      ?Current Outpatient Medications on File Prior to Visit  ?Medication Sig Dispense Refill  ? triamterene-hydrochlorothiazide (MAXZIDE) 75-50 mg tablet Take 1 tablet by mouth every morning      ? levothyroxine (SYNTHROID) 137 MCG tablet TAKE 1 TABLET BY MOUTH ONCE DAILY BEFORE  BREAKFAST      ? metFORMIN (GLUCOPHAGE) 1000 MG tablet TAKE 1 TABLET BY MOUTH TWICE DAILY WITH A MEAL      ?  ?No current facility-administered medications on file prior to visit.  ?  ?  ?Family History  ?     ?Family History  ?Problem Relation Age of Onset  ? High blood pressure (Hypertension) Mother    ? Diabetes Mother    ? Diabetes Sister    ? Diabetes Paternal Grandmother    ?  ?  ?  ?Social History  ?  ?   ?Tobacco Use  ?Smoking Status Never  ?Smokeless Tobacco Never  ?  ?  ?Social History  ?Social History  ?  ?    ?Socioeconomic History  ? Marital status: Married  ?Tobacco Use  ? Smoking status: Never  ? Smokeless tobacco: Never   ?Substance and Sexual Activity  ? Alcohol use: Never  ? Drug use: Never  ?  ?  ?  ?Objective:  ?  ?  ?   ?Vitals:  ?  06/08/21 0934  ?BP: (!) 150/84  ?Pulse: 105  ?Temp: 36.7 ?C (98 ?F)  ?SpO2: 97%  ?Weight: 70.5 kg (155 lb 6.4 oz)  ?Height: 158.8 cm (5' 2.5")  ?  ?Body mass index is 27.97 kg/m?. ?  ?Physical Exam  ?  ?Constitutional:  WDWN in NAD, conversant, no obvious deformities; lying in bed comfortably ?Eyes:  Pupils equal, round; sclera anicteric; moist conjunctiva; no lid lag ?HENT:  Oral mucosa moist; good dentition  ?Neck:  No masses palpated, trachea midline; no thyromegaly ?Lungs:  CTA bilaterally; normal respiratory effort ?Right lateral chest -oval-shaped 3 cm subcutaneous mass, smooth, well demarcated, mildly tender ?Right lateral upper arm shows a protruding 5 cm subcutaneous mass that is smooth, well demarcated, mildly tender ?Right axilla shows a 1 cm sebaceous cyst with no signs of active infection. ?CV:  Regular rate and rhythm; no murmurs; extremities well-perfused with no edema ?Abd:  +bowel sounds, soft, non-tender, no palpable organomegaly; no palpable hernias ?Musc:  Unable to assess gait; no apparent clubbing or cyanosis in extremities ?Lymphatic:  No palpable cervical or axillary lymphadenopathy ?Skin:  Warm, dry; no sign of jaundice ?Psychiatric - alert and oriented x 4; calm mood and affect ?  ?  ?  ?Assessment and Plan:  ?Diagnoses and all orders for this visit: ?  ?Lipoma of right upper extremity ?  ?Lipoma of lateral chest wall ?  ?Sebaceous cyst of right axilla ?  ?  ?  ?Excision of subcutaneous lipoma of the right upper arm, right lateral chest, and sebaceous cyst of the right axilla. ?  ?  ?The surgical procedure has been discussed with the patient.  Potential risks, benefits, alternative treatments, and expected outcomes have been explained.  All of the patient's questions at this time have been answered.  The likelihood of reaching the patient's treatment goal is good.  The  patient understand the proposed surgical procedure and wishes to proceed. ? ?Imogene Burn. Norman Piacentini, MD, FACS ?[facility]  ?General Surgery ? ? ?06/08/2021 ?9:47 AM ? ?

## 2021-06-21 ENCOUNTER — Other Ambulatory Visit: Payer: Self-pay | Admitting: Internal Medicine

## 2021-06-28 ENCOUNTER — Encounter: Payer: Self-pay | Admitting: Internal Medicine

## 2021-06-28 NOTE — Progress Notes (Signed)
? ? ? ? ?Subjective:  ? ? Patient ID: Sylvia Conner, female    DOB: 04-18-1955, 66 y.o.   MRN: [id number] ? ?This visit occurred during the SARS-CoV-2 public health emergency.  Safety protocols were in place, including screening questions prior to the visit, additional usage of staff PPE, and extensive cleaning of exam room while observing appropriate contact time as indicated for disinfecting solutions.   ? ? ?HPI ?[name] is here for follow up of her chronic medical problems, including DM, htn, hypothyroidism, hld ? ?On trulicity - getting it through a pt assistance which she did on her own.  This is not working.  Not sure what dose she is on.  Did better with ozempic.  Having yeast infections, tingling in feet.  She did take something over-the-counter for the yeast infection and feels it is better.  She is trying to get the paperwork for patient assistance for the Ozempic. ? ? ?She is trying to watch her sugars and carbs - not drinking any sugary drinks - only drinking cyrstal light and water.   She trying to keep her portions of potatoes and rice small.   ? ?She is walking.  ? ?Last few sugars - 180, 234 ( had eaten a potato), 194, 120 ? ? ?Medications and allergies reviewed with patient and updated if appropriate. ? ?Current Outpatient Medications on File Prior to Visit  ?Medication Sig Dispense Refill  ? blood glucose meter kit and supplies KIT Dispense based on patient and insurance preference. Use up to four times daily as directed. (FOR E11.9). 1 each 0  ? levothyroxine (SYNTHROID) 137 MCG tablet Take 1 tablet (137 mcg total) by mouth daily before breakfast. 90 tablet 1  ? meloxicam (MOBIC) 15 MG tablet Take 1 tablet (15 mg total) by mouth daily. (Patient taking differently: Take 15 mg by mouth daily as needed.) 20 tablet 0  ? metFORMIN (GLUCOPHAGE) 1000 MG tablet TAKE 1 TABLET BY MOUTH TWICE DAILY WITH A MEAL 180 tablet 0  ? Multiple Vitamins-Minerals (HAIR SKIN AND NAILS FORMULA PO) Take 1 tablet by mouth  daily.    ? pioglitazone (ACTOS) 30 MG tablet Take 1 tablet (30 mg total) by mouth daily. 30 tablet 5  ? potassium chloride SA (KLOR-CON) 20 MEQ tablet Take 2 tablets (40 mEq total) by mouth daily. 180 tablet 1  ? rosuvastatin (CRESTOR) 10 MG tablet Take 1 tablet (10 mg total) by mouth daily. 90 tablet 3  ? Semaglutide, 1 MG/DOSE, (OZEMPIC, 1 MG/DOSE,) 2 MG/1.5ML SOPN Inject 1 mg into the skin once a week. 9 mL 3  ? triamcinolone cream (KENALOG) 0.5 % Apply topically 2 (two) times daily as needed. 30 g 1  ? triamterene-hydrochlorothiazide (MAXZIDE) 75-50 MG tablet TAKE 1 TABLET BY MOUTH ONCE DAILY IN THE MORNING 90 tablet 0  ? ?No current facility-administered medications on file prior to visit.  ? ? ? ?Review of Systems  ?Constitutional:  Negative for chills and fever.  ?Respiratory:  Negative for cough, shortness of breath and wheezing.   ?Cardiovascular:  Negative for chest pain, palpitations and leg swelling.  ?Neurological:  Negative for light-headedness, numbness (tingling in feet) and headaches.  ? ?   ?Objective:  ? ?Vitals:  ? 06/30/21 0851  ?BP: (!) 144/84  ?Pulse: 86  ?Temp: 98.6 ?F (37 ?C)  ?SpO2: 99%  ? ?BP Readings from Last 3 Encounters:  ?06/30/21 (!) 144/84  ?04/10/21 140/90  ?03/02/21 120/74  ? ?Wt Readings from Last 3 Encounters:  ?06/30/21  153 lb 12.8 oz (69.8 kg)  ?04/10/21 157 lb 3.2 oz (71.3 kg)  ?03/02/21 150 lb (68 kg)  ? ?Body mass index is 27.68 kg/m?. ? ?  ?Physical Exam ?Constitutional:   ?   General: She is not in acute distress. ?   Appearance: Normal appearance.  ?HENT:  ?   Head: Normocephalic and atraumatic.  ?Eyes:  ?   Conjunctiva/sclera: Conjunctivae normal.  ?Cardiovascular:  ?   Rate and Rhythm: Normal rate and regular rhythm.  ?   Heart sounds: Normal heart sounds. No murmur heard. ?Pulmonary:  ?   Effort: Pulmonary effort is normal. No respiratory distress.  ?   Breath sounds: Normal breath sounds. No wheezing.  ?Musculoskeletal:  ?   Cervical back: Neck supple.  ?   Right  lower leg: No edema.  ?   Left lower leg: No edema.  ?Lymphadenopathy:  ?   Cervical: No cervical adenopathy.  ?Skin: ?   Findings: No rash.  ?Neurological:  ?   Mental Status: She is alert. Mental status is at baseline.  ?Psychiatric:     ?   Mood and Affect: Mood normal.     ?   Behavior: Behavior normal.  ? ?   ? ?Lab Results  ?Component Value Date  ? WBC 5.0 10/28/2020  ? HGB 11.9 (L) 10/28/2020  ? HCT 39.4 10/28/2020  ? PLT 325 10/28/2020  ? GLUCOSE 169 (H) 01/12/2021  ? CHOL 231 (H) 01/12/2021  ? TRIG 155.0 (H) 01/12/2021  ? HDL 74.10 01/12/2021  ? LDLCALC 126 (H) 01/12/2021  ? ALT 12 01/12/2021  ? AST 21 01/12/2021  ? NA 135 01/12/2021  ? K 3.4 (L) 01/12/2021  ? CL 97 01/12/2021  ? CREATININE 0.85 01/12/2021  ? BUN 10 01/12/2021  ? CO2 30 01/12/2021  ? TSH 7.78 (H) 01/12/2021  ? HGBA1C 10.6 (H) 01/12/2021  ? MICROALBUR 0.8 01/12/2021  ? ? ? ?Assessment & Plan:  ? ? ?See Problem List for Assessment and Plan of chronic medical problems.  ? ? ?

## 2021-06-28 NOTE — Patient Instructions (Addendum)
? ? ? ?  Blood work was ordered.   ? ? ? ?Medications changes include :  none  ? ? ? ? ?Return in about 4 months (around 10/30/2021) for follow up. ? ?

## 2021-06-29 NOTE — Progress Notes (Deleted)
? ?Office Visit Note ? ?Patient: Sylvia Conner             ?Date of Birth: 01/26/1956           ?MRN: [id number]             ?PCP: Binnie Rail, MD ?Referring: Binnie Rail, MD ?Visit Date: 07/09/2021 ?Occupation: _0 @ ? ?Subjective:  ?No chief complaint on file. ? ? ?History of Present Illness: Sylvia Conner is a 66 y.o. female ***returns today after her initial visit on September 12, 2020 at that time she presented with right shoulder joint pain and occasional discomfort in her left knee.  The shoulder pain had been intermittent since 2019.   ? ?Activities of Daily Living:  ?Patient reports morning stiffness for *** {minute/hour:19697}.   ?Patient {ACTIONS;DENIES/REPORTS:21021675::"Denies"} nocturnal pain.  ?Difficulty dressing/grooming: {ACTIONS;DENIES/REPORTS:21021675::"Denies"} ?Difficulty climbing stairs: {ACTIONS;DENIES/REPORTS:21021675::"Denies"} ?Difficulty getting out of chair: {ACTIONS;DENIES/REPORTS:21021675::"Denies"} ?Difficulty using hands for taps, buttons, cutlery, and/or writing: {ACTIONS;DENIES/REPORTS:21021675::"Denies"} ? ?No Rheumatology ROS completed.  ? ?PMFS History:  ?Patient Active Problem List  ? Diagnosis Date Noted  ? Skin cyst 04/10/2021  ? PND (post-nasal drip) 03/02/2021  ? Breast pain, right 08/18/2020  ? Myalgia 06/03/2020  ? Arthralgia 06/03/2020  ? Right-sided chest pain 06/03/2020  ? Constipation 12/27/2019  ? Pruritus 08/08/2019  ? Episcleritis of left eye 06/21/2019  ? Hyperlipidemia 11/30/2018  ? Lipoma of arm 05/03/2017  ? Eczema, feet 12/24/2016  ? Left lower quadrant pain 05/04/2016  ? Right shoulder pain 10/31/2015  ? Adnexal mass 10/31/2015  ? HTN (hypertension) 05/24/2011  ? DM (diabetes mellitus) ([place]) 05/24/2011  ? Hypothyroidism 05/24/2011  ? History of hysterectomy 05/24/2011  ?  ?Past Medical History:  ?Diagnosis Date  ? Anemia   ? Arthritis   ? Diabetes mellitus   ? Hypertension   ? Thyroid disease   ? UTI (lower urinary tract infection)   ?  ?Family History   ?Problem Relation Age of Onset  ? Hypertension Mother   ? Diabetes Mother   ? Diabetes Sister   ? Diabetes Paternal Grandmother   ? Diabetes Other   ? Healthy Daughter   ? Healthy Daughter   ? ?Past Surgical History:  ?Procedure Laterality Date  ? CESAREAN SECTION    ? COLONOSCOPY    ? PARTIAL HYSTERECTOMY    ? 1995  ? ?Social History  ? ?Social History Narrative  ? Not on file  ? ?Immunization History  ?Administered Date(s) Administered  ? Influenza,inj,Quad PF,6+ Mos 12/24/2016  ? Influenza-Unspecified 01/11/2015, 01/14/2021  ? PFIZER(Purple Top)SARS-COV-2 Vaccination 08/09/2019, 08/30/2019, 03/06/2020  ? Pneumococcal-Unspecified 02/09/2013  ? Td 06/03/2000  ? Tdap 04/30/2014, 08/31/2016  ?  ? ?Objective: ?Vital Signs: There were no vitals taken for this visit.  ? ?Physical Exam  ? ?Musculoskeletal Exam: *** ? ?CDAI Exam: ?CDAI Score: -- ?Patient Global: --; Provider Global: -- ?Swollen: --; Tender: -- ?Joint Exam 07/09/2021  ? ?No joint exam has been documented for this visit  ? ?There is currently no information documented on the homunculus. Go to the Rheumatology activity and complete the homunculus joint exam. ? ?Investigation: ?No additional findings. ? ?Imaging: ?No results found. ? ?Recent Labs: ?Lab Results  ?Component Value Date  ? WBC 5.0 10/28/2020  ? HGB 11.9 (L) 10/28/2020  ? PLT 325 10/28/2020  ? NA 135 01/12/2021  ? K 3.4 (L) 01/12/2021  ? CL 97 01/12/2021  ? CO2 30 01/12/2021  ? GLUCOSE 169 (H) 01/12/2021  ? BUN 10 01/12/2021  ?  CREATININE 0.85 01/12/2021  ? BILITOT 0.8 01/12/2021  ? ALKPHOS 101 01/12/2021  ? AST 21 01/12/2021  ? ALT 12 01/12/2021  ? PROT 8.1 01/12/2021  ? ALBUMIN 4.3 01/12/2021  ? CALCIUM 9.9 01/12/2021  ? GFRAA >60 04/22/2016  ? ?September 12, 2020 ANA 1: 80NH, ENA negative, C3-C4 normal, ESR 9, CK1 27, uric acid 3.9 ? ?06/03/20: ANA 1:320NH, CRP<1, RF<14, ESR 52, TSH 2.70 ? ?Speciality Comments: No specialty comments available. ? ?Procedures:  ?No procedures performed ?Allergies:  Penicillins, Glipizide, Januvia [sitagliptin], Sulfa antibiotics, and Scallops [shellfish allergy]  ? ?Assessment / Plan:     ?Visit Diagnoses: No diagnosis found. ? ?Orders: ?No orders of the defined types were placed in this encounter. ? ?No orders of the defined types were placed in this encounter. ? ? ?Face-to-face time spent with patient was *** minutes. Greater than 50% of time was spent in counseling and coordination of care. ? ?Follow-Up Instructions: No follow-ups on file. ? ? ?Bo Merino, MD ? ?Note - This record has been created using [employer].  ?Chart creation errors have been sought, but may not always  ?have been located. Such creation errors do not reflect on  ?the standard of medical care. ?

## 2021-06-30 ENCOUNTER — Ambulatory Visit (INDEPENDENT_AMBULATORY_CARE_PROVIDER_SITE_OTHER): Payer: 59 | Admitting: Internal Medicine

## 2021-06-30 ENCOUNTER — Other Ambulatory Visit: Payer: Self-pay

## 2021-06-30 VITALS — BP 144/84 | HR 86 | Temp 98.6°F | Ht 62.5 in | Wt 153.8 lb

## 2021-06-30 DIAGNOSIS — E039 Hypothyroidism, unspecified: Secondary | ICD-10-CM

## 2021-06-30 DIAGNOSIS — Z794 Long term (current) use of insulin: Secondary | ICD-10-CM

## 2021-06-30 DIAGNOSIS — E782 Mixed hyperlipidemia: Secondary | ICD-10-CM | POA: Diagnosis not present

## 2021-06-30 DIAGNOSIS — I10 Essential (primary) hypertension: Secondary | ICD-10-CM

## 2021-06-30 DIAGNOSIS — E1165 Type 2 diabetes mellitus with hyperglycemia: Secondary | ICD-10-CM

## 2021-06-30 LAB — COMPREHENSIVE METABOLIC PANEL
ALT: 31 U/L (ref 0–35)
AST: 30 U/L (ref 0–37)
Albumin: 4.4 g/dL (ref 3.5–5.2)
Alkaline Phosphatase: 112 U/L (ref 39–117)
BUN: 17 mg/dL (ref 6–23)
CO2: 28 mEq/L (ref 19–32)
Calcium: 9.7 mg/dL (ref 8.4–10.5)
Chloride: 97 mEq/L (ref 96–112)
Creatinine, Ser: 0.75 mg/dL (ref 0.40–1.20)
GFR: 84.19 mL/min (ref >60.00)
Glucose, Bld: 167 mg/dL — ABNORMAL HIGH (ref 70–99)
Potassium: 3.4 mEq/L — ABNORMAL LOW (ref 3.5–5.1)
Sodium: 135 mEq/L (ref 135–145)
Total Bilirubin: 0.6 mg/dL (ref 0.2–1.2)
Total Protein: 8.2 g/dL (ref 6.0–8.3)

## 2021-06-30 LAB — LIPID PANEL
Cholesterol: 231 mg/dL — ABNORMAL HIGH (ref 0–200)
HDL: 87.2 mg/dL (ref >39.00)
LDL Cholesterol: 128 mg/dL — ABNORMAL HIGH (ref 0–99)
NonHDL: 143.61
Total CHOL/HDL Ratio: 3
Triglycerides: 79 mg/dL (ref 0.0–149.0)
VLDL: 15.8 mg/dL (ref 0.0–40.0)

## 2021-06-30 LAB — CBC WITH DIFFERENTIAL/PLATELET
Basophils Absolute: 0 10*3/uL (ref 0.0–0.1)
Basophils Relative: 0.9 % (ref 0.0–3.0)
Eosinophils Absolute: 0.1 10*3/uL (ref 0.0–0.7)
Eosinophils Relative: 3 % (ref 0.0–5.0)
HCT: 38.4 % (ref 36.0–46.0)
Hemoglobin: 12.4 g/dL (ref 12.0–15.0)
Lymphocytes Relative: 36 % (ref 12.0–46.0)
Lymphs Abs: 1.7 10*3/uL (ref 0.7–4.0)
MCHC: 32.2 g/dL (ref 30.0–36.0)
MCV: 74.7 fl — ABNORMAL LOW (ref 78.0–100.0)
Monocytes Absolute: 0.3 10*3/uL (ref 0.1–1.0)
Monocytes Relative: 7.2 % (ref 3.0–12.0)
Neutro Abs: 2.5 10*3/uL (ref 1.4–7.7)
Neutrophils Relative %: 52.9 % (ref 43.0–77.0)
Platelets: 309 10*3/uL (ref 150.0–400.0)
RBC: 5.14 Mil/uL — ABNORMAL HIGH (ref 3.87–5.11)
RDW: 13.1 % (ref 11.5–15.5)
WBC: 4.7 10*3/uL (ref 4.0–10.5)

## 2021-06-30 LAB — TSH: TSH: 0.03 u[IU]/mL — ABNORMAL LOW (ref 0.35–5.50)

## 2021-06-30 LAB — HEMOGLOBIN A1C: Hgb A1c MFr Bld: 8 % — ABNORMAL HIGH (ref 4.6–6.5)

## 2021-06-30 NOTE — Assessment & Plan Note (Addendum)
Chronic ?Blood pressure slightly elevated  ?Continue low-sodium diet, regular exercise ?CMP ?Continue triamterene-HCTZ 75-50 mg daily ?Blood pressure remains elevated at her next visit we will need to add another medication ?

## 2021-06-30 NOTE — Assessment & Plan Note (Signed)
Chronic  Clinically euthyroid Currently taking levothyroxine 137 mcg daily Check tsh  Titrate med dose if needed 

## 2021-06-30 NOTE — Assessment & Plan Note (Addendum)
Chronic ?Lab Results  ?Component Value Date  ? HGBA1C 10.6 (H) 01/12/2021  ? ?Sugars not controlled ?Check A1c ?Continue, Actos 30 mg daily, metformin 1000 mg twice daily ?Currently on Trulicity-she is not sure what dose-this is from patient assistance that she has done on her own.  She is trying to get the Ozempic patient assistance form so that she can go back on Ozempic, which worked much better ?Stressed regular exercise, diabetic diet ? ?We will request chronic care management-RN and pharmacy consult.  Hopefully our pharmacist can help with patient assistance for Ozempic ? ?

## 2021-06-30 NOTE — Assessment & Plan Note (Signed)
Chronic Regular exercise and healthy diet encouraged Check lipid panel  Continue Crestor 10 mg daily 

## 2021-07-03 ENCOUNTER — Encounter: Payer: Self-pay | Admitting: Internal Medicine

## 2021-07-03 ENCOUNTER — Other Ambulatory Visit: Payer: Self-pay | Admitting: Internal Medicine

## 2021-07-03 MED ORDER — LEVOTHYROXINE SODIUM 137 MCG PO TABS: 137.0000 ug | ORAL_TABLET | Freq: Every day | ORAL | 1 refills | Status: DC

## 2021-07-03 NOTE — Progress Notes (Signed)
Outside notes received. Information abstracted. Notes sent to scan.  

## 2021-07-06 ENCOUNTER — Telehealth: Payer: Self-pay

## 2021-07-06 ENCOUNTER — Encounter: Payer: Self-pay | Admitting: *Deleted

## 2021-07-06 NOTE — Telephone Encounter (Signed)
Dismissal letter written and mailed. Dismissed in [place].  ?

## 2021-07-06 NOTE — Telephone Encounter (Signed)
Patient called stating she had to cancel her appointment scheduled for tomorrow, 07/07/21 due to her work schedule.  I told patient that I was unable to reschedule.   ? ?On appointment note it stated (NPT f/u (if pt. cancels or no shows, do not reschedule) ?

## 2021-07-07 ENCOUNTER — Telehealth: Payer: Self-pay

## 2021-07-07 NOTE — Telephone Encounter (Signed)
Message left for patient this morning. ? ?Patient assistance form ready and left up front for pick up. ?

## 2021-07-09 ENCOUNTER — Telehealth: Payer: Self-pay | Admitting: *Deleted

## 2021-07-09 ENCOUNTER — Ambulatory Visit: Payer: 59 | Admitting: Rheumatology

## 2021-07-09 DIAGNOSIS — L309 Dermatitis, unspecified: Secondary | ICD-10-CM

## 2021-07-09 DIAGNOSIS — M5030 Other cervical disc degeneration, unspecified cervical region: Secondary | ICD-10-CM

## 2021-07-09 DIAGNOSIS — M19042 Primary osteoarthritis, left hand: Secondary | ICD-10-CM

## 2021-07-09 DIAGNOSIS — Z8639 Personal history of other endocrine, nutritional and metabolic disease: Secondary | ICD-10-CM

## 2021-07-09 DIAGNOSIS — M791 Myalgia, unspecified site: Secondary | ICD-10-CM

## 2021-07-09 DIAGNOSIS — E039 Hypothyroidism, unspecified: Secondary | ICD-10-CM

## 2021-07-09 DIAGNOSIS — E1165 Type 2 diabetes mellitus with hyperglycemia: Secondary | ICD-10-CM

## 2021-07-09 DIAGNOSIS — I10 Essential (primary) hypertension: Secondary | ICD-10-CM

## 2021-07-09 DIAGNOSIS — H5789 Other specified disorders of eye and adnexa: Secondary | ICD-10-CM

## 2021-07-09 DIAGNOSIS — R768 Other specified abnormal immunological findings in serum: Secondary | ICD-10-CM

## 2021-07-09 DIAGNOSIS — G8929 Other chronic pain: Secondary | ICD-10-CM

## 2021-07-09 DIAGNOSIS — M1712 Unilateral primary osteoarthritis, left knee: Secondary | ICD-10-CM

## 2021-07-09 DIAGNOSIS — D1721 Benign lipomatous neoplasm of skin and subcutaneous tissue of right arm: Secondary | ICD-10-CM

## 2021-07-09 DIAGNOSIS — M5136 Other intervertebral disc degeneration, lumbar region: Secondary | ICD-10-CM

## 2021-07-09 NOTE — Chronic Care Management (AMB) (Signed)
?  Care Management  ? ?Note ? ?07/09/2021 ?Name: Sylvia Conner MRN: [id number] DOB: [birth year] ? ?[name] is a 66 y.o. year old female who is a primary care patient of Burns, Claudina Lick, MD. I reached out to [place] by phone today offer care coordination services.  ? ?[name] was given information about care management services today including:  ?Care management services include personalized support from designated clinical staff supervised by her physician, including individualized plan of care and coordination with other care providers ?24/7 contact phone numbers for assistance for urgent and routine care needs. ?The patient may stop care management services at any time by phone call to the office staff. ? ?Patient agreed to services and verbal consent obtained.  ? ?Follow up plan: ?Telephone appointment with care management team member scheduled for:07/13/21 ? ?Laverda Sorenson  ?Care Guide, Embedded Care Coordination ?[facility]  Care Management  ?Direct Dial: [phone] ? ?

## 2021-07-13 ENCOUNTER — Ambulatory Visit: Payer: 59 | Admitting: *Deleted

## 2021-07-13 DIAGNOSIS — E1165 Type 2 diabetes mellitus with hyperglycemia: Secondary | ICD-10-CM

## 2021-07-13 NOTE — Chronic Care Management (AMB) (Signed)
Care Management    RN Visit Note  07/13/2021 Name: Sylvia Conner MRN: [id number] DOB: Dec 11, 1955  Subjective: Sylvia Conner is a 66 y.o. year old female who is a primary care patient of Burns, Bobette Mo, MD. The care management team was consulted for assistance with disease management and care coordination needs.    Engaged with patient by telephone for initial visit in response to provider referral for case management and/or care coordination services.   Consent to Services:   Sylvia Conner was given information about Care Management services 07/09/21 including:  Care Management services includes personalized support from designated clinical staff supervised by her physician, including individualized plan of care and coordination with other care providers 24/7 contact phone numbers for assistance for urgent and routine care needs. The patient may stop case management services at any time by phone call to the office staff.  Patient agreed to services and consent obtained.   Assessment: Review of patient past medical history, allergies, medications, health status, including review of consultants reports, laboratory and other test data, was performed as part of comprehensive evaluation and provision of chronic care management services.   SDOH (Social Determinants of Health) assessments and interventions performed:  SDOH Interventions    Flowsheet Row Most Recent Value  SDOH Interventions   Food Insecurity Interventions Intervention Not Indicated  Housing Interventions Intervention Not Indicated  [single family one level home x 27 years,  lives with spouse,  denies safety concerns around housing,  1 step off front entry/ 6 steps off back entry]  Transportation Interventions Intervention Not Indicated  [Drives self]     Care Plan  Allergies  Allergen Reactions   Penicillins Anaphylaxis and Rash    Has patient had a PCN reaction causing immediate rash, facial/tongue/throat swelling, SOB or  lightheadedness with hypotension: Yes Has patient had a PCN reaction causing severe rash involving mucus membranes or skin necrosis: No Has patient had a PCN reaction that required hospitalization Yes Has patient had a PCN reaction occurring within the last 10 years: No If all of the above answers are "NO", then may proceed with Cephalosporin use.    Glipizide     Breakout on arms   Januvia [Sitagliptin] Nausea Only   Sulfa Antibiotics Swelling   Scallops [Shellfish Allergy] Hives   Outpatient Encounter Medications as of 07/13/2021  Medication Sig   blood glucose meter kit and supplies KIT Dispense based on patient and insurance preference. Use up to four times daily as directed. (FOR E11.9).   levothyroxine (SYNTHROID) 137 MCG tablet Take 1 tablet (137 mcg total) by mouth daily before breakfast. 6 days a week.  Take 0.5 tablet PO one day a week   meloxicam (MOBIC) 15 MG tablet Take 1 tablet (15 mg total) by mouth daily. (Patient taking differently: Take 15 mg by mouth daily as needed.)   metFORMIN (GLUCOPHAGE) 1000 MG tablet TAKE 1 TABLET BY MOUTH TWICE DAILY WITH A MEAL   Multiple Vitamins-Minerals (HAIR SKIN AND NAILS FORMULA PO) Take 1 tablet by mouth daily.   pioglitazone (ACTOS) 30 MG tablet Take 1 tablet (30 mg total) by mouth daily.   potassium chloride SA (KLOR-CON) 20 MEQ tablet Take 2 tablets (40 mEq total) by mouth daily.   rosuvastatin (CRESTOR) 10 MG tablet Take 1 tablet (10 mg total) by mouth daily.   Semaglutide, 1 MG/DOSE, (OZEMPIC, 1 MG/DOSE,) 2 MG/1.5ML SOPN Inject 1 mg into the skin once a week.   triamcinolone cream (KENALOG) 0.5 % Apply topically  2 (two) times daily as needed.   triamterene-hydrochlorothiazide (MAXZIDE) 75-50 MG tablet TAKE 1 TABLET BY MOUTH ONCE DAILY IN THE MORNING   No facility-administered encounter medications on file as of 07/13/2021.   Patient Active Problem List   Diagnosis Date Noted   Skin cyst 04/10/2021   PND (post-nasal drip) 03/02/2021    Breast pain, right 08/18/2020   Arthralgia 06/03/2020   Right-sided chest pain 06/03/2020   Constipation 12/27/2019   Pruritus 08/08/2019   Episcleritis of left eye 06/21/2019   Hyperlipidemia 11/30/2018   Lipoma of arm 05/03/2017   Eczema, feet 12/24/2016   Left lower quadrant pain 05/04/2016   Right shoulder pain 10/31/2015   Adnexal mass 10/31/2015   HTN (hypertension) 05/24/2011   DM (diabetes mellitus) (HCC) 05/24/2011   Hypothyroidism 05/24/2011   History of hysterectomy 05/24/2011   Conditions to be addressed/monitored: DMII  Care Plan : RN Care Manager Plan of Care  Updates made by Michaela Corner, RN since 07/13/2021 12:00 AM     Problem: Chronic Disease Management Needs   Priority: High     Long-Range Goal: Development of plan of care for long term chronic disease management   Start Date: 07/13/2021  Expected End Date: 07/14/2022  Priority: High  Note:   Current Barriers:  Chronic Disease Management support and education needs related to DMII  RNCM Clinical Goal(s):  Patient will demonstrate ongoing health management independence as evidenced by adherence to plan of care for self-management of DMII        through collaboration with RN Care manager, provider, and care team.   Interventions: 1:1 collaboration with primary care provider regarding development and update of comprehensive plan of care as evidenced by provider attestation and co-signature Inter-disciplinary care team collaboration (see longitudinal plan of care) Evaluation of current treatment plan related to  self management and patient's adherence to plan as established by provider Review of patient status, including review of consultants reports, relevant laboratory and other test results, and medications completed RN CM Initial assessment completed 07/13/21 SDOH assessment completed: no unmet concerns identified Depression screening completed: no concerns identified Pain assessment updated: reports  occasional intermittent (L) knee pain due to OA, well controlled with OTC ibuprofen: denies pain today Falls assessment updated: denies new/ recent falls x 12 months- currently not using assistive devices;  positive reinforcement provided with encouragement to continue efforts at fall prevention;  Medications discussed:  reports independently self-manage and denies current concerns/ issues/ questions around medications; endorses adherence to taking all medications as prescribed Discussed need for PAP for Ozempic: she is currently in process of applying for PAP; waiting to hear back/ forms have been mailed Patient tells me she has reached out to her insurance provider and was informed that they will cover Ozempic, as long as it is designated for use for DMII: patient requestes new Rx from PCP to be called in to her outpatient pharmacy; verified outpatient pharmacy-- will make this request with Dr. Lawerance Bach Confirmed patient currently using samples for Ozempic which were provided to her at time of last PCP office visit 06/30/21 Reviewed recent PCP office visit 06/30/21: patient verbalizes good understanding of post-office visit instructions and denies questions Reviewed upcoming scheduled provider appointments: 07/28/21- outpatient surgery for lipoma removal; 10/30/21- PCP; patient confirms is aware of all and has plans to attend as scheduled Discussed plans with patient for ongoing care management follow up and provided patient with direct contact information for care management team     Diabetes:  (  Status: 07/13/21: New goal.) Long Term Goal   Lab Results  Component Value Date   HGBA1C 8.0 (H) 06/30/2021    Assessed patient's understanding of A1c goal: <7% Provided education to patient about basic DM disease process; Reviewed patient's current routine for monitoring blood sugars at home- currently checks QD fasting blood sugars; reports general ranges consistently between 170-250; discussed general goals  for fasting blood sugars-- between 120-130 Provided education around value of alternating daily blood sugar monitoring several times per week, to include 2-hour post-prandial check-- she will start doing this; discussed/ provided education around general post-prandial goals-- less than 180 Confirmed with patient no low blood sugar values at home Discussed simple dietary strategies she could consider as she begins making additional changes; she has already cut out soda's and reads nutritional labels; encouraged her to make small, simple, gradual changes over time for best results: will mail printed educational material Confirmed patient attends annual eye exams- last eye exam reported as July 2022; encouraged patient to ensure that she schedules 2023 annual eye exam accordingly  Patient Goals/Self-Care Activities: As evidenced by review of EHR, collaboration with care team, and patient reporting during CCM RN CM outreach,  Patient Sylvia Conner will: Take medications as prescribed Attend all scheduled provider appointments Call pharmacy for medication refills Call provider office for new concerns or questions Continue to check fasting (first thing in the morning, before eating) blood sugars at home several times each week: your goal for this blood sugar value is between 120-130 Begin to periodically alternate checking your blood sugars 2 hours after eating a regular size meal, several times each week: your goal for this blood sugar value is: less than 180 Make effort to follow heart healthy, low salt, low cholesterol, carbohydrate-modified, low sugar diet: be thinking of strategies you can try at home, as we discussed today, to decrease the amount of carbohydrates and sugar you eat Review enclosed educational material  Keep up the great work preventing falls       Plan:  Telephone follow up appointment with care management team member scheduled for: Wednesday, Aug 12, 2021 at 9:45 am The patient has  been provided with contact information for the care management team and has been advised to call with any health related questions or concerns   Caryl Pina, RN, BSN, CCRN Alumnus [facility] RN Care Coordination- [facility] [place] [phone]: direct office

## 2021-07-13 NOTE — Patient Instructions (Addendum)
Visit Information ? ?Sylvia Conner, thank you for taking time to talk with me today. Please don't hesitate to contact me if I can be of assistance to you before our next scheduled telephone appointment ? ?Below are the goals we discussed today:  ?Patient Self-Care Activities: ?Patient Sylvia Conner will: ?Take medications as prescribed ?Attend all scheduled provider appointments ?Call pharmacy for medication refills ?Call provider office for new concerns or questions ?Continue to check fasting (first thing in the morning, before eating) blood sugars at home several times each week: your goal for this blood sugar value is between 120-130 ?Begin to periodically alternate checking your blood sugars 2 hours after eating a regular size meal, several times each week: your goal for this blood sugar value is: less than 180 ?Make effort to follow heart healthy, low salt, low cholesterol, carbohydrate-modified, low sugar diet: be thinking of strategies you can try at home, as we discussed today, to decrease the amount of carbohydrates and sugar you eat ?Review enclosed educational material  ?Keep up the great work preventing falls ? ?Our next scheduled telephone follow up visit/ appointment is scheduled on: Wednesday, Aug 12, 2021 at 9:45 am- This is a PHONE [place] appointment ? ?If you need to cancel or re-schedule our visit, please call [phone] and our care guide team will be happy to assist you. ?  ?I look forward to hearing about your progress. ?  ?Sylvia Rack, RN, BSN, CCRN Alumnus ?[place] ?([phone]: direct office ? ?If you are experiencing a Mental Health or [place] or need someone to talk to, please  ?call the Suicide and Crisis Lifeline: 988 ?call the [place] National Suicide Prevention Lifeline: 914-066-3400 or TTY: 352 048 2382 TTY 206-751-7291) to talk to a trained counselor ?call 1-800-273-TALK (toll free, 24 hour hotline) ?go to [facility] [address] [place] [phone]) ?call 911  ? ?Following is a copy of your full plan of care:  ?Care Plan : [place] of Care  ?Updates made by Sylvia Royalty, RN since 07/13/2021 12:00 AM  ?  ? ?Problem: Chronic Disease Management Needs   ?Priority: High  ?  ? ?Long-Range Goal: Development of plan of care for long term chronic disease management   ?Start Date: 07/13/2021  ?Expected End Date: 07/14/2022  ?Priority: High  ?Note:   ?Current Barriers:  ?Chronic Disease Management support and education needs related to DMII ? ?RNCM Clinical Goal(s):  ?Patient will demonstrate ongoing health management independence as evidenced by adherence to plan of care for self-management of DMII        through collaboration with RN Care manager, provider, and care team.  ? ?Interventions: ?1:1 collaboration with primary care provider regarding development and update of comprehensive plan of care as evidenced by provider attestation and co-signature ?Inter-disciplinary care team collaboration (see longitudinal plan of care) ?Evaluation of current treatment plan related to  self management and patient's adherence to plan as established by provider ?Review of patient status, including review of consultants reports, relevant laboratory and other test results, and medications completed ?RN CM Initial assessment completed 07/13/21 ?SDOH assessment completed: no unmet concerns identified ?Depression screening completed: no concerns identified ?Pain assessment updated: reports occasional intermittent (L) knee pain due to OA, well controlled with OTC ibuprofen: denies pain today ?Falls assessment updated: denies new/ recent falls x 12 months- currently not using assistive devices;  positive reinforcement provided with encouragement to continue efforts at fall  prevention;  ?Medications discussed:  reports independently self-manage and denies current concerns/ issues/ questions around  medications; endorses adherence to taking all medications as prescribed ?Discussed need for PAP for Ozempic: she is currently in process of applying for PAP; waiting to hear back/ forms have been mailed ?Patient tells me she has reached out to her insurance provider and was informed that they will cover Ozempic, as long as it is designated for use for DMII: patient requestes new Rx from PCP to be called in to her outpatient pharmacy; verified outpatient pharmacy-- will make this request with Dr. Quay Burow ?Confirmed patient currently using samples for Ozempic which were provided to her at time of last PCP office visit 06/30/21 ?Reviewed recent PCP office visit 06/30/21: patient verbalizes good understanding of post-office visit instructions and denies questions ?Reviewed upcoming scheduled provider appointments: 07/28/21- outpatient surgery for lipoma removal; 10/30/21- PCP; patient confirms is aware of all and has plans to attend as scheduled ?Discussed plans with patient for ongoing care management follow up and provided patient with direct contact information for care management team    ? ?Diabetes:  (Status: 07/13/21: New goal.) Long Term Goal  ? ?Lab Results  ?Component Value Date  ? HGBA1C 8.0 (H) 06/30/2021  ?  ?Assessed patient's understanding of A1c goal: <7% ?Provided education to patient about basic DM disease process; ?Reviewed patient's current routine for monitoring blood sugars at home- currently checks QD fasting blood sugars; reports general ranges consistently between 170-250; discussed general goals for fasting blood sugars-- between 120-130 ?Provided education around value of alternating daily blood sugar monitoring several times per week, to include 2-hour post-prandial check-- she will start doing this; discussed/ provided education around general post-prandial goals-- less than 180 ?Confirmed with patient no low blood sugar values at home ?Discussed simple dietary strategies she could consider as she  begins making additional changes; she has already cut out soda's and reads nutritional labels; encouraged her to make small, simple, gradual changes over time for best results: will mail printed educational material ?Confirmed patient attends annual eye exams- last eye exam reported as July 2022; encouraged patient to ensure that she schedules 2023 annual eye exam accordingly ? ?Patient Goals/Self-Care Activities: ?As evidenced by review of EHR, collaboration with care team, and patient reporting during CCM RN CM outreach,  ?Patient Sylvia Conner will: ?Take medications as prescribed ?Attend all scheduled provider appointments ?Call pharmacy for medication refills ?Call provider office for new concerns or questions ?Continue to check fasting (first thing in the morning, before eating) blood sugars at home several times each week: your goal for this blood sugar value is between 120-130 ?Begin to periodically alternate checking your blood sugars 2 hours after eating a regular size meal, several times each week: your goal for this blood sugar value is: less than 180 ?Make effort to follow heart healthy, low salt, low cholesterol, carbohydrate-modified, low sugar diet: be thinking of strategies you can try at home, as we discussed today, to decrease the amount of carbohydrates and sugar you eat ?Review enclosed educational material  ?Keep up the great work preventing falls ?  ?  ? ?Sylvia Conner was given information about Care Management services 07/09/21 by the embedded care coordination team including:  ?Care Management services include personalized support from designated clinical staff supervised by her physician, including individualized plan of care and coordination with other care providers ?24/7 contact phone numbers for assistance for urgent and routine care needs. ?The patient may stop CCM services at any time (effective  at the end of the month) by phone call to the office staff. ? ?Patient agreed to services and verbal  consent obtained.  ? ?The patient verbalized understanding of instructions, educational materials, and care plan provided today and agreed to receive a mailed copy of patient instructions, educational materials, an

## 2021-07-15 ENCOUNTER — Other Ambulatory Visit: Payer: Self-pay | Admitting: Internal Medicine

## 2021-07-15 MED ORDER — OZEMPIC (1 MG/DOSE) 2 MG/1.5ML ~~LOC~~ SOPN
1.0000 mg | PEN_INJECTOR | SUBCUTANEOUS | 3 refills | Status: DC
Start: 2021-07-15 — End: 2021-12-30

## 2021-07-17 ENCOUNTER — Ambulatory Visit: Payer: 59 | Admitting: Internal Medicine

## 2021-07-21 ENCOUNTER — Other Ambulatory Visit: Payer: Self-pay

## 2021-07-21 ENCOUNTER — Encounter (HOSPITAL_BASED_OUTPATIENT_CLINIC_OR_DEPARTMENT_OTHER): Payer: Self-pay | Admitting: Surgery

## 2021-07-22 MED ORDER — CHLORHEXIDINE GLUCONATE CLOTH 2 % EX PADS: 6.0000 | MEDICATED_PAD | Freq: Once | CUTANEOUS | Status: DC

## 2021-07-22 NOTE — Progress Notes (Signed)
? ? ? ? ?  Enhanced Recovery after Surgery for Orthopedics ?Enhanced Recovery after Surgery is a protocol used to improve the stress on your body and your recovery after surgery. ? ?Patient Instructions ? ?The night before surgery:  ?No food after midnight. ONLY clear liquids after midnight ? ?The day of surgery (if you do NOT have diabetes):  ?Drink ONE (1) Pre-Surgery Clear Ensure as directed.   ?This drink was given to you during your hospital  ?pre-op appointment visit. ?The pre-op nurse will instruct you on the time to drink the  ?Pre-Surgery Ensure depending on your surgery time. ?Finish the drink at the designated time by the pre-op nurse.  ?Nothing else to drink after completing the  ?Pre-Surgery Clear Ensure. ? ?The day of surgery (if you have diabetes): ?Drink ONE (1) Gatorade 2 (G2) as directed. ?This drink was given to you during your hospital  ?pre-op appointment visit.  ?The pre-op nurse will instruct you on the time to drink the  ? Gatorade 2 (G2) depending on your surgery time. ?Color of the Gatorade may vary. Red is not allowed. ?Nothing else to drink after completing the  ?Gatorade 2 (G2). ? ?       If you have questions, please contact your surgeon?s office. ? ?Patient received surgical soap with instructions, patient verbalized understanding. ?

## 2021-07-28 ENCOUNTER — Ambulatory Visit (HOSPITAL_BASED_OUTPATIENT_CLINIC_OR_DEPARTMENT_OTHER)
Admission: RE | Admit: 2021-07-28 | Discharge: 2021-07-28 | Disposition: A | Discharge status: Home / Self Care | Payer: 59 | Source: Ambulatory Visit | Attending: Surgery | Admitting: Surgery

## 2021-07-28 ENCOUNTER — Encounter (HOSPITAL_BASED_OUTPATIENT_CLINIC_OR_DEPARTMENT_OTHER): Payer: Self-pay | Admitting: Surgery

## 2021-07-28 ENCOUNTER — Encounter (HOSPITAL_BASED_OUTPATIENT_CLINIC_OR_DEPARTMENT_OTHER)
Admission: RE | Disposition: A | Discharge status: Home / Self Care | Payer: Self-pay | Source: Ambulatory Visit | Attending: Surgery

## 2021-07-28 ENCOUNTER — Ambulatory Visit (HOSPITAL_BASED_OUTPATIENT_CLINIC_OR_DEPARTMENT_OTHER): Payer: 59 | Admitting: Anesthesiology

## 2021-07-28 ENCOUNTER — Other Ambulatory Visit: Payer: Self-pay

## 2021-07-28 DIAGNOSIS — I10 Essential (primary) hypertension: Secondary | ICD-10-CM | POA: Diagnosis not present

## 2021-07-28 DIAGNOSIS — E039 Hypothyroidism, unspecified: Secondary | ICD-10-CM | POA: Insufficient documentation

## 2021-07-28 DIAGNOSIS — D171 Benign lipomatous neoplasm of skin and subcutaneous tissue of trunk: Secondary | ICD-10-CM

## 2021-07-28 DIAGNOSIS — Z79890 Hormone replacement therapy (postmenopausal): Secondary | ICD-10-CM | POA: Diagnosis not present

## 2021-07-28 DIAGNOSIS — D1721 Benign lipomatous neoplasm of skin and subcutaneous tissue of right arm: Secondary | ICD-10-CM | POA: Diagnosis not present

## 2021-07-28 DIAGNOSIS — Z79899 Other long term (current) drug therapy: Secondary | ICD-10-CM | POA: Diagnosis not present

## 2021-07-28 DIAGNOSIS — E119 Type 2 diabetes mellitus without complications: Secondary | ICD-10-CM | POA: Insufficient documentation

## 2021-07-28 DIAGNOSIS — Z7984 Long term (current) use of oral hypoglycemic drugs: Secondary | ICD-10-CM | POA: Insufficient documentation

## 2021-07-28 DIAGNOSIS — L723 Sebaceous cyst: Secondary | ICD-10-CM | POA: Insufficient documentation

## 2021-07-28 HISTORY — PX: LIPOMA EXCISION: SHX5283

## 2021-07-28 HISTORY — PX: CYST EXCISION: SHX5701

## 2021-07-28 LAB — GLUCOSE, CAPILLARY
Glucose-Capillary: 268 mg/dL — ABNORMAL HIGH (ref 70–99)
Glucose-Capillary: 208 mg/dL — ABNORMAL HIGH (ref 70–99)

## 2021-07-28 SURGERY — EXCISION LIPOMA
Anesthesia: General | Site: Axilla | Laterality: Right

## 2021-07-28 MED ORDER — FENTANYL CITRATE (PF) 100 MCG/2ML IJ SOLN: 25.0000 ug | INTRAMUSCULAR | Status: DC | PRN

## 2021-07-28 MED ORDER — TRAMADOL HCL 50 MG PO TABS: 50.0000 mg | ORAL_TABLET | Freq: Four times a day (QID) | ORAL | 0 refills | Status: DC | PRN

## 2021-07-28 MED ORDER — FENTANYL CITRATE (PF) 100 MCG/2ML IJ SOLN
INTRAMUSCULAR | Status: AC
  Filled 2021-07-28: qty 2

## 2021-07-28 MED ORDER — PHENYLEPHRINE HCL (PRESSORS) 10 MG/ML IV SOLN
INTRAVENOUS | Status: DC | PRN
  Administered 2021-07-28: 80 ug via INTRAVENOUS

## 2021-07-28 MED ORDER — VANCOMYCIN HCL IN DEXTROSE 1-5 GM/200ML-% IV SOLN
1000.0000 mg | INTRAVENOUS | Status: AC
  Administered 2021-07-28: 1000 mg via INTRAVENOUS

## 2021-07-28 MED ORDER — MIDAZOLAM HCL 5 MG/5ML IJ SOLN
INTRAMUSCULAR | Status: DC | PRN
  Administered 2021-07-28: 2 mg via INTRAVENOUS

## 2021-07-28 MED ORDER — LIDOCAINE 2% (20 MG/ML) 5 ML SYRINGE
INTRAMUSCULAR | Status: AC
  Filled 2021-07-28: qty 5

## 2021-07-28 MED ORDER — FENTANYL CITRATE (PF) 100 MCG/2ML IJ SOLN
INTRAMUSCULAR | Status: DC | PRN
  Administered 2021-07-28 (×3): 50 ug via INTRAVENOUS

## 2021-07-28 MED ORDER — MIDAZOLAM HCL 2 MG/2ML IJ SOLN
INTRAMUSCULAR | Status: AC
  Filled 2021-07-28: qty 2

## 2021-07-28 MED ORDER — ONDANSETRON HCL 4 MG/2ML IJ SOLN
INTRAMUSCULAR | Status: AC
  Filled 2021-07-28: qty 2

## 2021-07-28 MED ORDER — BUPIVACAINE-EPINEPHRINE (PF) 0.25% -1:200000 IJ SOLN
INTRAMUSCULAR | Status: AC
  Filled 2021-07-28: qty 180

## 2021-07-28 MED ORDER — LIDOCAINE 2% (20 MG/ML) 5 ML SYRINGE
INTRAMUSCULAR | Status: DC | PRN
  Administered 2021-07-28: 60 mg via INTRAVENOUS

## 2021-07-28 MED ORDER — BUPIVACAINE-EPINEPHRINE 0.25% -1:200000 IJ SOLN
INTRAMUSCULAR | Status: DC | PRN
  Administered 2021-07-28: 12 mL

## 2021-07-28 MED ORDER — VANCOMYCIN HCL IN DEXTROSE 1-5 GM/200ML-% IV SOLN
INTRAVENOUS | Status: AC
  Filled 2021-07-28: qty 200

## 2021-07-28 MED ORDER — ACETAMINOPHEN 500 MG PO TABS
ORAL_TABLET | ORAL | Status: AC
  Filled 2021-07-28: qty 2

## 2021-07-28 MED ORDER — PROPOFOL 500 MG/50ML IV EMUL
INTRAVENOUS | Status: AC
  Filled 2021-07-28: qty 50

## 2021-07-28 MED ORDER — ONDANSETRON HCL 4 MG/2ML IJ SOLN
INTRAMUSCULAR | Status: DC | PRN
  Administered 2021-07-28: 4 mg via INTRAVENOUS

## 2021-07-28 MED ORDER — DEXAMETHASONE SODIUM PHOSPHATE 10 MG/ML IJ SOLN
INTRAMUSCULAR | Status: AC
  Filled 2021-07-28: qty 1

## 2021-07-28 MED ORDER — PROPOFOL 10 MG/ML IV BOLUS
INTRAVENOUS | Status: DC | PRN
  Administered 2021-07-28: 30 mg via INTRAVENOUS
  Administered 2021-07-28: 150 mg via INTRAVENOUS

## 2021-07-28 MED ORDER — DEXAMETHASONE SODIUM PHOSPHATE 4 MG/ML IJ SOLN
INTRAMUSCULAR | Status: DC | PRN
Start: 2021-07-28 — End: 2021-07-28
  Administered 2021-07-28: 4 mg via INTRAVENOUS

## 2021-07-28 MED ORDER — LACTATED RINGERS IV SOLN: INTRAVENOUS | Status: DC

## 2021-07-28 MED ORDER — ACETAMINOPHEN 500 MG PO TABS
1000.0000 mg | ORAL_TABLET | ORAL | Status: AC
  Administered 2021-07-28: 1000 mg via ORAL

## 2021-07-28 SURGICAL SUPPLY — 53 items
APL PRP STRL LF DISP 70% ISPRP (MISCELLANEOUS) ×2
APL SKNCLS STERI-STRIP NONHPOA (GAUZE/BANDAGES/DRESSINGS) ×2
BENZOIN TINCTURE PRP APPL 2/3 (GAUZE/BANDAGES/DRESSINGS) ×3 IMPLANT
BLADE CLIPPER SURG (BLADE) ×1 IMPLANT
BLADE SURG 15 STRL LF DISP TIS (BLADE) ×2 IMPLANT
BLADE SURG 15 STRL SS (BLADE) ×3
BNDG CMPR 5X3 CHSV STRCH STRL (GAUZE/BANDAGES/DRESSINGS) ×2
BNDG COHESIVE 3X5 TAN ST LF (GAUZE/BANDAGES/DRESSINGS) ×1 IMPLANT
CANISTER SUCT 1200ML W/VALVE (MISCELLANEOUS) ×1 IMPLANT
CHLORAPREP W/TINT 26 (MISCELLANEOUS) ×3 IMPLANT
COVER BACK TABLE 60X90IN (DRAPES) ×3 IMPLANT
COVER MAYO STAND STRL (DRAPES) ×3 IMPLANT
DRAPE LAPAROTOMY 100X72 PEDS (DRAPES) ×3 IMPLANT
DRAPE UTILITY XL STRL (DRAPES) ×3 IMPLANT
DRSG TEGADERM 2-3/8X2-3/4 SM (GAUZE/BANDAGES/DRESSINGS) ×3 IMPLANT
DRSG TEGADERM 4X4.75 (GAUZE/BANDAGES/DRESSINGS) ×3 IMPLANT
ELECT COATED BLADE 2.86 ST (ELECTRODE) ×3 IMPLANT
ELECT REM PT RETURN 9FT ADLT (ELECTROSURGICAL) ×3
ELECTRODE REM PT RTRN 9FT ADLT (ELECTROSURGICAL) ×2 IMPLANT
GAUZE SPONGE 4X4 12PLY STRL LF (GAUZE/BANDAGES/DRESSINGS) ×1 IMPLANT
GLOVE BIO SURGEON STRL SZ 6.5 (GLOVE) ×1 IMPLANT
GLOVE BIO SURGEON STRL SZ7 (GLOVE) ×4 IMPLANT
GLOVE BIOGEL PI IND STRL 6.5 (GLOVE) IMPLANT
GLOVE BIOGEL PI IND STRL 7.0 (GLOVE) IMPLANT
GLOVE BIOGEL PI IND STRL 7.5 (GLOVE) ×2 IMPLANT
GLOVE BIOGEL PI INDICATOR 6.5 (GLOVE) ×1
GLOVE BIOGEL PI INDICATOR 7.0 (GLOVE) ×1
GLOVE BIOGEL PI INDICATOR 7.5 (GLOVE) ×1
GOWN STRL REUS W/ TWL LRG LVL3 (GOWN DISPOSABLE) ×4 IMPLANT
GOWN STRL REUS W/TWL LRG LVL3 (GOWN DISPOSABLE) ×6
NDL HYPO 25X1 1.5 SAFETY (NEEDLE) ×2 IMPLANT
NEEDLE HYPO 25X1 1.5 SAFETY (NEEDLE) ×3 IMPLANT
NS IRRIG 1000ML POUR BTL (IV SOLUTION) ×1 IMPLANT
PACK BASIN DAY SURGERY FS (CUSTOM PROCEDURE TRAY) ×3 IMPLANT
PENCIL SMOKE EVACUATOR (MISCELLANEOUS) ×3 IMPLANT
SHEET MEDIUM DRAPE 40X70 STRL (DRAPES) ×1 IMPLANT
SLEEVE SCD COMPRESS KNEE MED (STOCKING) ×1 IMPLANT
SPIKE FLUID TRANSFER (MISCELLANEOUS) ×3 IMPLANT
SPONGE GAUZE 2X2 8PLY STRL LF (GAUZE/BANDAGES/DRESSINGS) ×3 IMPLANT
SPONGE T-LAP 4X18 ~~LOC~~+RFID (SPONGE) ×3 IMPLANT
STOCKINETTE IMPERVIOUS LG (DRAPES) ×1 IMPLANT
STRIP CLOSURE SKIN 1/2X4 (GAUZE/BANDAGES/DRESSINGS) ×3 IMPLANT
SUT MON AB 4-0 PC3 18 (SUTURE) ×1 IMPLANT
SUT PROLENE 6 0 P 1 18 (SUTURE) IMPLANT
SUT SILK 2 0 PERMA HAND 18 BK (SUTURE) IMPLANT
SUT VIC AB 3-0 SH 27 (SUTURE)
SUT VIC AB 3-0 SH 27X BRD (SUTURE) IMPLANT
SUT VICRYL 3-0 CR8 SH (SUTURE) ×1 IMPLANT
SYR BULB EAR ULCER 3OZ GRN STR (SYRINGE) ×3 IMPLANT
SYR CONTROL 10ML LL (SYRINGE) ×3 IMPLANT
TOWEL GREEN STERILE FF (TOWEL DISPOSABLE) ×3 IMPLANT
TUBE CONNECTING 20X1/4 (TUBING) ×1 IMPLANT
YANKAUER SUCT BULB TIP NO VENT (SUCTIONS) ×1 IMPLANT

## 2021-07-28 NOTE — H&P (Signed)
Subjective  ?  ?Chief Complaint: Lipoma ?  ?  ?  ?History of Present Illness: ?Sylvia Conner is a 66 y.o. female who is seen today as an office consultation at the request of Dr. Pasty Arch for evaluation of Lipoma ?.   ?  ?This is a 66 year old female who was initially evaluated in 2019 and 2022..  She presented with more than 10-year history of enlarging masses in her right upper lateral arm as well as her right lateral chest.  These have caused increasing discomfort.  They have enlarged in size.  The mass in the right arm causes some pain radiating down towards her hand.  She has also developed a small superficial lump in her right axilla. ?  ?We had previously recommended surgery to excise both of these areas.  She has had financial reasons both times that kept her from scheduling surgery.  She presents now to have all 3 of these areas removed. ?  ?Review of Systems: ?A complete review of systems was obtained from the patient.  I have reviewed this information and discussed as appropriate with the patient.  See HPI as well for other ROS. ?  ?Review of Systems  ?Constitutional: Negative.   ?HENT: Negative.   ?Eyes: Negative.   ?Respiratory: Negative.   ?Cardiovascular: Negative.   ?Gastrointestinal: Negative.   ?Genitourinary: Negative.   ?Musculoskeletal: Negative.   ?Skin: Negative.   ?Neurological: Negative.   ?Endo/Heme/Allergies: Negative.   ?Psychiatric/Behavioral: Negative.   ?  ?  ?  ?Medical History: ?Past Medical History  ?       ?Past Medical History:  ?Diagnosis Date  ? Diabetes mellitus without complication (CMS-HCC)    ? Hypertension    ?  ?  ?  ?     ?Patient Active Problem List  ?Diagnosis  ? Lipoma of right upper extremity  ? Lipoma of lateral chest wall  ? Sebaceous cyst of right axilla  ?  ?  ?Past Surgical History  ?History reviewed. No pertinent surgical history.  ?  ?  ?Allergies  ?         ?Allergies  ?Allergen Reactions  ? Penicillins Anaphylaxis and Rash  ?    Has patient had a PCN reaction  causing immediate rash, facial/tongue/throat swelling, SOB or lightheadedness with hypotension: Yes ?Has patient had a PCN reaction causing severe rash involving mucus membranes or skin necrosis: No ?Has patient had a PCN reaction that required hospitalization Yes ?Has patient had a PCN reaction occurring within the last 10 years: No ?If all of the above answers are "NO", then may proceed with Cephalosporin use. ?Has patient had a PCN reaction causing immediate rash, facial/tongue/throat swelling, SOB or lightheadedness with hypotension: Yes ?Has patient had a PCN reaction causing severe rash involving mucus membranes or skin necrosis: No ?Has patient had a PCN reaction that required hospitalization Yes ?Has patient had a PCN reaction occurring within the last 10 years: No ?If all of the above answers are "NO", then may proceed with Cephalosporin use. ?   ? Glipizide Unknown  ?    Breakout on arms  ? Sitagliptin Nausea  ? Sulfa (Sulfonamide Antibiotics) Swelling  ? Shellfish Containing Products Hives  ?  ?  ?  ?           ?Current Outpatient Medications on File Prior to Visit  ?Medication Sig Dispense Refill  ? triamterene-hydrochlorothiazide (MAXZIDE) 75-50 mg tablet Take 1 tablet by mouth every morning      ? levothyroxine (  SYNTHROID) 137 MCG tablet TAKE 1 TABLET BY MOUTH ONCE DAILY BEFORE BREAKFAST      ? metFORMIN (GLUCOPHAGE) 1000 MG tablet TAKE 1 TABLET BY MOUTH TWICE DAILY WITH A MEAL      ?  ?No current facility-administered medications on file prior to visit.  ?  ?  ?Family History  ?         ?Family History  ?Problem Relation Age of Onset  ? High blood pressure (Hypertension) Mother    ? Diabetes Mother    ? Diabetes Sister    ? Diabetes Paternal Grandmother    ?  ?  ?  ?Social History  ?  ?     ?Tobacco Use  ?Smoking Status Never  ?Smokeless Tobacco Never  ?  ?  ?Social History  ?Social History  ?  ?       ?Socioeconomic History  ? Marital status: Married  ?Tobacco Use  ? Smoking status: Never  ?  Smokeless tobacco: Never  ?Substance and Sexual Activity  ? Alcohol use: Never  ? Drug use: Never  ?  ?  ?  ?Objective:  ?  ?  ?     ?Vitals:  ?    ?BP: (!) 150/84  ?Pulse: 105  ?Temp: 36.7 ?C (98 ?F)  ?SpO2: 97%  ?Weight: 70.5 kg (155 lb 6.4 oz)  ?Height: 158.8 cm (5' 2.5")  ?  ?Body mass index is 27.97 kg/m?. ?  ?Physical Exam  ?  ?Constitutional:  WDWN in NAD, conversant, no obvious deformities; lying in bed comfortably ?Eyes:  Pupils equal, round; sclera anicteric; moist conjunctiva; no lid lag ?HENT:  Oral mucosa moist; good dentition  ?Neck:  No masses palpated, trachea midline; no thyromegaly ?Lungs:  CTA bilaterally; normal respiratory effort ?Right lateral chest -oval-shaped 3 cm subcutaneous mass, smooth, well demarcated, mildly tender ?Right lateral upper arm shows a protruding 5 cm subcutaneous mass that is smooth, well demarcated, mildly tender ?Right axilla shows a 1 cm sebaceous cyst with no signs of active infection. ?CV:  Regular rate and rhythm; no murmurs; extremities well-perfused with no edema ?Abd:  +bowel sounds, soft, non-tender, no palpable organomegaly; no palpable hernias ?Musc:  Unable to assess gait; no apparent clubbing or cyanosis in extremities ?Lymphatic:  No palpable cervical or axillary lymphadenopathy ?Skin:  Warm, dry; no sign of jaundice ?Psychiatric - alert and oriented x 4; calm mood and affect ?  ?  ?  ?Assessment and Plan:  ?Diagnoses and all orders for this visit: ?  ?Lipoma of right upper extremity ?  ?Lipoma of lateral chest wall ?  ?Sebaceous cyst of right axilla ?  ?  ?  ?Excision of subcutaneous lipoma of the right upper arm, right lateral chest, and sebaceous cyst of the right axilla. ?  ?  ?The surgical procedure has been discussed with the patient.  Potential risks, benefits, alternative treatments, and expected outcomes have been explained.  All of the patient's questions at this time have been answered.  The likelihood of reaching the patient's treatment goal is  good.  The patient understand the proposed surgical procedure and wishes to proceed. ?  ? ?Imogene Burn. Apolo Cutshaw, MD, FACS ?[facility]  ?General Surgery ? ? ?07/28/2021 ?7:26 AM ? ? ?

## 2021-07-28 NOTE — Transfer of Care (Signed)
Immediate Anesthesia Transfer of Care Note ? ?Patient: Melania Delagarza ? ?Procedure(s) Performed: EXCISION OF SUBCUTANEOUS LIPOMA RIGHT UPPER ARM, LIPOMA RIGHT CHEST WALL (Right) ?EXCISION SEBACEOUS CYST RIGHT AXILLA (Right: Axilla) ? ?Patient Location: PACU ? ?Anesthesia Type:General ? ?Level of Consciousness: sedated ? ?Airway & Oxygen Therapy: Patient Spontanous Breathing and Patient connected to face mask oxygen ? ?Post-op Assessment: Report given to RN and Post -op Vital signs reviewed and stable ? ?Post vital signs: Reviewed and stable ? ?Last Vitals:  ?Vitals Value Taken Time  ?BP 114/73 07/28/21 0901  ?Temp    ?Pulse 83 07/28/21 0904  ?Resp 12 07/28/21 0904  ?SpO2 96 % 07/28/21 0904  ?Vitals shown include unvalidated device data. ? ?Last Pain:  ?Vitals:  ? 07/28/21 0623  ?TempSrc: Oral  ?PainSc: 0-No pain  ?   ? ?Patients Stated Pain Goal: 7 (07/28/21 5615) ? ?Complications: No notable events documented. ?

## 2021-07-28 NOTE — Op Note (Signed)
Preop diagnosis: #1 subcutaneous lipoma right upper arm 3.5 cm ?2.  Subcutaneous lipoma right chest 3.0 cm ?3.  Sebaceous cyst right axilla 1 cm ?Postop diagnosis: Same ?Procedure performed: Excision of subcutaneous lipoma right upper arm, subcutaneous lipoma right chest, sebaceous cyst right axilla ?Surgeon:Tex Conroy K Mattix Imhof ?Anesthesia: General ?Indications:This is a 66 year old female who was initially evaluated in 2019 and 2022..  She presented with more than 10-year history of enlarging masses in her right upper lateral arm as well as her right lateral chest.  These have caused increasing discomfort.  They have enlarged in size.  The mass in the right arm causes some pain radiating down towards her hand.  She has also developed a small superficial lump in her right axilla. ?  ?We had previously recommended surgery to excise both of these areas.  She has had financial reasons both times that kept her from scheduling surgery.  She presents now to have all 3 of these areas removed. ? ?Description of procedure: The patient is brought to the operating room placed in supine position on the operating table.  After an adequate level of general anesthesia was obtained, her right chest, axilla, and upper arm were prepped with ChloraPrep and draped in sterile fashion.  An impervious stockinette was used to cover the forearm. ? ?We began with the lipoma on the lateral part of the upper arm.  We anesthetized this area with local anesthetic.  I made a vertical incision over this area.  We dissected down through the dermis to the surface of the lipoma.  We excised the entire lipoma off of the underlying muscle.  This was sent for pathologic examination.  Hemostasis was obtained with cautery.  We closed the wound with 3-0 Vicryl 4-0 Monocryl. ? ?We then extended the patient's shoulder to expose the chest wall and the axilla.  We made a transverse incision across the lipoma on the chest wall after instilling local anesthetic.  We  dissected down to the surface lipoma.  I dissected the lipoma off of the underlying chest wall.  This was also sent for pathologic examination.  Hemostasis was obtained with cautery.  We closed with 3-0 Vicryl 4-0 Monocryl. ? ?She has a 1 cm sebaceous cyst in the axilla.  I made a small incision over the cyst.  We dissected the cyst out completely.  There is no sign of infection.  We closed this wound with 4-0 Monocryl.  Benzoin and Steri-Strips were applied to all of the incisions.  Dressings were placed.  She was extubated and brought to the recovery room in stable condition.  All sponge, instrument, and needle counts are correct. ? ?Imogene Burn. Stephanny Tsutsui, MD, FACS ?[facility]  ?General Surgery ? ? ?07/28/2021 ?8:59 AM ? ?

## 2021-07-28 NOTE — Anesthesia Postprocedure Evaluation (Signed)
Anesthesia Post Note ? ?Patient: Sylvia Conner ? ?Procedure(s) Performed: EXCISION OF SUBCUTANEOUS LIPOMA RIGHT UPPER ARM, LIPOMA RIGHT CHEST WALL (Right) ?EXCISION SEBACEOUS CYST RIGHT AXILLA (Right: Axilla) ? ?  ? ?Patient location during evaluation: PACU ?Anesthesia Type: General ?Level of consciousness: awake and alert ?Pain management: pain level controlled ?Vital Signs Assessment: post-procedure vital signs reviewed and stable ?Respiratory status: spontaneous breathing, nonlabored ventilation, respiratory function stable and patient connected to nasal cannula oxygen ?Cardiovascular status: blood pressure returned to baseline and stable ?Postop Assessment: no apparent nausea or vomiting ?Anesthetic complications: no ? ? ?No notable events documented. ? ?Last Vitals:  ?Vitals:  ? 07/28/21 0930 07/28/21 0955  ?BP: 134/87 (!) 141/76  ?Pulse: 71 72  ?Resp: 13 16  ?Temp:  (!) 36.4 ?C  ?SpO2: 100% 96%  ?  ?Last Pain:  ?Vitals:  ? 07/28/21 0955  ?TempSrc:   ?PainSc: 0-No pain  ? ? ?  ?  ?  ?  ?  ?  ? ?Sylvia Conner ? ? ? ? ?

## 2021-07-28 NOTE — Discharge Instructions (Addendum)
[facility] ?Office Phone Number [phone] ? ?Lipoma Excision: POST OP INSTRUCTIONS ? ?Always review your discharge instruction sheet given to you by the facility where your surgery was performed. ? ?IF YOU HAVE DISABILITY OR FAMILY LEAVE FORMS, YOU MUST BRING THEM TO THE OFFICE FOR PROCESSING.  DO NOT GIVE THEM TO YOUR DOCTOR. ? ?A prescription for pain medication may be given to you upon discharge.  Take your pain medication as prescribed, if needed.  If narcotic pain medicine is not needed, then you may take acetaminophen (Tylenol) or ibuprofen (Advil) as needed. ?Take your usually prescribed medications unless otherwise directed ?If you need a refill on your pain medication, please contact your pharmacy.  They will contact our office to request authorization.  Prescriptions will not be filled after 5pm or on week-ends. ?You should eat very light the first 24 hours after surgery, such as soup, crackers, pudding, etc.  Resume your normal diet the day after surgery. ?Most patients will experience some swelling and bruising around the surgical site.  Ice packs will help.  Swelling and bruising can take several days to resolve.  ?It is common to experience some constipation if taking pain medication after surgery.  Increasing fluid intake and taking a stool softener will usually help or prevent this problem from occurring.  A mild laxative (Milk of Magnesia or Miralax) should be taken according to package directions if there are no bowel movements after 48 hours. ?You may remove your bandages 48 hours after surgery, and you may shower at that time.  You will have steri-strips (small skin tapes) in place directly over the incision.  These strips should be left on the skin for 7-10 days.   ?ACTIVITIES:  You may resume regular daily activities (gradually increasing) beginning the next day.   You may have sexual intercourse when it is comfortable. ?You may drive when you no longer are taking  prescription pain medication, you can comfortably wear a seatbelt, and you can safely maneuver your car and apply brakes. ?RETURN TO WORK:  1-2 weeks ?You should see your doctor in the office for a follow-up appointment approximately two to three weeks after your surgery.   ? ?WHEN TO CALL YOUR DOCTOR: ?Fever over 101.0 ?Nausea and/or vomiting. ?Extreme swelling or bruising. ?Continued bleeding from incision. ?Increased pain, redness, or drainage from the incision. ? ?The clinic staff is available to answer your questions during regular business hours.  Please don?t hesitate to call and ask to speak to one of the nurses for clinical concerns.  If you have a medical emergency, go to the nearest emergency room or call 911.  A surgeon from [facility] is always on call at the hospital. ? ?For further questions, please visit [email or web address]  ? ? ?May have Tylenol ay 12:30pm today if needed ? ?Post Anesthesia Home Care Instructions ? ?Activity: ?Get plenty of rest for the remainder of the day. A responsible individual must stay with you for 24 hours following the procedure.  ?For the next 24 hours, DO NOT: ?-Drive a car ?-Paediatric nurse ?-Drink alcoholic beverages ?-Take any medication unless instructed by your physician ?-Make any legal decisions or sign important papers. ? ?Meals: ?Start with liquid foods such as gelatin or soup. Progress to regular foods as tolerated. Avoid greasy, spicy, heavy foods. If nausea and/or vomiting occur, drink only clear liquids until the nausea and/or vomiting subsides. Call your physician if vomiting continues. ? ?Special Instructions/Symptoms: ?Your throat may feel dry or sore from  the anesthesia or the breathing tube placed in your throat during surgery. If this causes discomfort, gargle with warm salt water. The discomfort should disappear within 24 hours. ? ?If you had a scopolamine patch placed behind your ear for the management of post- operative nausea  and/or vomiting: ? ?1. The medication in the patch is effective for 72 hours, after which it should be removed.  Wrap patch in a tissue and discard in the trash. Wash hands thoroughly with soap and water. ?2. You may remove the patch earlier than 72 hours if you experience unpleasant side effects which may include dry mouth, dizziness or visual disturbances. ?3. Avoid touching the patch. Wash your hands with soap and water after contact with the patch. ?    ?

## 2021-07-28 NOTE — Anesthesia Preprocedure Evaluation (Addendum)
Anesthesia Evaluation  ?Patient identified by MRN, date of birth, ID band ?Patient awake ? ? ? ?Reviewed: ?Allergy & Precautions, NPO status , Patient's Chart, lab work & pertinent test results ? ?Airway ?Mallampati: II ? ?TM Distance: >3 FB ?Neck ROM: Full ? ? ? Dental ? ?(+) Partial Upper ?  ?Pulmonary ?neg pulmonary ROS,  ?  ?Pulmonary exam normal ? ? ? ? ? ? ? Cardiovascular ?hypertension,  ?Rhythm:Regular Rate:Normal ? ? ?  ?Neuro/Psych ?negative neurological ROS ? negative psych ROS  ? GI/Hepatic ?negative GI ROS, Neg liver ROS,   ?Endo/Other  ?diabetes, Type 2, Oral Hypoglycemic AgentsHypothyroidism  ? Renal/GU ?negative Renal ROS  ?negative genitourinary ?  ?Musculoskeletal ? ?(+) Arthritis , Osteoarthritis,  Right arm lipoma  ? Abdominal ?Normal abdominal exam  (+)   ?Peds ? Hematology ? ?(+) Blood dyscrasia, anemia ,   ?Anesthesia Other Findings ? ? Reproductive/Obstetrics ? ?  ? ? ? ? ? ? ? ? ? ? ? ? ? ?  ?  ? ? ? ? ? ? ? ?Anesthesia Physical ?Anesthesia Plan ? ?ASA: 2 ? ?Anesthesia Plan: General  ? ?Post-op Pain Management: Tylenol PO (pre-op)*  ? ?Induction: Intravenous ? ?PONV Risk Score and Plan: 3 and Ondansetron, Dexamethasone, Midazolam and Treatment may vary due to age or medical condition ? ?Airway Management Planned: Mask and LMA ? ?Additional Equipment: None ? ?Intra-op Plan:  ? ?Post-operative Plan: Extubation in OR ? ?Informed Consent: I have reviewed the patients History and Physical, chart, labs and discussed the procedure including the risks, benefits and alternatives for the proposed anesthesia with the patient or authorized representative who has indicated his/her understanding and acceptance.  ? ? ? ?Dental advisory given ? ?Plan Discussed with: CRNA ? ?Anesthesia Plan Comments: (Lab Results ?     Component                Value               Date                 ?     WBC                      4.7                 06/30/2021           ?     HGB                       12.4                06/30/2021           ?     HCT                      38.4                06/30/2021           ?     MCV                      74.7 (L)            06/30/2021           ?     PLT  309.0               06/30/2021           ?Lab Results ?     Component                Value               Date                 ?     NA                       135                 06/30/2021           ?     K                        3.4 (L)             06/30/2021           ?     CO2                      28                  06/30/2021           ?     GLUCOSE                  167 (H)             06/30/2021           ?     BUN                      17                  06/30/2021           ?     CREATININE               0.75                06/30/2021           ?     CALCIUM                  9.7                 06/30/2021           ?     GFRNONAA                 >60                 10/28/2020          )  ? ? ? ? ? ? ?Anesthesia Quick Evaluation ? ?

## 2021-07-28 NOTE — Anesthesia Procedure Notes (Signed)
Procedure Name: LMA Insertion ?Date/Time: 07/28/2021 7:52 AM ?Performed by: Maryella Shivers, CRNA ?Pre-anesthesia Checklist: Patient identified, Emergency Drugs available, Suction available and Patient being monitored ?Patient Re-evaluated:Patient Re-evaluated prior to induction ?Oxygen Delivery Method: Circle system utilized ?Preoxygenation: Pre-oxygenation with 100% oxygen ?Induction Type: IV induction ?Ventilation: Mask ventilation without difficulty ?LMA: LMA inserted ?LMA Size: 4.0 ?Number of attempts: 1 ?Airway Equipment and Method: Bite block ?Placement Confirmation: positive ETCO2 ?Tube secured with: Tape ?Dental Injury: Teeth and Oropharynx as per pre-operative assessment  ? ? ? ? ?

## 2021-07-29 ENCOUNTER — Encounter (HOSPITAL_BASED_OUTPATIENT_CLINIC_OR_DEPARTMENT_OTHER): Payer: Self-pay | Admitting: Surgery

## 2021-07-29 LAB — SURGICAL PATHOLOGY

## 2021-07-29 NOTE — Progress Notes (Signed)
Left message stating courtesy call and if any questions or concerns please call the doctors office.  

## 2021-07-29 NOTE — Progress Notes (Deleted)
    Subjective:    Patient ID: Sylvia Conner, female    DOB: [birth year], 65 y.o.   MRN: [id number]  This visit occurred during the SARS-CoV-2 public health emergency.  Safety protocols were in place, including screening questions prior to the visit, additional usage of staff PPE, and extensive cleaning of exam room while observing appropriate contact time as indicated for disinfecting solutions.    HPI Sylvia Conner is here for No chief complaint on file.    Right ear pain -     Medications and allergies reviewed with patient and updated if appropriate.  Current Outpatient Medications on File Prior to Visit  Medication Sig Dispense Refill   blood glucose meter kit and supplies KIT Dispense based on patient and insurance preference. Use up to four times daily as directed. (FOR E11.9). 1 each 0   levothyroxine (SYNTHROID) 137 MCG tablet Take 1 tablet (137 mcg total) by mouth daily before breakfast. 6 days a week.  Take 0.5 tablet PO one day a week 90 tablet 1   meloxicam (MOBIC) 15 MG tablet Take 1 tablet (15 mg total) by mouth daily. (Patient taking differently: Take 15 mg by mouth daily as needed.) 20 tablet 0   metFORMIN (GLUCOPHAGE) 1000 MG tablet TAKE 1 TABLET BY MOUTH TWICE DAILY WITH A MEAL 180 tablet 0   Multiple Vitamins-Minerals (HAIR SKIN AND NAILS FORMULA PO) Take 1 tablet by mouth daily.     pioglitazone (ACTOS) 30 MG tablet Take 1 tablet (30 mg total) by mouth daily. 30 tablet 5   potassium chloride SA (KLOR-CON) 20 MEQ tablet Take 2 tablets (40 mEq total) by mouth daily. 180 tablet 1   rosuvastatin (CRESTOR) 10 MG tablet Take 1 tablet (10 mg total) by mouth daily. 90 tablet 3   Semaglutide, 1 MG/DOSE, (OZEMPIC, 1 MG/DOSE,) 2 MG/1.5ML SOPN Inject 1 mg into the skin once a week. Dx DM 2 9 mL 3   traMADol (ULTRAM) 50 MG tablet Take 1 tablet (50 mg total) by mouth every 6 (six) hours as needed for moderate pain or severe pain. 20 tablet 0   triamcinolone cream (KENALOG) 0.5 % Apply  topically 2 (two) times daily as needed. 30 g 1   triamterene-hydrochlorothiazide (MAXZIDE) 75-50 MG tablet TAKE 1 TABLET BY MOUTH ONCE DAILY IN THE MORNING 90 tablet 0   No current facility-administered medications on file prior to visit.    Review of Systems     Objective:  There were no vitals filed for this visit. BP Readings from Last 3 Encounters:  07/28/21 (!) 141/76  06/30/21 (!) 144/84  04/10/21 140/90   Wt Readings from Last 3 Encounters:  07/28/21 156 lb 4.9 oz (70.9 kg)  06/30/21 153 lb 12.8 oz (69.8 kg)  04/10/21 157 lb 3.2 oz (71.3 kg)   There is no height or weight on file to calculate BMI.    Physical Exam         Assessment & Plan:    See Problem List for Assessment and Plan of chronic medical problems.

## 2021-07-30 ENCOUNTER — Ambulatory Visit: Payer: 59 | Admitting: Internal Medicine

## 2021-08-10 ENCOUNTER — Ambulatory Visit: Payer: 59 | Admitting: Internal Medicine

## 2021-08-12 ENCOUNTER — Ambulatory Visit: Payer: 59 | Admitting: *Deleted

## 2021-08-12 DIAGNOSIS — E1165 Type 2 diabetes mellitus with hyperglycemia: Secondary | ICD-10-CM

## 2021-08-12 DIAGNOSIS — I10 Essential (primary) hypertension: Secondary | ICD-10-CM

## 2021-08-12 NOTE — Patient Instructions (Signed)
Visit Information ? ?Sylvia Conner, thank you for taking time to talk with me today. Please don't hesitate to contact me if I can be of assistance to you before our next scheduled telephone appointment ? ?Below are the goals we discussed today:  ?Patient Self-Care Activities: ?Patient Sylvia Conner will: ?Take medications as prescribed ?Attend all scheduled provider appointments ?Call pharmacy for medication refills ?Call provider office for new concerns or questions ?Continue to check fasting (first thing in the morning, before eating) blood sugars at home several times each week: your goal for this blood sugar value is between 120-130 ?Continue to periodically alternate checking your blood sugars 2 hours after eating a regular size meal, several times each week: your goal for this blood sugar value is: less than 180 ?Continue to make effort to follow heart healthy, low salt, low cholesterol, carbohydrate-modified, low sugar diet: keep thinking of strategies you can try at home, as we discussed today, to decrease the amount of carbohydrates and sugar you eat-- keep up the great work reviewing the educational booklet about Diabetes that I mailed to you-- try to make small changes every week for lasting change to take effect ?Try to check your blood pressure when you are out in the community and have access to a blood pressure monitor-- when you check your blood pressure, try to write it down on paper so you can show to Dr. Quay Burow when you go for your next office visit ?Keep up the great work preventing falls ?Keep up the great work at walking outside for exercise-- regular, structured activity and exercise is a great way to help control both your blood sugar and your blood pressure ? ?Our next scheduled telephone follow up visit/ appointment is scheduled on: Friday, September 11, 2021 at 10:00 am- This is a PHONE CALL appointment ? ?If you need to cancel or re-schedule our visit, please call [phone] and our care guide team will be  happy to assist you. ?  ?I look forward to hearing about your progress. ?  ?Oneta Rack, RN, BSN, CCRN Alumnus ?[place] ?([phone]: direct office ? ?If you are experiencing a Mental Health or [place] or need someone to talk to, please  ?call the Suicide and Crisis Lifeline: 988 ?call the [place] National Suicide Prevention Lifeline: (332)704-6031 or TTY: 505 069 6677 TTY 514-099-3848) to talk to a trained counselor ?call 1-800-273-TALK (toll free, 24 hour hotline) ?go to [facility] [address] [place] [phone]) ?call 911  ? ?Patient verbalizes understanding of instructions and care plan provided today and agrees to view in [place]. Active MyChart status confirmed with patient   ?Telephone follow up appointment with care management team member scheduled for: Friday, September 11, 2021 at 10:00 am ?The patient has been provided with contact information for the care management team and has been advised to call with any health related questions or concerns  ? ?Diabetes Mellitus and Exercise ?Exercising regularly is important for overall health, especially for people who have diabetes mellitus. Exercising is not only about losing weight. It has many other health benefits, such as increasing muscle strength and bone density and reducing body fat and stress. This leads to improved fitness, flexibility, and endurance, all of which result in better overall health. ?What are the benefits of exercise if I have diabetes? ?Exercise has many benefits for people with diabetes. They include: ?Helping to lower and control blood sugar (glucose). ?Helping the body to respond  better to the hormone insulin by improving insulin sensitivity. ?Reducing how much insulin the body needs. ?Lowering the risk for heart disease by: ?Lowering "bad" cholesterol and triglyceride levels. ?Increasing "good" cholesterol levels. ?Lowering  blood pressure. ?Lowering blood glucose levels. ?What is my activity plan? ?Your health care provider or certified diabetes educator can help you make a plan for the type and frequency of exercise that works for you. This is called your activity plan. Be sure to: ?Get at least 150 minutes of medium-intensity or high-intensity exercise each week. Exercises may include brisk walking, biking, or water aerobics. ?Do stretching and strengthening exercises, such as yoga or weight lifting, at least 2 times a week. ?Spread out your activity over at least 3 days of the week. ?Get some form of physical activity each day. ?Do not go more than 2 days in a row without some kind of physical activity. ?Avoid being inactive for more than 90 minutes at a time. Take frequent breaks to walk or stretch. ?Choose exercises or activities that you enjoy. Set realistic goals. ?Start slowly and gradually increase your exercise intensity over time. ?How do I manage my diabetes during exercise? ? ?Monitor your blood glucose ?Check your blood glucose before and after exercising. If your blood glucose is: ?240 mg/dL (13.3 mmol/L) or higher before you exercise, check your urine for ketones. These are chemicals created by the liver. If you have ketones in your urine, do not exercise until your blood glucose returns to normal. ?100 mg/dL (5.6 mmol/L) or lower, eat a snack containing 15-20 grams of carbohydrate. Check your blood glucose 15 minutes after the snack to make sure that your glucose level is above 100 mg/dL (5.6 mmol/L) before you start your exercise. ?Know the symptoms of low blood glucose (hypoglycemia) and how to treat it. Your risk for hypoglycemia increases during and after exercise. ?Follow these tips and your health care provider's instructions ?Keep a carbohydrate snack that is fast-acting for use before, during, and after exercise to help prevent or treat hypoglycemia. ?Avoid injecting insulin into areas of the body that are  going to be exercised. For example, avoid injecting insulin into: ?Your arms, when you are about to play tennis. ?Your legs, when you are about to go jogging. ?Keep records of your exercise habits. Doing this can help you and your health care provider adjust your diabetes management plan as needed. Write down: ?Food that you eat before and after you exercise. ?Blood glucose levels before and after you exercise. ?The type and amount of exercise you have done. ?Work with your health care provider when you start a new exercise or activity. He or she may need to: ?Make sure that the activity is safe for you. ?Adjust your insulin, other medicines, and food that you eat. ?Drink plenty of water while you exercise. This prevents loss of water (dehydration) and problems caused by a lot of heat in the body (heat stroke). ?Where to find more information ?American Diabetes Association: www.diabetes.org ?Summary ?Exercising regularly is important for overall health, especially for people who have diabetes mellitus. ?Exercising has many health benefits. It increases muscle strength and bone density and reduces body fat and stress. It also lowers and controls blood glucose. ?Your health care provider or certified diabetes educator can help you make an activity plan for the type and frequency of exercise that works for you. ?Work with your health care provider to make sure any new activity is safe for you. Also work with your health care  provider to adjust your insulin, other medicines, and the food you eat. ?This information is not intended to replace advice given to you by your health care provider. Make sure you discuss any questions you have with your health care provider. ?Document Revised: 12/18/2018 Document Reviewed: 12/18/2018 ?Elsevier Patient Education ? [place]. ? ? ?

## 2021-08-13 NOTE — Chronic Care Management (AMB) (Signed)
Care Management    RN Visit Note  08/13/2021 Name: Sylvia Conner MRN: [id number] DOB: 1956/02/25  Subjective: Sylvia Conner is a 66 y.o. year old female who is a primary care Sylvia of Burns, Bobette Mo, MD. The care management team was consulted for assistance with disease management and care coordination needs.    Engaged with Sylvia by telephone for follow up visit in response to provider referral for case management and/or care coordination services.   Consent to Services:   Sylvia Conner was given information 07/09/21 about Care Management services including:  Care Management services includes personalized support from designated clinical staff supervised by her physician, including individualized plan of care and coordination with other care providers 24/7 contact phone numbers for assistance for urgent and routine care needs. The Sylvia may stop case management services at any time by phone call to the office staff.  Sylvia agreed to services and consent obtained.   Assessment: Review of Sylvia past medical history, allergies, medications, health status, including review of consultants reports, laboratory and other test data, was performed as part of comprehensive evaluation and provision of chronic care management services.  Care Plan  Allergies  Allergen Reactions   Penicillins Anaphylaxis and Rash    Has Sylvia had a PCN reaction causing immediate rash, facial/tongue/throat swelling, SOB or lightheadedness with hypotension: Yes Has Sylvia had a PCN reaction causing severe rash involving mucus membranes or skin necrosis: No Has Sylvia had a PCN reaction that required hospitalization Yes Has Sylvia had a PCN reaction occurring within the last 10 years: No If all of the above answers are "NO", then may proceed with Cephalosporin use.    Glipizide     Breakout on arms   Januvia [Sitagliptin] Nausea Only   Sulfa Antibiotics Swelling   Scallops [Shellfish Allergy] Hives    Outpatient Encounter Medications as of 08/12/2021  Medication Sig   blood glucose meter kit and supplies KIT Dispense based on Sylvia and insurance preference. Use up to four times daily as directed. (FOR E11.9).   levothyroxine (SYNTHROID) 137 MCG tablet Take 1 tablet (137 mcg total) by mouth daily before breakfast. 6 days a week.  Take 0.5 tablet PO one day a week   meloxicam (MOBIC) 15 MG tablet Take 1 tablet (15 mg total) by mouth daily. (Sylvia taking differently: Take 15 mg by mouth daily as needed.)   metFORMIN (GLUCOPHAGE) 1000 MG tablet TAKE 1 TABLET BY MOUTH TWICE DAILY WITH A MEAL   Multiple Vitamins-Minerals (HAIR SKIN AND NAILS FORMULA PO) Take 1 tablet by mouth daily.   pioglitazone (ACTOS) 30 MG tablet Take 1 tablet (30 mg total) by mouth daily.   potassium chloride SA (KLOR-CON) 20 MEQ tablet Take 2 tablets (40 mEq total) by mouth daily.   rosuvastatin (CRESTOR) 10 MG tablet Take 1 tablet (10 mg total) by mouth daily.   Semaglutide, 1 MG/DOSE, (OZEMPIC, 1 MG/DOSE,) 2 MG/1.5ML SOPN Inject 1 mg into the skin once a week. Dx DM 2   traMADol (ULTRAM) 50 MG tablet Take 1 tablet (50 mg total) by mouth every 6 (six) hours as needed for moderate pain or severe pain.   triamcinolone cream (KENALOG) 0.5 % Apply topically 2 (two) times daily as needed.   triamterene-hydrochlorothiazide (MAXZIDE) 75-50 MG tablet TAKE 1 TABLET BY MOUTH ONCE DAILY IN THE MORNING   No facility-administered encounter medications on file as of 08/12/2021.   Sylvia Active Problem List   Diagnosis Date Noted   Skin cyst 04/10/2021  PND (post-nasal drip) 03/02/2021   Breast pain, right 08/18/2020   Arthralgia 06/03/2020   Right-sided chest pain 06/03/2020   Constipation 12/27/2019   Pruritus 08/08/2019   Episcleritis of left eye 06/21/2019   Hyperlipidemia 11/30/2018   Lipoma of arm 05/03/2017   Eczema, feet 12/24/2016   Left lower quadrant pain 05/04/2016   Right shoulder pain 10/31/2015   Adnexal  mass 10/31/2015   HTN (hypertension) 05/24/2011   DM (diabetes mellitus) (HCC) 05/24/2011   Hypothyroidism 05/24/2011   History of hysterectomy 05/24/2011   Conditions to be addressed/monitored: HTN and DMII  Care Plan : RN Care Manager Plan of Care  Updates made by Sylvia Corner, RN since 08/13/2021 12:00 AM     Problem: Chronic Disease Management Needs   Priority: High     Long-Range Goal: Ongoing adherence to established plan of care for long term chronic disease management   Start Date: 07/13/2021  Expected End Date: 07/14/2022  Priority: High  Note:   Current Barriers:  Chronic Disease Management support and education needs related to DMII  RNCM Clinical Goal(s):  Sylvia will demonstrate ongoing health management independence as evidenced by adherence to plan of care for self-management of DMII        through collaboration with RN Care manager, provider, and care team.   Interventions: 1:1 collaboration with primary care provider regarding development and update of comprehensive plan of care as evidenced by provider attestation and co-signature Inter-disciplinary care team collaboration (see longitudinal plan of care) RN CM Initial assessment completed 07/13/21 Evaluation of current treatment plan related to  self management and Sylvia's adherence to plan as established by provider Review of Sylvia status, including review of consultants reports, relevant laboratory and other test results, and medications completed Pain assessment updated: denies pain today Medications discussed: reports continues to independently self-manage and denies current concerns/ issues/ questions around medications; endorses adherence to taking all medications as prescribed Discussed recent outpatient surgery for lipoma excision- reports tolerated well, denies post-procedure concerns Reviewed upcoming scheduled provider appointments: 10/30/21- PCP; Sylvia confirms is aware of and has plans to attend  as scheduled Discussed plans with Sylvia for ongoing care management follow up and provided Sylvia with direct contact information for care management team     Diabetes/ HTN:  (Status: 08/12/21: Goal on Track (progressing): YES.) Long Term Goal   Lab Results  Component Value Date   HGBA1C 8.0 (H) 06/30/2021    Provided education to Sylvia about basic DM disease process; Reviewed Sylvia's recent fasting blood sugars; reports general ranges consistently between 170-250; reinforced previously provided education around general goals for fasting blood sugars-- between 120-130; she reports fasting blood sugar this morning of 196 Reinforced previously provided education around value of alternating daily blood sugar monitoring several times per week, to include 2-hour post-prandial check-- she has started doing this as advised at time of last outreach; reinforced previously provided education around general post-prandial goals-- less than 180; today she reports consistent general post-prandial ranges between 230-290 Confirmed with Sylvia no low blood sugar values at home Confirmed continues taking Ozempic weekly (obtained form pharmacy quickly she reports), metformin, actos Reinforced previously provided education/ strategies for simple dietary changes she could consider; she has continues not drinking soda's and reading nutritional labels; encouraged her to make small, simple, gradual changes over time for best results: confirmed Sylvia received and is reviewing printed educational material mailed to her at time of initial outreach Reminded Sylvia to schedule annual 2023 annual eye exam- she  reported due in July Confirmed Sylvia continues not routinely/ regularly monitoring blood pressures at home; she reports occasionally monitors when she goes to outpatient pharmacy/ stores with automatic cuffs-- states when she does monitor, "they all seem in normal range;" discussed with Sylvia PCP general  concern at last office visit around blood pressure-- encouraged her to periodically monitor and to write down on paper if possible to share with PCP at next office visit-- she will do Sylvia is very excited to tell me today that she has started walking outside "about 3 times per week," for exercise-- states she "feels so much better" since she began walking; we discussed the overall benefit of regular structured exercise in setting of both DMII/ HTN- I encouraged her to continue walking regularly and also to consider contacting her insurance provider to inquire about fitness benefit  Sylvia Goals/Self-Care Activities: As evidenced by review of EHR, collaboration with care team, and Sylvia reporting during CCM RN CM outreach,   Sylvia Conner will: Take medications as prescribed Attend all scheduled provider appointments Call pharmacy for medication refills Call provider office for new concerns or questions Continue to check fasting (first thing in the morning, before eating) blood sugars at home several times each week: your goal for this blood sugar value is between 120-130 Continue to periodically alternate checking your blood sugars 2 hours after eating a regular size meal, several times each week: your goal for this blood sugar value is: less than 180 Continue to make effort to follow heart healthy, low salt, low cholesterol, carbohydrate-modified, low sugar diet: keep thinking of strategies you can try at home, as we discussed today, to decrease the amount of carbohydrates and sugar you eat-- keep up the great work reviewing the educational booklet about Diabetes that I mailed to you-- try to make small changes every week for lasting change to take effect Try to check your blood pressure when you are out in the community and have access to a blood pressure monitor-- when you check your blood pressure, try to write it down on paper so you can show to Dr. Lawerance Bach when you go for your next office  visit Keep up the great work preventing falls Keep up the great work at walking outside for exercise-- regular, structured activity and exercise is a great way to help control both your blood sugar and your blood pressure    Plan:  Telephone follow up appointment with care management team member scheduled for:  Friday, September 11, 2021 at 10:00 am The Sylvia has been provided with contact information for the care management team and has been advised to call with any health related questions or concerns  Caryl Pina, RN, BSN, CCRN Alumnus [facility] RN Care Coordination- [facility] [phone]: direct office

## 2021-09-08 ENCOUNTER — Encounter: Payer: Self-pay | Admitting: Internal Medicine

## 2021-09-08 NOTE — Progress Notes (Unsigned)
Subjective:    Patient ID: Sylvia Conner, female    DOB: 20-Jan-1956, 66 y.o.   MRN: [id number]     HPI Pheobe is here for follow up of her chronic medical problems, including DM, htn, hypothyroid, hld   She is getting full faster with the ozempic.   She has decreased appetite.  She walks daily 15 minutes.    Medications and allergies reviewed with patient and updated if appropriate.  Current Outpatient Medications on File Prior to Visit  Medication Sig Dispense Refill   blood glucose meter kit and supplies KIT Dispense based on patient and insurance preference. Use up to four times daily as directed. (FOR E11.9). 1 each 0   levothyroxine (SYNTHROID) 137 MCG tablet Take 1 tablet (137 mcg total) by mouth daily before breakfast. 6 days a week.  Take 0.5 tablet PO one day a week 90 tablet 1   meloxicam (MOBIC) 15 MG tablet Take 1 tablet (15 mg total) by mouth daily. (Patient taking differently: Take 15 mg by mouth daily as needed.) 20 tablet 0   metFORMIN (GLUCOPHAGE) 1000 MG tablet TAKE 1 TABLET BY MOUTH TWICE DAILY WITH A MEAL 180 tablet 0   Multiple Vitamins-Minerals (HAIR SKIN AND NAILS FORMULA PO) Take 1 tablet by mouth daily.     pioglitazone (ACTOS) 30 MG tablet Take 1 tablet (30 mg total) by mouth daily. 30 tablet 5   potassium chloride SA (KLOR-CON) 20 MEQ tablet Take 2 tablets (40 mEq total) by mouth daily. 180 tablet 1   rosuvastatin (CRESTOR) 10 MG tablet Take 1 tablet (10 mg total) by mouth daily. 90 tablet 3   Semaglutide, 1 MG/DOSE, (OZEMPIC, 1 MG/DOSE,) 2 MG/1.5ML SOPN Inject 1 mg into the skin once a week. Dx DM 2 9 mL 3   traMADol (ULTRAM) 50 MG tablet Take 1 tablet (50 mg total) by mouth every 6 (six) hours as needed for moderate pain or severe pain. 20 tablet 0   triamcinolone cream (KENALOG) 0.5 % Apply topically 2 (two) times daily as needed. 30 g 1   triamterene-hydrochlorothiazide (MAXZIDE) 75-50 MG tablet TAKE 1 TABLET BY MOUTH ONCE DAILY IN THE MORNING 90  tablet 0   No current facility-administered medications on file prior to visit.     Review of Systems  Constitutional:  Negative for fever.  Respiratory:  Negative for cough, shortness of breath and wheezing.   Cardiovascular:  Negative for chest pain, palpitations and leg swelling.  Neurological:  Negative for light-headedness and headaches.  Psychiatric/Behavioral:  Positive for sleep disturbance (at times difficulty falling asleep).       Objective:   Vitals:   09/09/21 1302  BP: 134/82  Pulse: 81  Temp: 98.3 F (36.8 C)  SpO2: 98%   BP Readings from Last 3 Encounters:  09/09/21 134/82  07/28/21 (!) 141/76  06/30/21 (!) 144/84   Wt Readings from Last 3 Encounters:  09/09/21 149 lb (67.6 kg)  07/28/21 156 lb 4.9 oz (70.9 kg)  06/30/21 153 lb 12.8 oz (69.8 kg)   Body mass index is 26.82 kg/m.    Physical Exam Constitutional:      General: She is not in acute distress.    Appearance: Normal appearance.  HENT:     Head: Normocephalic and atraumatic.  Eyes:     Conjunctiva/sclera: Conjunctivae normal.  Cardiovascular:     Rate and Rhythm: Normal rate and regular rhythm.     Heart sounds: Normal heart sounds. No murmur  heard. Pulmonary:     Effort: Pulmonary effort is normal. No respiratory distress.     Breath sounds: Normal breath sounds. No wheezing.  Musculoskeletal:     Cervical back: Neck supple.     Right lower leg: No edema.     Left lower leg: No edema.  Lymphadenopathy:     Cervical: No cervical adenopathy.  Skin:    General: Skin is warm and dry.     Findings: No rash.  Neurological:     Mental Status: She is alert. Mental status is at baseline.  Psychiatric:        Mood and Affect: Mood normal.        Behavior: Behavior normal.       Lab Results  Component Value Date   WBC 4.7 06/30/2021   HGB 12.4 06/30/2021   HCT 38.4 06/30/2021   PLT 309.0 06/30/2021   GLUCOSE 167 (H) 06/30/2021   CHOL 231 (H) 06/30/2021   TRIG 79.0 06/30/2021    HDL 87.20 06/30/2021   LDLCALC 128 (H) 06/30/2021   ALT 31 06/30/2021   AST 30 06/30/2021   NA 135 06/30/2021   K 3.4 (L) 06/30/2021   CL 97 06/30/2021   CREATININE 0.75 06/30/2021   BUN 17 06/30/2021   CO2 28 06/30/2021   TSH 0.03 (L) 06/30/2021   HGBA1C 8.0 (H) 06/30/2021   MICROALBUR 0.8 01/12/2021     Assessment & Plan:    See Problem List for Assessment and Plan of chronic medical problems.

## 2021-09-08 NOTE — Patient Instructions (Addendum)
     Blood work was ordered.     Medications changes include :  none       Return in about 4 months (around 01/09/2022) for follow up.

## 2021-09-09 ENCOUNTER — Ambulatory Visit (INDEPENDENT_AMBULATORY_CARE_PROVIDER_SITE_OTHER): Payer: 59 | Admitting: Internal Medicine

## 2021-09-09 VITALS — BP 134/82 | HR 81 | Temp 98.3°F | Ht 62.5 in | Wt 149.0 lb

## 2021-09-09 DIAGNOSIS — Z794 Long term (current) use of insulin: Secondary | ICD-10-CM

## 2021-09-09 DIAGNOSIS — E1165 Type 2 diabetes mellitus with hyperglycemia: Secondary | ICD-10-CM | POA: Diagnosis not present

## 2021-09-09 DIAGNOSIS — E782 Mixed hyperlipidemia: Secondary | ICD-10-CM

## 2021-09-09 DIAGNOSIS — I10 Essential (primary) hypertension: Secondary | ICD-10-CM | POA: Diagnosis not present

## 2021-09-09 DIAGNOSIS — E039 Hypothyroidism, unspecified: Secondary | ICD-10-CM

## 2021-09-09 LAB — LIPID PANEL
Cholesterol: 221 mg/dL — ABNORMAL HIGH (ref 0–200)
HDL: 75.6 mg/dL (ref >39.00)
LDL Cholesterol: 125 mg/dL — ABNORMAL HIGH (ref 0–99)
NonHDL: 144.94
Total CHOL/HDL Ratio: 3
Triglycerides: 100 mg/dL (ref 0.0–149.0)
VLDL: 20 mg/dL (ref 0.0–40.0)

## 2021-09-09 LAB — COMPREHENSIVE METABOLIC PANEL
ALT: 15 U/L (ref 0–35)
AST: 18 U/L (ref 0–37)
Albumin: 4.3 g/dL (ref 3.5–5.2)
Alkaline Phosphatase: 105 U/L (ref 39–117)
BUN: 11 mg/dL (ref 6–23)
CO2: 30 mEq/L (ref 19–32)
Calcium: 10 mg/dL (ref 8.4–10.5)
Chloride: 97 mEq/L (ref 96–112)
Creatinine, Ser: 0.71 mg/dL (ref 0.40–1.20)
GFR: 89.79 mL/min (ref >60.00)
Glucose, Bld: 136 mg/dL — ABNORMAL HIGH (ref 70–99)
Potassium: 3.4 mEq/L — ABNORMAL LOW (ref 3.5–5.1)
Sodium: 136 mEq/L (ref 135–145)
Total Bilirubin: 0.6 mg/dL (ref 0.2–1.2)
Total Protein: 7.9 g/dL (ref 6.0–8.3)

## 2021-09-09 LAB — TSH: TSH: 0.01 u[IU]/mL — ABNORMAL LOW (ref 0.35–5.50)

## 2021-09-09 LAB — HEMOGLOBIN A1C: Hgb A1c MFr Bld: 8.8 % — ABNORMAL HIGH (ref 4.6–6.5)

## 2021-09-09 NOTE — Assessment & Plan Note (Signed)
Chronic Blood pressure well controlled CMP Continue triamterene-hydrochlorothiazide 75-50 mg daily 

## 2021-09-09 NOTE — Assessment & Plan Note (Signed)
Chronic  Clinically euthyroid Currently taking levothyroxine 137 mcg daily 6 days a week, 68.5 mcg daily 1 day a week Check tsh  Titrate med dose if needed

## 2021-09-09 NOTE — Assessment & Plan Note (Addendum)
Chronic  Lab Results  Component Value Date   HGBA1C 8.0 (H) 06/30/2021   Sugars not controlled Sugars still high but less than 200 Testing sugars 2 times a day Check A1c Continue Ozempic 1 mg weekly dose 30 mg daily, metformin 1000 mg twice daily Stressed regular exercise, diabetic diet May need to adjust medications further depending on A1c

## 2021-09-09 NOTE — Assessment & Plan Note (Signed)
Chronic Regular exercise and healthy diet encouraged Check lipid panel  Continue Crestor 10 mg daily 

## 2021-09-11 ENCOUNTER — Ambulatory Visit: Payer: 59 | Admitting: *Deleted

## 2021-09-11 DIAGNOSIS — E1165 Type 2 diabetes mellitus with hyperglycemia: Secondary | ICD-10-CM

## 2021-09-11 NOTE — Patient Instructions (Signed)
Visit Information  Lauris, thank you for taking time to talk with me today. Please don't hesitate to contact me if I can be of assistance to you before our next scheduled telephone appointment  Below are the goals we discussed today:  Patient Self-Care Activities: Patient Sylvia Conner will: Take medications as prescribed Attend all scheduled provider appointments Call pharmacy for medication refills Call provider office for new concerns or questions Continue to check fasting (first thing in the morning, before eating) blood sugars at home several times each week: your goal for this blood sugar value is between 120-130 Continue to periodically alternate checking your blood sugars 2 hours after eating a regular size meal, several times each week: your goal for this blood sugar value is: less than 180 Continue to make effort to follow heart healthy, low salt, low cholesterol, carbohydrate-modified, low sugar diet: keep thinking of strategies you can try at home, as we discussed today, to decrease the amount of carbohydrates and sugar you eat-- keep up the great work reviewing the educational booklet about Diabetes that I mailed to you-- try to make small changes every week for lasting change to take effect Try to check your blood pressure when you are out in the community and have access to a blood pressure monitor-- when you check your blood pressure, try to write it down on paper so you can show to Dr. Quay Burow when you go for your next office visit Keep up the great work preventing falls Keep up the great work at walking outside for exercise-- regular, structured activity and exercise is a great way to help control both your blood sugar and your blood pressure  Our next scheduled telephone follow up visit/ appointment is scheduled on: Wednesday, October 28, 2021 at 10:30 am- This is a PHONE [place] appointment  If you need to cancel or re-schedule our visit, please call [phone] and our care guide team will  be happy to assist you.   I look forward to hearing about your progress.   Oneta Rack, RN, BSN, [place] [phone]: direct office  If you are experiencing a Mental Health or [place] or need someone to talk to, please  call the Suicide and Crisis Lifeline: 988 call the [place] National Suicide Prevention Lifeline: (680)642-1273 or TTY: (901)670-8287 TTY 959-434-4080) to talk to a trained counselor call 1-800-273-TALK (toll free, 24 hour hotline) go to [facility] [address] [place] [phone]) call 911   The patient verbalized understanding of instructions, educational materials, and care plan provided today and agreed to receive a mailed copy of patient instructions, educational materials, and care plan  Living With Diabetes Diabetes (type 1 diabetes mellitus or type 2 diabetes mellitus) is a condition in which the body does not have enough of a hormone called insulin, or the body does not respond properly to insulin. Normally, insulin allows sugars (glucose) to enter cells in the body. With diabetes, extra glucose builds up in the blood instead of going into cells. This results in high blood glucose (hyperglycemia). How to manage lifestyle changes Managing diabetes includes medical treatments as well as lifestyle changes. If diabetes is not managed well, serious physical and emotional complications can occur. Taking good care of yourself means that you are responsible for: Monitoring glucose regularly. Eating a healthy diet. Exercising regularly. Meeting with health care providers. Taking medicines as directed. Most people feel some stress about managing their  diabetes. When this stress becomes too much, it is known as diabetes-related distress. This is very common. Living with diabetes can place you at risk for diabetes distress, depression, or anxiety.  These disorders can make diabetes more difficult to manage. How to recognize stress You may have diabetes distress if you: Avoid or ignore your daily diabetes care. This includes glucose testing, following a meal plan, and taking medications. Feel overwhelmed by your daily diabetes care. Experience emotional reactions such as anger, sadness, or fear related to your daily diabetes care. Feel fear or shame about not doing everything perfectly that you have been told to do. Emotional distress Symptoms of diabetes distress include: Anger about having a diagnosis of diabetes. Fear or frustration about your diagnosis and the changes you need to make to manage the condition. Being overly worried about the care that you need or the cost of the care that you need. Feeling like you caused your condition by doing something wrong. Fear about unpredictable fluctuations in your blood glucose, like low or high blood glucose. Feeling judged by your health care providers. Feeling very alone with the disease. Depression Having diabetes means that you are at a higher risk for depression. Your health care provider may test (screen) you for symptoms of depression. It is important to recognize symptoms and to start treatment for depression soon after it is diagnosed. The following are some symptoms of depression: Loss of interest in things that you used to enjoy. Feeling depressed much or most of the time. A change in appetite. Trouble getting to sleep or staying asleep. Feeling tired most of the day. Feeling nervous and anxious. Feeling guilty and worrying that you are a burden to others. Having thoughts of hurting yourself or feeling that you want to die. If you have any of these symptoms, more days than not, for 2 weeks or longer, you may have depression. This would be a good time to contact your health care provider. Follow these instructions at home: Managing diabetes distress The following are some ways  to manage emotional distress: Learn as much as you can about diabetes and its treatment. Take one step at a time to improve your management. Meet with a certified diabetes care and education specialist. Take a class to learn how to manage your condition. Consider working with a counselor or therapist. Keep a journal of your thoughts and concerns. Accept that some things are out of your control. Talk with other people who have diabetes. It can help to talk about the distress that you feel. Find ways to manage stress that work for you. These may include art or music therapy, exercise, meditation, and hobbies. Seek support from spiritual leaders, family, and friends.  General instructions Do your best to follow your diabetes management plan. If you are struggling to follow your plan, talk with a certified diabetes care and education specialist, or with someone else who has diabetes. They may have ideas that will help. Forgive yourself for not being perfect. Almost everyone struggles with the tasks of diabetes. Keep all follow-up visits. This is important. Where to find support Search for information and support from the American Diabetes Association: www.diabetes.org Find a certified diabetes education and care specialist. Make an appointment through the Association of Diabetes Care & Education Specialists: www.diabeteseducator.org Contact a health care provider if: You believe your diabetes is getting out of control. You are concerned you may be depressed. You think your medications are not helping control your diabetes. You are feeling  overwhelmed with your diabetes. Get help right away if: You have thoughts about hurting yourself or others. If you ever feel like you may hurt yourself or others, or have thoughts about taking your own life, get help right away. You can go to your nearest emergency department or call: Your local emergency services (911 in the [place] A suicide crisis helpline,  such as the [facility] at 424-067-4798 or 988 in the [place]. This is open 24 hours a day. Summary Diabetes (type 1 diabetes mellitus or type 2 diabetes mellitus) is a condition in which the body does not have enough of a hormone called insulin, or the body does not respond properly to insulin. Living with diabetes puts you at risk for medical and emotional issues, such as diabetes distress, depression, and anxiety. Recognizing the symptoms of diabetes distress and depression may help you avoid problems with your diabetes control. If you experience symptoms, it is important to discuss this with your health care provider, certified diabetes care and education specialist, or therapist. It is important to start treatment for diabetes distress and depression soon after diagnosis. Ask your health care provider to recommend a therapist who understands both depression and diabetes. This information is not intended to replace advice given to you by your health care provider. Make sure you discuss any questions you have with your health care provider. Document Revised: 10/15/2020 Document Reviewed: 08/02/2019 Elsevier Patient Education  [employer].   Hemoglobin A1C Test Why am I having this test? You may have the hemoglobin A1C test (A1C test) done to: Evaluate your risk for developing diabetes (diabetes mellitus). Diagnose diabetes. Monitor long-term control of blood sugar (glucose) in people who have diabetes and help make treatment decisions. This test may be done with other blood glucose tests, such as fasting blood glucose and oral glucose tolerance tests. What is being tested? Hemoglobin is a type of protein in the blood that carries oxygen. Glucose attaches to hemoglobin to form glycated hemoglobin. This test checks the amount of glycated hemoglobin in your blood, which is a good indicator of the average amount of glucose in your blood during the past 2-3  months. What kind of sample is taken?  A blood sample is required for this test. It is usually collected by inserting a needle into a blood vessel. Tell a health care provider about: All medicines you are taking, including vitamins, herbs, eye drops, creams, and over-the-counter medicines. Any blood disorders you have. Any surgeries you have had. Any medical conditions you have. Whether you are pregnant or may be pregnant. How are the results reported? Your results will be reported as a percentage that indicates how much of your hemoglobin has glucose attached to it (is glycated). Your health care provider will compare your results to normal ranges that were established after testing a large group of people (reference ranges). Reference ranges may vary among labs and hospitals. For this test, common reference ranges are: Adult or child without diabetes: 4-5.6%. Adult or child with diabetes and good blood glucose control: less than 7%. What do the results mean? If you have diabetes: A result of less than 7% is considered normal, meaning that your blood glucose is well controlled. A result higher than 7% means that your blood glucose is not well controlled, and your treatment plan may need to be adjusted. If you do not have diabetes: A result within the reference range is considered normal, meaning that you are not at high risk  for diabetes. A result of 5.7-6.4% means that you have a high risk of developing diabetes, and you have prediabetes. Prediabetes is the condition of having a blood glucose level that is higher than it should be but not high enough for you to be diagnosed with diabetes. Having prediabetes puts you at risk for developing type 2 diabetes. You may have more tests, including a repeat A1C test. Results of 6.5% or higher on two separate A1C tests mean that you have diabetes. You may have more tests to confirm the diagnosis. Abnormally low A1C values may be caused  by: Pregnancy. Severe blood loss. Receiving donated blood (transfusions). Low red blood cell count (anemia). Long-term kidney failure. Some unusual forms (variants) of hemoglobin. Talk with your health care provider about what your results mean. Questions to ask your health care provider Ask your health care provider, or the department that is doing the test: When will my results be ready? How will I get my results? What are my treatment options? What other tests do I need? What are my next steps? Summary The A1C test may be done to evaluate your risk for developing diabetes, to diagnose diabetes, and to monitor long-term control of blood sugar (glucose) in people who have diabetes and help make treatment decisions. Hemoglobin is a type of protein in the blood that carries oxygen. Glucose attaches to hemoglobin to form glycated hemoglobin. This test checks the amount of glycated hemoglobin in your blood, which is a good indicator of the average amount of glucose in your blood during the past 2-3 months. Talk with your health care provider about what your results mean. This information is not intended to replace advice given to you by your health care provider. Make sure you discuss any questions you have with your health care provider. Document Revised: 12/19/2019 Document Reviewed: 12/19/2019 Elsevier Patient Education  2022 [employer].

## 2021-09-11 NOTE — Chronic Care Management (AMB) (Signed)
Care Management    RN Visit Note  09/11/2021 Name: Sylvia Conner MRN: [id number] DOB: March 10, 1956  Subjective: Sylvia Conner is a 66 y.o. year old female who is a primary care patient of Burns, Claudina Lick, MD. The care management team was consulted for assistance with disease management and care coordination needs.    Engaged with patient by telephone for follow up visit in response to provider referral for case management and/or care coordination services.   Consent to Services:   [name] was given information about Care Management services 07/09/21 including:  Care Management services includes personalized support from designated clinical staff supervised by her physician, including individualized plan of care and coordination with other care providers 24/7 contact phone numbers for assistance for urgent and routine care needs. The patient may stop case management services at any time by phone call to the office staff.  Patient agreed to services and consent obtained.   Assessment: Review of patient past medical history, allergies, medications, health status, including review of consultants reports, laboratory and other test data, was performed as part of comprehensive evaluation and provision of chronic care management services.   SDOH (Social Determinants of Health) assessments and interventions performed:  SDOH Interventions    Flowsheet Row Most Recent Value  SDOH Interventions   Transportation Interventions Intervention Not Indicated  [continues to drive self]      Care Plan  Allergies  Allergen Reactions   Penicillins Anaphylaxis and Rash    Has patient had a PCN reaction causing immediate rash, facial/tongue/throat swelling, SOB or lightheadedness with hypotension: Yes Has patient had a PCN reaction causing severe rash involving mucus membranes or skin necrosis: No Has patient had a PCN reaction that required hospitalization Yes Has patient had a PCN reaction occurring  within the last 10 years: No If all of the above answers are "NO", then may proceed with Cephalosporin use.    Glipizide     Breakout on arms   Januvia [Sitagliptin] Nausea Only   Sulfa Antibiotics Swelling   Scallops [Shellfish Allergy] Hives   Outpatient Encounter Medications as of 09/11/2021  Medication Sig   blood glucose meter kit and supplies KIT Dispense based on patient and insurance preference. Use up to four times daily as directed. (FOR E11.9).   levothyroxine (SYNTHROID) 137 MCG tablet Take 1 tablet (137 mcg total) by mouth daily before breakfast. 6 days a week.  Take 0.5 tablet PO one day a week   meloxicam (MOBIC) 15 MG tablet Take 1 tablet (15 mg total) by mouth daily. (Patient taking differently: Take 15 mg by mouth daily as needed.)   metFORMIN (GLUCOPHAGE) 1000 MG tablet TAKE 1 TABLET BY MOUTH TWICE DAILY WITH A MEAL   Multiple Vitamins-Minerals (HAIR SKIN AND NAILS FORMULA PO) Take 1 tablet by mouth daily.   pioglitazone (ACTOS) 30 MG tablet Take 1 tablet (30 mg total) by mouth daily.   potassium chloride SA (KLOR-CON) 20 MEQ tablet Take 2 tablets (40 mEq total) by mouth daily.   rosuvastatin (CRESTOR) 10 MG tablet Take 1 tablet (10 mg total) by mouth daily.   Semaglutide, 1 MG/DOSE, (OZEMPIC, 1 MG/DOSE,) 2 MG/1.5ML SOPN Inject 1 mg into the skin once a week. Dx DM 2   traMADol (ULTRAM) 50 MG tablet Take 1 tablet (50 mg total) by mouth every 6 (six) hours as needed for moderate pain or severe pain.   triamcinolone cream (KENALOG) 0.5 % Apply topically 2 (two) times daily as needed.   triamterene-hydrochlorothiazide (  MAXZIDE) 75-50 MG tablet TAKE 1 TABLET BY MOUTH ONCE DAILY IN THE MORNING   No facility-administered encounter medications on file as of 09/11/2021.   Patient Active Problem List   Diagnosis Date Noted   Skin cyst 04/10/2021   PND (post-nasal drip) 03/02/2021   Breast pain, right 08/18/2020   Arthralgia 06/03/2020   Right-sided chest pain 06/03/2020    Constipation 12/27/2019   Pruritus 08/08/2019   Episcleritis of left eye 06/21/2019   Hyperlipidemia 11/30/2018   Lipoma of arm 05/03/2017   Eczema, feet 12/24/2016   Left lower quadrant pain 05/04/2016   Right shoulder pain 10/31/2015   Adnexal mass 10/31/2015   HTN (hypertension) 05/24/2011   DM (diabetes mellitus) ([place]) 05/24/2011   Hypothyroidism 05/24/2011   History of hysterectomy 05/24/2011   Conditions to be addressed/monitored: HTN and DMII  Care Plan : RN Care Manager Plan of Care  Updates made by Knox Royalty, RN since 09/11/2021 12:00 AM     Problem: Chronic Disease Management Needs   Priority: High     Long-Range Goal: Ongoing adherence to established plan of care for long term chronic disease management   Start Date: 07/13/2021  Expected End Date: 07/14/2022  Priority: High  Note:   Current Barriers:  Chronic Disease Management support and education needs related to DMII  RNCM Clinical Goal(s):  Patient will demonstrate ongoing health management independence as evidenced by adherence to plan of care for self-management of DMII        through collaboration with RN Care manager, provider, and care team  Interventions: 1:1 collaboration with primary care provider regarding development and update of comprehensive plan of care as evidenced by provider attestation and co-signature Inter-disciplinary care team collaboration (see longitudinal plan of care) RN CM Initial assessment completed 07/13/21 Evaluation of current treatment plan related to  self management and patient's adherence to plan as established by provider Review of patient status, including review of consultants reports, relevant laboratory and other test results, and medications completed SDOH updated: no new/ unmet concerns identified Pain assessment updated: denies pain Falls assessment updated: continues to deny new/ recent falls x 12 months- not currently using assistive devices;  positive  reinforcement provided with encouragement to continue efforts at fall prevention; previously provided education around fall risks/ prevention reinforced Medications discussed: reports continues to independently self-manage and denies current concerns/ issues/ questions around medications; endorses adherence to taking all medications as prescribed Reviewed recent PCP office visit 09/09/21: patient verbalizes good understanding of post-office visit instructions and denies questions; reviewed lab values from office visit and provided explanation aorund significance of results Reviewed upcoming scheduled provider appointments: 01/12/22- PCP; patient confirms is aware of all and has plans to attend as scheduled Discussed plans with patient for ongoing care management follow up and provided patient with direct contact information for care management team     Diabetes/ HTN:  (Status: 09/11/21: Goal on Track (progressing): YES.) Long Term Goal   Lab Results  Component Value Date   HGBA1C 8.8 (H) 09/09/2021    Provided education to patient about basic DM disease process; Reviewed patient's recent fasting blood sugars; reports general ranges consistently between 170-223; reinforced previously provided education around general goals for fasting blood sugars-- between 120-130; she reports fasting blood sugar this morning of 193-- reports consistent fasting blood sugars at "about 200" Reinforced previously provided education around value of alternating daily blood sugar monitoring several times per week, to include 2-hour post-prandial check-- she has started doing this periodically  as advised at time of last outreach; reinforced previously provided education around general post-prandial goals-- less than 180; today she reports consistent general post-prandial ranges between 203-223-- reviewed post-prandial value from labs at PCP office visit: 136 Confirmed with patient no low blood sugar values at home Assessed  patient's understanding of meaning/ significance of A1-C values: fair-good baseline understanding of same- Reviewed individual historical A1-C trends and provided education around correlation of A1-C value to blood sugar levels at home over 3 months- she will benefit form ongoing education, reinforcement, support, and reminding Reinforced previously provided education/ strategies for simple dietary changes she could consider; she has continued not drinking soda's and reading nutritional labels; encouraged her to make small, simple, gradual changes over time for best results: confirmed patient received and continues reviewing printed educational material mailed to her at time of initial outreach Reminded patient again to schedule annual 2023 annual eye exam- she reported due in July Confirmed patient continues not routinely/ regularly monitoring blood pressures at home; continues occasionally monitoring when she goes to outpatient pharmacy/ stores with automatic cuffs-- this was encouraged Reports has continues started walking outside for exercise  "about 3 times per week," -- states she "feels so much better, have a lot energy" since she began walking; reinforced overall benefit of regular structured exercise in setting of both DMII/ HTN- I encouraged her to continue walking regularly and again to consider contacting her insurance provider to inquire about fitness benefit  Patient Goals/Self-Care Activities: As evidenced by review of EHR, collaboration with care team, and patient reporting during CCM RN CM outreach,   Patient Brytani will: Take medications as prescribed Attend all scheduled provider appointments Call pharmacy for medication refills Call provider office for new concerns or questions Continue to check fasting (first thing in the morning, before eating) blood sugars at home several times each week: your goal for this blood sugar value is between 120-130 Continue to periodically alternate  checking your blood sugars 2 hours after eating a regular size meal, several times each week: your goal for this blood sugar value is: less than 180 Continue to make effort to follow heart healthy, low salt, low cholesterol, carbohydrate-modified, low sugar diet: keep thinking of strategies you can try at home, as we discussed today, to decrease the amount of carbohydrates and sugar you eat-- keep up the great work reviewing the educational booklet about Diabetes that I mailed to you-- try to make small changes every week for lasting change to take effect Try to check your blood pressure when you are out in the community and have access to a blood pressure monitor-- when you check your blood pressure, try to write it down on paper so you can show to Dr. Quay Burow when you go for your next office visit Keep up the great work preventing falls Keep up the great work at walking outside for exercise-- regular, structured activity and exercise is a great way to help control both your blood sugar and your blood pressure    Plan:  Telephone follow up appointment with care management team member scheduled for: Wednesday, October 28, 2021 at 10:30 am The patient has been provided with contact information for the care management team and has been advised to call with any health related questions or concerns   Oneta Rack, RN, BSN, [place] [phone]: direct office

## 2021-09-13 MED ORDER — LEVOTHYROXINE SODIUM 125 MCG PO TABS: 125.0000 ug | ORAL_TABLET | Freq: Every day | ORAL | 3 refills | Status: DC

## 2021-09-13 NOTE — Addendum Note (Signed)
Addended by: Binnie Rail on: 09/13/2021 03:00 PM   Modules accepted: Orders

## 2021-09-29 ENCOUNTER — Other Ambulatory Visit: Payer: Self-pay | Admitting: Internal Medicine

## 2021-10-28 ENCOUNTER — Ambulatory Visit: Payer: 59 | Admitting: *Deleted

## 2021-10-28 DIAGNOSIS — I10 Essential (primary) hypertension: Secondary | ICD-10-CM

## 2021-10-28 DIAGNOSIS — E1165 Type 2 diabetes mellitus with hyperglycemia: Secondary | ICD-10-CM

## 2021-10-28 NOTE — Chronic Care Management (AMB) (Signed)
Care Management    RN Visit Note  10/28/2021 Name: Sylvia Conner MRN: [id number] DOB: 04-10-1955  Subjective: Sylvia Conner is a 66 y.o. year old female who is a primary care patient of Burns, Claudina Lick, MD. The care management team was consulted for assistance with disease management and care coordination needs.    Engaged with patient by telephone for follow up visit/ RN CM case closure in response to provider referral for case management and/or care coordination services.   Consent to Services:   Sylvia Conner was given information about Care Management services 07/09/21 including:  Care Management services includes personalized support from designated clinical staff supervised by her physician, including individualized plan of care and coordination with other care providers 24/7 contact phone numbers for assistance for urgent and routine care needs. The patient may stop case management services at any time by phone call to the office staff.  Patient agreed to services and consent obtained.   Assessment: Review of patient past medical history, allergies, medications, health status, including review of consultants reports, laboratory and other test data, was performed as part of comprehensive evaluation and provision of chronic care management services.   SDOH (Social Determinants of Health) assessments and interventions performed:  SDOH Interventions    Flowsheet Row Most Recent Value  SDOH Interventions   Food Insecurity Interventions Intervention Not Indicated  [continues to deny food insecurity]  Transportation Interventions Intervention Not Indicated  [continues to drive self]     Care Plan  Allergies  Allergen Reactions   Penicillins Anaphylaxis and Rash    Has patient had a PCN reaction causing immediate rash, facial/tongue/throat swelling, SOB or lightheadedness with hypotension: Yes Has patient had a PCN reaction causing severe rash involving mucus membranes or skin  necrosis: No Has patient had a PCN reaction that required hospitalization Yes Has patient had a PCN reaction occurring within the last 10 years: No If all of the above answers are "NO", then may proceed with Cephalosporin use.    Glipizide     Breakout on arms   Januvia [Sitagliptin] Nausea Only   Sulfa Antibiotics Swelling   Scallops [Shellfish Allergy] Hives    Outpatient Encounter Medications as of 10/28/2021  Medication Sig   blood glucose meter kit and supplies KIT Dispense based on patient and insurance preference. Use up to four times daily as directed. (FOR E11.9).   levothyroxine (SYNTHROID) 125 MCG tablet Take 1 tablet (125 mcg total) by mouth daily. Take 6 days a week   meloxicam (MOBIC) 15 MG tablet Take 1 tablet (15 mg total) by mouth daily. (Patient taking differently: Take 15 mg by mouth daily as needed.)   metFORMIN (GLUCOPHAGE) 1000 MG tablet TAKE 1 TABLET BY MOUTH TWICE DAILY WITH A MEAL   Multiple Vitamins-Minerals (HAIR SKIN AND NAILS FORMULA PO) Take 1 tablet by mouth daily.   pioglitazone (ACTOS) 30 MG tablet Take 1 tablet (30 mg total) by mouth daily.   potassium chloride SA (KLOR-CON) 20 MEQ tablet Take 2 tablets (40 mEq total) by mouth daily.   rosuvastatin (CRESTOR) 10 MG tablet Take 1 tablet (10 mg total) by mouth daily.   Semaglutide, 1 MG/DOSE, (OZEMPIC, 1 MG/DOSE,) 2 MG/1.5ML SOPN Inject 1 mg into the skin once a week. Dx DM 2   traMADol (ULTRAM) 50 MG tablet Take 1 tablet (50 mg total) by mouth every 6 (six) hours as needed for moderate pain or severe pain.   triamcinolone cream (KENALOG) 0.5 % Apply topically 2 (two)  times daily as needed.   triamterene-hydrochlorothiazide (MAXZIDE) 75-50 MG tablet TAKE 1 TABLET BY MOUTH ONCE DAILY IN THE MORNING   No facility-administered encounter medications on file as of 10/28/2021.   Patient Active Problem List   Diagnosis Date Noted   Skin cyst 04/10/2021   PND (post-nasal drip) 03/02/2021   Breast pain, right  08/18/2020   Arthralgia 06/03/2020   Right-sided chest pain 06/03/2020   Constipation 12/27/2019   Pruritus 08/08/2019   Episcleritis of left eye 06/21/2019   Hyperlipidemia 11/30/2018   Lipoma of arm 05/03/2017   Eczema, feet 12/24/2016   Left lower quadrant pain 05/04/2016   Right shoulder pain 10/31/2015   Adnexal mass 10/31/2015   HTN (hypertension) 05/24/2011   DM (diabetes mellitus) ([place]) 05/24/2011   Hypothyroidism 05/24/2011   History of hysterectomy 05/24/2011   Conditions to be addressed/monitored: HTN and DMII  Care Plan : RN Care Manager Plan of Care  Updates made by Knox Royalty, RN since 10/28/2021 12:00 AM     Problem: Chronic Disease Management Needs   Priority: High     Long-Range Goal: Ongoing adherence to established plan of care for long term chronic disease management   Start Date: 07/13/2021  Expected End Date: 07/14/2022  Priority: High  Note:   Current Barriers:  Chronic Disease Management support and education needs related to DMII  RNCM Clinical Goal(s):  Patient will demonstrate ongoing health management independence as evidenced by adherence to plan of care for self-management of DMII        through collaboration with RN Care manager, provider, and care team  Interventions: 1:1 collaboration with primary care provider regarding development and update of comprehensive plan of care as evidenced by provider attestation and co-signature Inter-disciplinary care team collaboration (see longitudinal plan of care) RN CM Initial assessment completed 07/13/21 Evaluation of current treatment plan related to  self management and patient's adherence to plan as established by provider Review of patient status, including review of consultants reports, relevant laboratory and other test results, and medications completed SDOH updated: no new/ unmet concerns identified Pain assessment updated: denies pain Falls assessment updated: continues to deny new/ recent  falls x 12 months- not currently using assistive devices;  positive reinforcement provided with encouragement to continue efforts at fall prevention; previously provided education around fall risks/ prevention reinforced Medications discussed: reports continues to independently self-manage and denies current concerns/ issues/ questions around medications; endorses adherence to taking all medications as prescribed Reviewed upcoming scheduled provider appointments: 01/12/22- PCP; patient confirms is aware of all and has plans to attend as scheduled Discussed plans with patient for ongoing care management follow up- patient denies current care coordination/ care management needs and is agreeable to RN CM case closure today; she states she believes she has "learned a lot" about self-management of DMII and feels "on track;" verbalizes understanding to contact PCP or other care providers for any needs that arise in the future; confirms she has contact information for all care providers     Diabetes/ HTN:  (Status: 10/28/21: Goal Met.) Long Term Goal   Lab Results  Component Value Date   HGBA1C 8.8 (H) 09/09/2021    Provided education to patient about basic DM disease process; Reviewed patient's recent fasting blood sugars; reports general ranges consistently between 179-225; reinforced previously provided education around general goals for fasting blood sugars-- between 120-130; she reports fasting blood sugar this morning of 179-- reports consistent fasting blood sugars at "about 200;" confirmed she has continued  monitoring blood sugars fasting QD- has not yet started to alternate checking post-prandial Confirmed with patient no low blood sugar values at home- reinforced general signs/ symptoms of same along with corresponding action plan Reinforced previously provided education/ strategies for simple dietary changes she could consider; she has continued not drinking soda's and reading nutritional labels;  positive reinforcement provided with encouragement to continue efforts Confirmed patient continues not routinely/ regularly monitoring blood pressures at home; continues occasionally monitoring when she goes to outpatient pharmacy/ stores with automatic cuffs-- this was encouraged Reports has continued started walking outside for exercise  "about every day;" reinforced overall benefit of regular structured exercise in setting of both DMII/ HTN: positive reinforcement provided with encouragement to continue efforts      Plan:  No further follow up required: patient denies current care coordination/ care management needs and is agreeable to RN CM case closure today- goals around self-health management of DMII have been met; RN CM case closure accordingly     Oneta Rack, RN, BSN, [place] [phone]: direct office

## 2021-10-30 ENCOUNTER — Ambulatory Visit: Payer: 59 | Admitting: Internal Medicine

## 2021-11-17 ENCOUNTER — Other Ambulatory Visit: Payer: Self-pay

## 2021-11-17 ENCOUNTER — Telehealth: Payer: Self-pay

## 2021-11-17 MED ORDER — TRIAMTERENE-HCTZ 75-50 MG PO TABS
1.0000 | ORAL_TABLET | Freq: Every morning | ORAL | 0 refills | Status: DC
Start: 2021-11-17 — End: 2022-02-08

## 2021-11-17 NOTE — Telephone Encounter (Signed)
Pt is requesting a short fill on her BP medication triamterene-hydrochlorothiazide (MAXZIDE) 75-50 MG tablet.  Pt is out of town a [place] longer than expected.  Pharmacy: [facility] [address] [place] 09/09/21 ROV 01/12/22

## 2021-11-17 NOTE — Telephone Encounter (Signed)
Refill sent in today

## 2021-12-01 ENCOUNTER — Ambulatory Visit (INDEPENDENT_AMBULATORY_CARE_PROVIDER_SITE_OTHER): Payer: 59 | Admitting: Internal Medicine

## 2021-12-01 ENCOUNTER — Encounter: Payer: Self-pay | Admitting: Internal Medicine

## 2021-12-01 VITALS — BP 120/80 | HR 86 | Temp 98.3°F | Ht 62.5 in | Wt 144.0 lb

## 2021-12-01 DIAGNOSIS — Z794 Long term (current) use of insulin: Secondary | ICD-10-CM

## 2021-12-01 DIAGNOSIS — R3915 Urgency of urination: Secondary | ICD-10-CM | POA: Diagnosis not present

## 2021-12-01 DIAGNOSIS — R109 Unspecified abdominal pain: Secondary | ICD-10-CM | POA: Diagnosis not present

## 2021-12-01 DIAGNOSIS — E039 Hypothyroidism, unspecified: Secondary | ICD-10-CM

## 2021-12-01 DIAGNOSIS — R1032 Left lower quadrant pain: Secondary | ICD-10-CM

## 2021-12-01 DIAGNOSIS — E1165 Type 2 diabetes mellitus with hyperglycemia: Secondary | ICD-10-CM

## 2021-12-01 LAB — CBC WITH DIFFERENTIAL/PLATELET
Basophils Absolute: 0.1 10*3/uL (ref 0.0–0.1)
Basophils Relative: 1.2 % (ref 0.0–3.0)
Eosinophils Absolute: 0.1 10*3/uL (ref 0.0–0.7)
Eosinophils Relative: 2.3 % (ref 0.0–5.0)
HCT: 35.6 % — ABNORMAL LOW (ref 36.0–46.0)
Hemoglobin: 11.3 g/dL — ABNORMAL LOW (ref 12.0–15.0)
Lymphocytes Relative: 37.7 % (ref 12.0–46.0)
Lymphs Abs: 1.7 10*3/uL (ref 0.7–4.0)
MCHC: 31.6 g/dL (ref 30.0–36.0)
MCV: 75.2 fl — ABNORMAL LOW (ref 78.0–100.0)
Monocytes Absolute: 0.2 10*3/uL (ref 0.1–1.0)
Monocytes Relative: 5.2 % (ref 3.0–12.0)
Neutro Abs: 2.5 10*3/uL (ref 1.4–7.7)
Neutrophils Relative %: 53.6 % (ref 43.0–77.0)
Platelets: 294 10*3/uL (ref 150.0–400.0)
RBC: 4.74 Mil/uL (ref 3.87–5.11)
RDW: 14.5 % (ref 11.5–15.5)
WBC: 4.6 10*3/uL (ref 4.0–10.5)

## 2021-12-01 LAB — URINALYSIS, ROUTINE W REFLEX MICROSCOPIC
Bilirubin Urine: NEGATIVE
Hgb urine dipstick: NEGATIVE
Ketones, ur: NEGATIVE
Nitrite: NEGATIVE
RBC / HPF: NONE SEEN (ref 0–?)
Specific Gravity, Urine: 1.015 (ref 1.000–1.030)
Total Protein, Urine: NEGATIVE
Urine Glucose: 250 — AB
Urobilinogen, UA: 0.2 (ref 0.0–1.0)
pH: 8 (ref 5.0–8.0)

## 2021-12-01 LAB — COMPREHENSIVE METABOLIC PANEL
ALT: 21 U/L (ref 0–35)
AST: 31 U/L (ref 0–37)
Albumin: 4.2 g/dL (ref 3.5–5.2)
Alkaline Phosphatase: 102 U/L (ref 39–117)
BUN: 13 mg/dL (ref 6–23)
CO2: 30 mEq/L (ref 19–32)
Calcium: 10 mg/dL (ref 8.4–10.5)
Chloride: 96 mEq/L (ref 96–112)
Creatinine, Ser: 0.91 mg/dL (ref 0.40–1.20)
GFR: 66.56 mL/min (ref >60.00)
Glucose, Bld: 224 mg/dL — ABNORMAL HIGH (ref 70–99)
Potassium: 3.3 mEq/L — ABNORMAL LOW (ref 3.5–5.1)
Sodium: 137 mEq/L (ref 135–145)
Total Bilirubin: 0.7 mg/dL (ref 0.2–1.2)
Total Protein: 8 g/dL (ref 6.0–8.3)

## 2021-12-01 LAB — TSH: TSH: 1.2 u[IU]/mL (ref 0.35–5.50)

## 2021-12-01 LAB — HEMOGLOBIN A1C: Hgb A1c MFr Bld: 9.1 % — ABNORMAL HIGH (ref 4.6–6.5)

## 2021-12-01 MED ORDER — PIOGLITAZONE HCL 30 MG PO TABS: 30.0000 mg | ORAL_TABLET | Freq: Every day | ORAL | 5 refills | Status: DC

## 2021-12-01 NOTE — Assessment & Plan Note (Signed)
Has had some mild urinary urgency Since she is having some left-sided abdominal bloating sensation and abdominal bloating will check UA, urine culture to rule out a UTI

## 2021-12-01 NOTE — Progress Notes (Signed)
Subjective:    Patient ID: Sylvia Conner, female    DOB: 08/26/1955, 66 y.o.   MRN: [id number]      HPI Grazia is here for  Chief Complaint  Patient presents with   Flank Pain    Left sided flank pain; Patient noted extreme bloating last week    Last week she was bloating / swelling, but that has resolved. She has had this intermittently x 1 month. She also has burning sensation in left mid to lower abdomen.  When it starts it can last all day.    Lowest sugar 198- 200's.  She does not want to lose anymore weight.   Medications and allergies reviewed with patient and updated if appropriate.  Current Outpatient Medications on File Prior to Visit  Medication Sig Dispense Refill   blood glucose meter kit and supplies KIT Dispense based on patient and insurance preference. Use up to four times daily as directed. (FOR E11.9). 1 each 0   levothyroxine (SYNTHROID) 125 MCG tablet Take 1 tablet (125 mcg total) by mouth daily. Take 6 days a week 86 tablet 3   meloxicam (MOBIC) 15 MG tablet Take 1 tablet (15 mg total) by mouth daily. (Patient taking differently: Take 15 mg by mouth daily as needed.) 20 tablet 0   metFORMIN (GLUCOPHAGE) 1000 MG tablet TAKE 1 TABLET BY MOUTH TWICE DAILY WITH A MEAL 180 tablet 0   Multiple Vitamins-Minerals (HAIR SKIN AND NAILS FORMULA PO) Take 1 tablet by mouth daily.     potassium chloride SA (KLOR-CON) 20 MEQ tablet Take 2 tablets (40 mEq total) by mouth daily. 180 tablet 1   rosuvastatin (CRESTOR) 10 MG tablet Take 1 tablet (10 mg total) by mouth daily. 90 tablet 3   Semaglutide, 1 MG/DOSE, (OZEMPIC, 1 MG/DOSE,) 2 MG/1.5ML SOPN Inject 1 mg into the skin once a week. Dx DM 2 9 mL 3   traMADol (ULTRAM) 50 MG tablet Take 1 tablet (50 mg total) by mouth every 6 (six) hours as needed for moderate pain or severe pain. 20 tablet 0   triamcinolone cream (KENALOG) 0.5 % Apply topically 2 (two) times daily as needed. 30 g 1   triamterene-hydrochlorothiazide  (MAXZIDE) 75-50 MG tablet Take 1 tablet by mouth every morning. 30 tablet 0   No current facility-administered medications on file prior to visit.    Review of Systems  Constitutional:  Negative for fever.  Gastrointestinal:  Negative for abdominal pain (burning sensation in left side of abdomen), blood in stool (no melena), constipation, diarrhea and nausea.       No gerd  Genitourinary:  Positive for urgency (sometimes). Negative for difficulty urinating, dysuria, frequency and hematuria.  Musculoskeletal:  Negative for back pain.       Objective:   Vitals:   12/01/21 1025  BP: 120/80  Pulse: 86  Temp: 98.3 F (36.8 C)  SpO2: 99%   BP Readings from Last 3 Encounters:  12/01/21 120/80  09/09/21 134/82  07/28/21 (!) 141/76   Wt Readings from Last 3 Encounters:  12/01/21 144 lb (65.3 kg)  09/09/21 149 lb (67.6 kg)  07/28/21 156 lb 4.9 oz (70.9 kg)   Body mass index is 25.92 kg/m.    Physical Exam Constitutional:      General: She is not in acute distress.    Appearance: Normal appearance.  HENT:     Head: Normocephalic and atraumatic.  Eyes:     Conjunctiva/sclera: Conjunctivae normal.  Cardiovascular:  Rate and Rhythm: Normal rate and regular rhythm.     Heart sounds: Normal heart sounds. No murmur heard. Pulmonary:     Effort: Pulmonary effort is normal. No respiratory distress.     Breath sounds: Normal breath sounds. No wheezing.  Abdominal:     General: There is no distension.     Palpations: Abdomen is soft. There is no mass.     Tenderness: There is no abdominal tenderness. There is no right CVA tenderness or left CVA tenderness.     Hernia: No hernia is present.  Musculoskeletal:     Right lower leg: No edema.     Left lower leg: No edema.  Skin:    General: Skin is warm and dry.     Findings: No rash.  Neurological:     Mental Status: She is alert. Mental status is at baseline.  Psychiatric:        Mood and Affect: Mood normal.         Behavior: Behavior normal.            Assessment & Plan:    See Problem List for Assessment and Plan of chronic medical problems.

## 2021-12-01 NOTE — Assessment & Plan Note (Signed)
New This past months she has had intermittent left mid quadrant-left lower quadrant burning sensation and lower abdominal swelling She denies any real pain. She does have some urinary urgency, but denies any other urine symptoms.  No changes in bowels History of ovarian cyst We will check BC, CMP, UA, urine culture This has been going on for months and she does have a history of an ovarian cyst will check CT abdomen pelvis with contrast

## 2021-12-01 NOTE — Patient Instructions (Addendum)
     Blood work was ordered.     Medications changes include :   restart Actos 30 mg daily   Your prescription(s) have been sent to your pharmacy.    A Ct of your abdomen/pelvis was ordered.     Someone will call you to schedule an appointment.    Return if symptoms worsen or fail to improve.

## 2021-12-01 NOTE — Assessment & Plan Note (Signed)
Chronic She has been losing weight most likely related to Ozempic and has had a need for less levothyroxine Last TSH was on the low side Recheck TSH today and will adjust medication

## 2021-12-01 NOTE — Assessment & Plan Note (Signed)
Chronic Sugars not ideally controlled We will check A1c today We will continue metformin 1000 mg twice daily and Ozempic 1 mg weekly Has not been taking the Actos-restart Actos 30 mg daily We may need to adjust medication further-her weight is good and she does not want to lose further weight-May need to decrease Ozempic

## 2021-12-02 ENCOUNTER — Other Ambulatory Visit: Payer: Self-pay | Admitting: Internal Medicine

## 2021-12-02 MED ORDER — POTASSIUM CHLORIDE CRYS ER 20 MEQ PO TBCR
60.0000 meq | EXTENDED_RELEASE_TABLET | Freq: Every day | ORAL | 1 refills | Status: DC
Start: 2021-12-02 — End: 2024-01-09

## 2021-12-03 LAB — URINE CULTURE: Result:: NO GROWTH

## 2021-12-07 ENCOUNTER — Other Ambulatory Visit: Payer: Self-pay | Admitting: Internal Medicine

## 2021-12-11 ENCOUNTER — Ambulatory Visit: Payer: 59 | Admitting: Family Medicine

## 2021-12-13 ENCOUNTER — Encounter: Payer: Self-pay | Admitting: Internal Medicine

## 2021-12-13 NOTE — Progress Notes (Deleted)
    Subjective:    Patient ID: Sylvia Conner, female    DOB: [birth year], 66 y.o.   MRN: [id number]      HPI Sylvia Conner is here for No chief complaint on file.   Left flank pain- mid to lower abdomen burning sensation - intermittent x 1 month.    Ua, ucx, blood counts, kidney and liver tests normal.  Ct scheduled for 9/21   Medications and allergies reviewed with patient and updated if appropriate.  Current Outpatient Medications on File Prior to Visit  Medication Sig Dispense Refill   blood glucose meter kit and supplies KIT Dispense based on patient and insurance preference. Use up to four times daily as directed. (FOR E11.9). 1 each 0   levothyroxine (SYNTHROID) 125 MCG tablet Take 1 tablet (125 mcg total) by mouth daily. Take 6 days a week 86 tablet 3   meloxicam (MOBIC) 15 MG tablet Take 1 tablet (15 mg total) by mouth daily. (Patient taking differently: Take 15 mg by mouth daily as needed.) 20 tablet 0   metFORMIN (GLUCOPHAGE) 1000 MG tablet TAKE 1 TABLET BY MOUTH TWICE DAILY WITH A MEAL 180 tablet 0   Multiple Vitamins-Minerals (HAIR SKIN AND NAILS FORMULA PO) Take 1 tablet by mouth daily.     pioglitazone (ACTOS) 30 MG tablet Take 1 tablet (30 mg total) by mouth daily. 30 tablet 5   potassium chloride SA (KLOR-CON M) 20 MEQ tablet Take 3 tablets (60 mEq total) by mouth daily. 270 tablet 1   rosuvastatin (CRESTOR) 10 MG tablet Take 1 tablet (10 mg total) by mouth daily. 90 tablet 3   Semaglutide, 1 MG/DOSE, (OZEMPIC, 1 MG/DOSE,) 2 MG/1.5ML SOPN Inject 1 mg into the skin once a week. Dx DM 2 9 mL 3   traMADol (ULTRAM) 50 MG tablet Take 1 tablet (50 mg total) by mouth every 6 (six) hours as needed for moderate pain or severe pain. 20 tablet 0   triamcinolone cream (KENALOG) 0.5 % Apply topically 2 (two) times daily as needed. 30 g 1   triamterene-hydrochlorothiazide (MAXZIDE) 75-50 MG tablet Take 1 tablet by mouth every morning. 30 tablet 0   No current facility-administered  medications on file prior to visit.    Review of Systems     Objective:  There were no vitals filed for this visit. BP Readings from Last 3 Encounters:  12/01/21 120/80  09/09/21 134/82  07/28/21 (!) 141/76   Wt Readings from Last 3 Encounters:  12/01/21 144 lb (65.3 kg)  09/09/21 149 lb (67.6 kg)  07/28/21 156 lb 4.9 oz (70.9 kg)   There is no height or weight on file to calculate BMI.    Physical Exam         Assessment & Plan:    See Problem List for Assessment and Plan of chronic medical problems.

## 2021-12-15 ENCOUNTER — Ambulatory Visit: Payer: 59 | Admitting: Internal Medicine

## 2021-12-15 DIAGNOSIS — R109 Unspecified abdominal pain: Secondary | ICD-10-CM

## 2021-12-24 ENCOUNTER — Ambulatory Visit
Admission: RE | Admit: 2021-12-24 | Discharge: 2021-12-24 | Disposition: A | Discharge status: Home / Self Care | Payer: 59 | Source: Ambulatory Visit | Attending: Internal Medicine | Admitting: Internal Medicine

## 2021-12-24 DIAGNOSIS — R109 Unspecified abdominal pain: Secondary | ICD-10-CM

## 2021-12-24 DIAGNOSIS — R1032 Left lower quadrant pain: Secondary | ICD-10-CM

## 2021-12-24 MED ORDER — IOPAMIDOL (ISOVUE-300) INJECTION 61%
100.0000 mL | Freq: Once | INTRAVENOUS | Status: AC | PRN
  Administered 2021-12-24: 100 mL via INTRAVENOUS

## 2021-12-29 ENCOUNTER — Telehealth: Payer: Self-pay | Admitting: Internal Medicine

## 2021-12-29 NOTE — Telephone Encounter (Signed)
Patients insurance will no longer pay for [place] called with other options  1)  Bydureon bcise  2)  Byetta  3)Trulicity  Please call pharmacy and let them know which one you want to prescribe.

## 2021-12-30 ENCOUNTER — Ambulatory Visit: Payer: 59 | Admitting: Internal Medicine

## 2021-12-30 MED ORDER — TRULICITY 0.75 MG/0.5ML ~~LOC~~ SOAJ: 0.7500 mg | SUBCUTANEOUS | 0 refills | Status: DC

## 2021-12-30 NOTE — Telephone Encounter (Signed)
We will try Trulicity again.  Need to start at the lowest dose-after 1 month we can increase and she needs to let me know so I can send in the higher dose.

## 2022-01-12 ENCOUNTER — Ambulatory Visit: Payer: 59 | Admitting: Internal Medicine

## 2022-01-25 ENCOUNTER — Other Ambulatory Visit: Payer: Self-pay | Admitting: Internal Medicine

## 2022-01-26 ENCOUNTER — Encounter: Payer: Self-pay | Admitting: Internal Medicine

## 2022-01-26 NOTE — Progress Notes (Unsigned)
Subjective:    Patient ID: Sylvia Conner, female    DOB: 14-Mar-1956, 66 y.o.   MRN: [id number]     HPI Sylvia Conner is here for follow up of her chronic medical problems, including DM, htn, hypothyroid, hld  Walking for exercise. She joined a gym   Sugars  - 100's - mid - high 100's  Medications and allergies reviewed with patient and updated if appropriate.  Current Outpatient Medications on File Prior to Visit  Medication Sig Dispense Refill   blood glucose meter kit and supplies KIT Dispense based on patient and insurance preference. Use up to four times daily as directed. (FOR E11.9). 1 each 0   Dulaglutide (TRULICITY) 7.85 YI/5.0YD SOPN Inject 0.75 mg into the skin once a week. 2 mL 0   levothyroxine (SYNTHROID) 125 MCG tablet Take 1 tablet (125 mcg total) by mouth daily. Take 6 days a week 86 tablet 3   meloxicam (MOBIC) 15 MG tablet Take 1 tablet (15 mg total) by mouth daily. (Patient taking differently: Take 15 mg by mouth daily as needed.) 20 tablet 0   metFORMIN (GLUCOPHAGE) 1000 MG tablet TAKE 1 TABLET BY MOUTH TWICE DAILY WITH A MEAL 180 tablet 0   Multiple Vitamins-Minerals (HAIR SKIN AND NAILS FORMULA PO) Take 1 tablet by mouth daily.     pioglitazone (ACTOS) 30 MG tablet Take 1 tablet (30 mg total) by mouth daily. 30 tablet 5   potassium chloride SA (KLOR-CON M) 20 MEQ tablet Take 3 tablets (60 mEq total) by mouth daily. 270 tablet 1   triamcinolone cream (KENALOG) 0.5 % Apply topically 2 (two) times daily as needed. 30 g 1   triamterene-hydrochlorothiazide (MAXZIDE) 75-50 MG tablet Take 1 tablet by mouth every morning. 30 tablet 0   No current facility-administered medications on file prior to visit.     Review of Systems  Constitutional:  Negative for chills and fever.  Respiratory:  Negative for cough, shortness of breath and wheezing.   Cardiovascular:  Negative for chest pain, palpitations and leg swelling.  Neurological:  Negative for light-headedness and  headaches.       Objective:   Vitals:   01/27/22 1341  BP: 124/76  Pulse: 80  Temp: 98.6 F (37 C)  SpO2: 97%   BP Readings from Last 3 Encounters:  01/27/22 124/76  12/01/21 120/80  09/09/21 134/82   Wt Readings from Last 3 Encounters:  01/27/22 147 lb (66.7 kg)  12/01/21 144 lb (65.3 kg)  09/09/21 149 lb (67.6 kg)   Body mass index is 26.46 kg/m.    Physical Exam Constitutional:      General: She is not in acute distress.    Appearance: Normal appearance.  HENT:     Head: Normocephalic and atraumatic.  Eyes:     Conjunctiva/sclera: Conjunctivae normal.  Cardiovascular:     Rate and Rhythm: Normal rate and regular rhythm.  Pulmonary:     Effort: Pulmonary effort is normal. No respiratory distress.     Breath sounds: Normal breath sounds. No wheezing.  Musculoskeletal:     Cervical back: Neck supple.     Right lower leg: No edema.     Left lower leg: No edema.  Lymphadenopathy:     Cervical: No cervical adenopathy.  Skin:    General: Skin is warm and dry.     Findings: No rash.  Neurological:     Mental Status: She is alert. Mental status is at baseline.  Psychiatric:  Mood and Affect: Mood normal.        Behavior: Behavior normal.        Lab Results  Component Value Date   WBC 5.5 01/27/2022   HGB 11.8 (L) 01/27/2022   HCT 37.6 01/27/2022   PLT 292.0 01/27/2022   GLUCOSE 173 (H) 01/27/2022   CHOL 259 (H) 01/27/2022   TRIG 121.0 01/27/2022   HDL 90.00 01/27/2022   LDLCALC 145 (H) 01/27/2022   ALT 31 01/27/2022   AST 31 01/27/2022   NA 135 01/27/2022   K 3.6 01/27/2022   CL 98 01/27/2022   CREATININE 0.89 01/27/2022   BUN 13 01/27/2022   CO2 31 01/27/2022   TSH 9.52 (H) 01/27/2022   HGBA1C 7.9 (H) 01/27/2022   MICROALBUR <0.7 01/27/2022     Assessment & Plan:    See Problem List for Assessment and Plan of chronic medical problems.

## 2022-01-26 NOTE — Patient Instructions (Addendum)
      Blood work was ordered.   The lab is on the first floor.    Medications changes include :   increase crestor to 20 mg daily      Return in about 3 months (around 04/29/2022) for follow up.

## 2022-01-27 ENCOUNTER — Ambulatory Visit (INDEPENDENT_AMBULATORY_CARE_PROVIDER_SITE_OTHER): Payer: Commercial Managed Care - HMO | Admitting: Internal Medicine

## 2022-01-27 VITALS — BP 124/76 | HR 80 | Temp 98.6°F | Ht 62.5 in | Wt 147.0 lb

## 2022-01-27 DIAGNOSIS — I10 Essential (primary) hypertension: Secondary | ICD-10-CM

## 2022-01-27 DIAGNOSIS — E039 Hypothyroidism, unspecified: Secondary | ICD-10-CM

## 2022-01-27 DIAGNOSIS — E1165 Type 2 diabetes mellitus with hyperglycemia: Secondary | ICD-10-CM

## 2022-01-27 DIAGNOSIS — Z794 Long term (current) use of insulin: Secondary | ICD-10-CM

## 2022-01-27 DIAGNOSIS — E782 Mixed hyperlipidemia: Secondary | ICD-10-CM

## 2022-01-27 LAB — COMPREHENSIVE METABOLIC PANEL
ALT: 31 U/L (ref 0–35)
AST: 31 U/L (ref 0–37)
Albumin: 4.4 g/dL (ref 3.5–5.2)
Alkaline Phosphatase: 101 U/L (ref 39–117)
BUN: 13 mg/dL (ref 6–23)
CO2: 31 mEq/L (ref 19–32)
Calcium: 9.8 mg/dL (ref 8.4–10.5)
Chloride: 98 mEq/L (ref 96–112)
Creatinine, Ser: 0.89 mg/dL (ref 0.40–1.20)
GFR: 68.28 mL/min (ref >60.00)
Glucose, Bld: 173 mg/dL — ABNORMAL HIGH (ref 70–99)
Potassium: 3.6 mEq/L (ref 3.5–5.1)
Sodium: 135 mEq/L (ref 135–145)
Total Bilirubin: 0.6 mg/dL (ref 0.2–1.2)
Total Protein: 8.1 g/dL (ref 6.0–8.3)

## 2022-01-27 LAB — CBC WITH DIFFERENTIAL/PLATELET
Basophils Absolute: 0.1 10*3/uL (ref 0.0–0.1)
Basophils Relative: 1.3 % (ref 0.0–3.0)
Eosinophils Absolute: 0.2 10*3/uL (ref 0.0–0.7)
Eosinophils Relative: 2.9 % (ref 0.0–5.0)
HCT: 37.6 % (ref 36.0–46.0)
Hemoglobin: 11.8 g/dL — ABNORMAL LOW (ref 12.0–15.0)
Lymphocytes Relative: 33.4 % (ref 12.0–46.0)
Lymphs Abs: 1.8 10*3/uL (ref 0.7–4.0)
MCHC: 31.2 g/dL (ref 30.0–36.0)
MCV: 76.6 fl — ABNORMAL LOW (ref 78.0–100.0)
Monocytes Absolute: 0.3 10*3/uL (ref 0.1–1.0)
Monocytes Relative: 5.7 % (ref 3.0–12.0)
Neutro Abs: 3.1 10*3/uL (ref 1.4–7.7)
Neutrophils Relative %: 56.7 % (ref 43.0–77.0)
Platelets: 292 10*3/uL (ref 150.0–400.0)
RBC: 4.91 Mil/uL (ref 3.87–5.11)
RDW: 14 % (ref 11.5–15.5)
WBC: 5.5 10*3/uL (ref 4.0–10.5)

## 2022-01-27 LAB — LIPID PANEL
Cholesterol: 259 mg/dL — ABNORMAL HIGH (ref 0–200)
HDL: 90 mg/dL (ref >39.00)
LDL Cholesterol: 145 mg/dL — ABNORMAL HIGH (ref 0–99)
NonHDL: 168.7
Total CHOL/HDL Ratio: 3
Triglycerides: 121 mg/dL (ref 0.0–149.0)
VLDL: 24.2 mg/dL (ref 0.0–40.0)

## 2022-01-27 LAB — TSH: TSH: 9.52 u[IU]/mL — ABNORMAL HIGH (ref 0.35–5.50)

## 2022-01-27 LAB — MICROALBUMIN / CREATININE URINE RATIO
Creatinine,U: 84.8 mg/dL
Microalb Creat Ratio: 0.8 mg/g (ref 0.0–30.0)
Microalb, Ur: <0.7 mg/dL (ref 0.0–1.9)

## 2022-01-27 LAB — HEMOGLOBIN A1C: Hgb A1c MFr Bld: 7.9 % — ABNORMAL HIGH (ref 4.6–6.5)

## 2022-01-27 MED ORDER — LEVOCETIRIZINE DIHYDROCHLORIDE 5 MG PO TABS: 5.0000 mg | ORAL_TABLET | Freq: Every evening | ORAL | 5 refills | Status: DC

## 2022-01-27 MED ORDER — ROSUVASTATIN CALCIUM 20 MG PO TABS: 20.0000 mg | ORAL_TABLET | Freq: Every day | ORAL | 3 refills | Status: DC

## 2022-01-27 NOTE — Assessment & Plan Note (Signed)
Chronic Regular exercise and healthy diet encouraged Check lipid panel  Continue rosuvastatin, but increase to 20 mg daily

## 2022-01-27 NOTE — Assessment & Plan Note (Signed)
Chronic Blood pressure well controlled CMP Continue triamterene-hydrochlorothiazide 75-50 mg daily

## 2022-01-27 NOTE — Assessment & Plan Note (Signed)
Chronic  Lab Results  Component Value Date   HGBA1C 7.9 (H) 01/27/2022   Sugars not ideally controlled Testing sugars one times a day Check J4W Continue Trulicity 9.92 mg weekly, metformin 1000 mg twice daily, Actos 30 mg daily Stressed regular exercise, diabetic diet

## 2022-01-27 NOTE — Assessment & Plan Note (Signed)
Chronic  Clinically euthyroid Check tsh and will titrate med dose if needed Currently taking levothyroxine 125 mcg daily 6 days a week

## 2022-01-28 ENCOUNTER — Telehealth: Payer: Self-pay | Admitting: Internal Medicine

## 2022-01-28 NOTE — Telephone Encounter (Signed)
Form completed and put in folder to be signed.

## 2022-01-28 NOTE — Telephone Encounter (Signed)
PT visits with Patient Assistance Application forms to be filled out by Dr.Burns. The forms have been left in Dr.Burns mailbox.   CB if needed: [phone]

## 2022-01-28 NOTE — Telephone Encounter (Signed)
Form retrieved from box today

## 2022-01-30 ENCOUNTER — Other Ambulatory Visit: Payer: Self-pay | Admitting: Internal Medicine

## 2022-01-30 MED ORDER — LEVOTHYROXINE SODIUM 125 MCG PO TABS: 125.0000 ug | ORAL_TABLET | Freq: Every day | ORAL | 3 refills | Status: DC

## 2022-02-08 ENCOUNTER — Other Ambulatory Visit: Payer: Self-pay | Admitting: Internal Medicine

## 2022-02-24 ENCOUNTER — Telehealth: Payer: Self-pay | Admitting: Internal Medicine

## 2022-02-24 MED ORDER — ONDANSETRON HCL 4 MG PO TABS: 4.0000 mg | ORAL_TABLET | Freq: Three times a day (TID) | ORAL | 0 refills | Status: DC | PRN

## 2022-02-24 NOTE — Telephone Encounter (Signed)
Patient is getting nauseous every time she takes her Trulicity - patient would like for you to call her.

## 2022-02-24 NOTE — Telephone Encounter (Signed)
I sent some Zofran-an antinausea medication for her to take when she takes the injections.  She can try that and see how it works.  The only other option is stopping the medication.

## 2022-02-24 NOTE — Telephone Encounter (Signed)
Spoke with patient today. 

## 2022-02-28 ENCOUNTER — Other Ambulatory Visit: Payer: Self-pay | Admitting: Internal Medicine

## 2022-03-04 ENCOUNTER — Other Ambulatory Visit: Payer: Self-pay | Admitting: Internal Medicine

## 2022-04-28 ENCOUNTER — Encounter: Payer: Self-pay | Admitting: Internal Medicine

## 2022-04-28 NOTE — Patient Instructions (Addendum)
      Blood work was ordered.   The lab is on the first floor.    Medications changes include :       A referral was ordered for XXX.     Someone will call you to schedule an appointment.    Return in about 3 months (around 07/29/2022) for follow up.

## 2022-04-28 NOTE — Progress Notes (Signed)
Subjective:    Patient ID: Sylvia Conner, female    DOB: [birth year], 67 y.o.   MRN: [id number]     HPI Leeana is here for follow up of her chronic medical problems, including htn, DM, hld, hypothyroid    Medications and allergies reviewed with patient and updated if appropriate.  Current Outpatient Medications on File Prior to Visit  Medication Sig Dispense Refill  . blood glucose meter kit and supplies KIT Dispense based on patient and insurance preference. Use up to four times daily as directed. (FOR E11.9). 1 each 0  . levocetirizine (XYZAL) 5 MG tablet Take 1 tablet (5 mg total) by mouth every evening. 30 tablet 5  . levothyroxine (SYNTHROID) 125 MCG tablet Take 1 tablet (125 mcg total) by mouth daily. Take 6 days a week.  Take 1/2 tab once a week 90 tablet 3  . meloxicam (MOBIC) 15 MG tablet Take 1 tablet (15 mg total) by mouth daily. (Patient taking differently: Take 15 mg by mouth daily as needed.) 20 tablet 0  . metFORMIN (GLUCOPHAGE) 1000 MG tablet TAKE 1 TABLET BY MOUTH TWICE DAILY WITH A MEAL 180 tablet 0  . Multiple Vitamins-Minerals (HAIR SKIN AND NAILS FORMULA PO) Take 1 tablet by mouth daily.    . ondansetron (ZOFRAN) 4 MG tablet Take 1 tablet (4 mg total) by mouth every 8 (eight) hours as needed for nausea or vomiting. 20 tablet 0  . pioglitazone (ACTOS) 30 MG tablet Take 1 tablet (30 mg total) by mouth daily. 30 tablet 5  . potassium chloride SA (KLOR-CON M) 20 MEQ tablet Take 3 tablets (60 mEq total) by mouth daily. 270 tablet 1  . rosuvastatin (CRESTOR) 20 MG tablet Take 1 tablet (20 mg total) by mouth daily. 90 tablet 3  . triamcinolone cream (KENALOG) 0.5 % Apply topically 2 (two) times daily as needed. 30 g 1  . triamterene-hydrochlorothiazide (MAXZIDE) 75-50 MG tablet TAKE 1 TABLET BY MOUTH ONCE DAILY IN THE MORNING 90 tablet 0  . TRULICITY 2.42 AS/3.4HD SOPN INJECT 0.'75MG'$  INTO THE SKIN ONCE A WEEK 4 mL 0   No current facility-administered medications on  file prior to visit.     Review of Systems     Objective:  There were no vitals filed for this visit. BP Readings from Last 3 Encounters:  01/27/22 124/76  12/01/21 120/80  09/09/21 134/82   Wt Readings from Last 3 Encounters:  01/27/22 147 lb (66.7 kg)  12/01/21 144 lb (65.3 kg)  09/09/21 149 lb (67.6 kg)   There is no height or weight on file to calculate BMI.    Physical Exam     Lab Results  Component Value Date   WBC 5.5 01/27/2022   HGB 11.8 (L) 01/27/2022   HCT 37.6 01/27/2022   PLT 292.0 01/27/2022   GLUCOSE 173 (H) 01/27/2022   CHOL 259 (H) 01/27/2022   TRIG 121.0 01/27/2022   HDL 90.00 01/27/2022   LDLCALC 145 (H) 01/27/2022   ALT 31 01/27/2022   AST 31 01/27/2022   NA 135 01/27/2022   K 3.6 01/27/2022   CL 98 01/27/2022   CREATININE 0.89 01/27/2022   BUN 13 01/27/2022   CO2 31 01/27/2022   TSH 9.52 (H) 01/27/2022   HGBA1C 7.9 (H) 01/27/2022   MICROALBUR <0.7 01/27/2022     Assessment & Plan:    See Problem List for Assessment and Plan of chronic medical problems.    This encounter was created in  error - please disregard.

## 2022-04-29 ENCOUNTER — Encounter: Payer: Commercial Managed Care - HMO | Admitting: Internal Medicine

## 2022-04-29 DIAGNOSIS — I10 Essential (primary) hypertension: Secondary | ICD-10-CM

## 2022-04-29 DIAGNOSIS — E1165 Type 2 diabetes mellitus with hyperglycemia: Secondary | ICD-10-CM

## 2022-04-29 DIAGNOSIS — E039 Hypothyroidism, unspecified: Secondary | ICD-10-CM

## 2022-04-29 DIAGNOSIS — E782 Mixed hyperlipidemia: Secondary | ICD-10-CM

## 2022-06-04 ENCOUNTER — Other Ambulatory Visit: Payer: Self-pay

## 2022-06-04 ENCOUNTER — Other Ambulatory Visit: Payer: Self-pay | Admitting: Internal Medicine

## 2022-08-07 ENCOUNTER — Other Ambulatory Visit: Payer: Self-pay | Admitting: Internal Medicine

## 2022-08-12 ENCOUNTER — Other Ambulatory Visit: Payer: Self-pay | Admitting: Internal Medicine

## 2022-08-13 ENCOUNTER — Other Ambulatory Visit: Payer: Self-pay | Admitting: Internal Medicine

## 2022-08-24 ENCOUNTER — Ambulatory Visit: Payer: Commercial Managed Care - HMO | Admitting: Internal Medicine

## 2022-08-29 ENCOUNTER — Other Ambulatory Visit: Payer: Self-pay | Admitting: Internal Medicine

## 2022-08-31 ENCOUNTER — Ambulatory Visit: Payer: Commercial Managed Care - HMO | Admitting: Internal Medicine

## 2022-10-14 IMAGING — DX DG CHEST 2V
2 series · 2 of 2 positions shown · non-contrast
Comparison: 06/03/2020

CLINICAL DATA: Chest pain

EXAM:
CHEST - 2 VIEW

[chest pa]
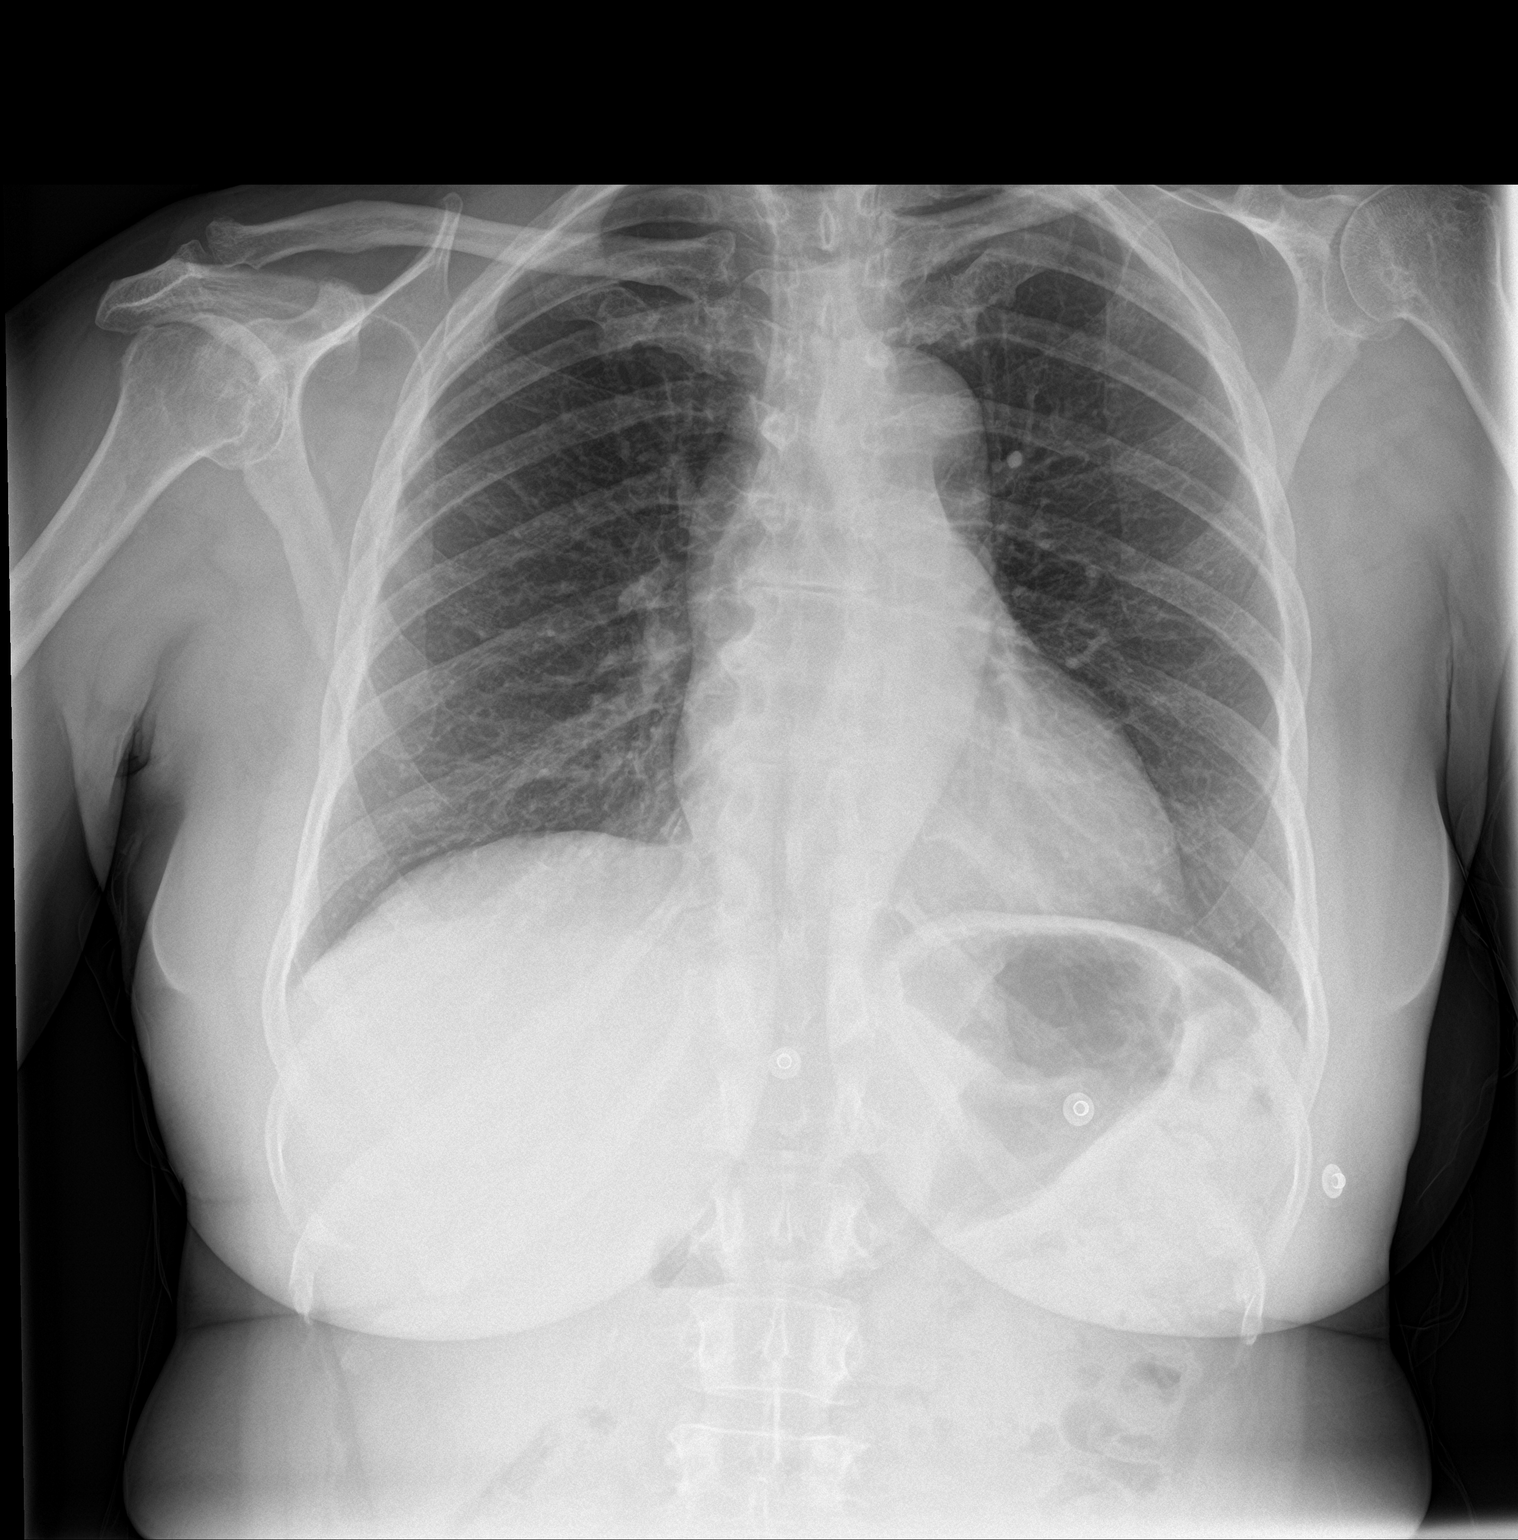

[chest lat]
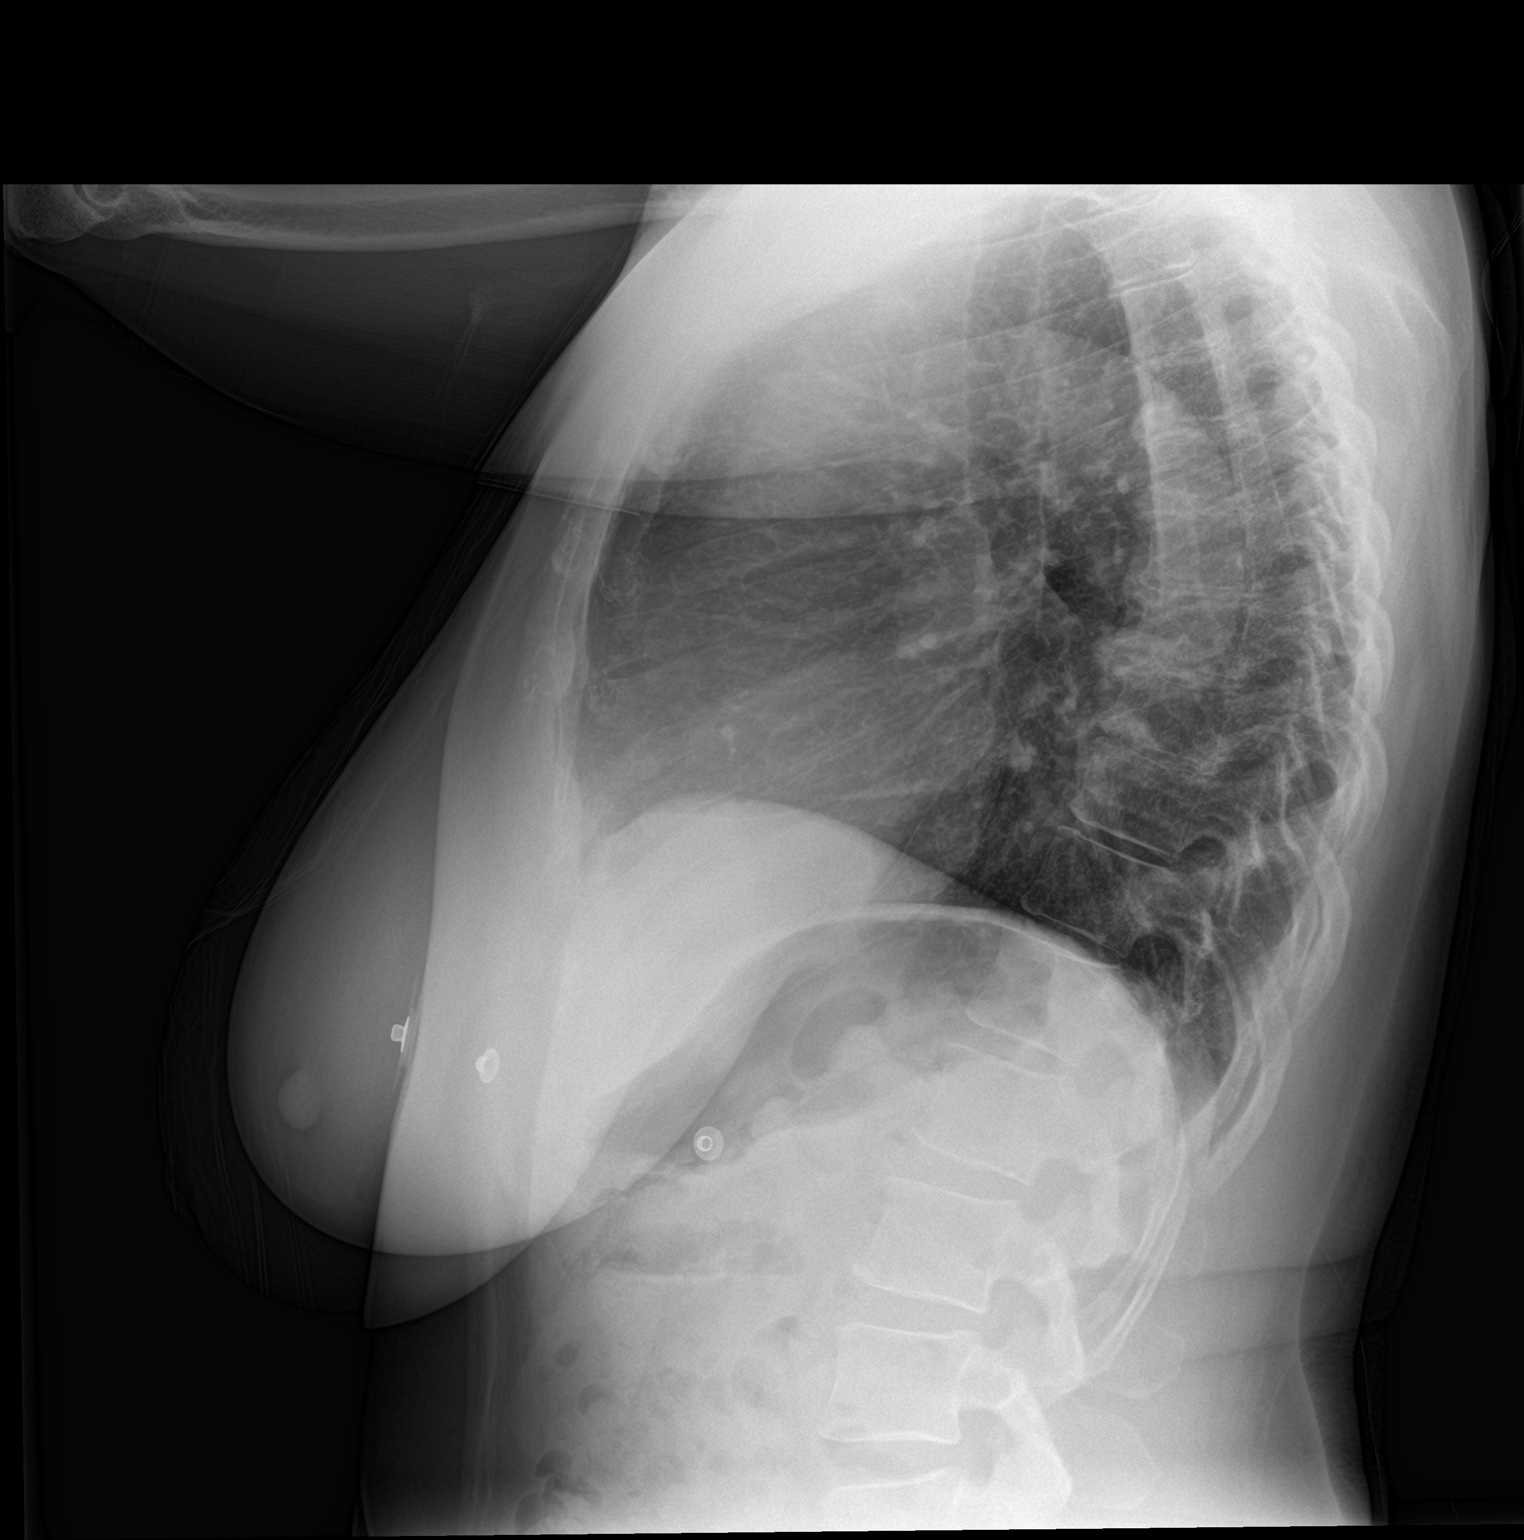

[2 of 2 positions shown; findings below may reference images not displayed]

FINDINGS: The heart size and mediastinal contours are within normal limits. No
focal airspace consolidation, pleural effusion, or pneumothorax.
Multilevel thoracic spondylosis.
IMPRESSION: No active cardiopulmonary disease.

## 2022-10-28 ENCOUNTER — Other Ambulatory Visit: Payer: Self-pay | Admitting: Internal Medicine

## 2023-02-01 ENCOUNTER — Encounter: Payer: Self-pay | Admitting: Internal Medicine

## 2023-02-01 NOTE — Patient Instructions (Addendum)
      Blood work was ordered.   The lab is on the first floor.    Medications changes include :   increase ozempic to 2 mg weekly     Return in about 6 months (around 08/03/2023) for Physical Exam.

## 2023-02-01 NOTE — Progress Notes (Unsigned)
Subjective:    Patient ID: Sylvia Conner, female    DOB: 10/09/1955, 67 y.o.   MRN: [id number]     HPI Sylvia Conner is here for follow up of her chronic medical problems.  Does not have Medicare part B insurance and has been going to [employer] - has been taking ozempic 1 mg weekly.  She feels it is not working as well as when she first started it.  Going to the gym 5 days a week. Eating pretty good.    Taking potassium and crestor once in a while.   Medications and allergies reviewed with patient and updated if appropriate.  Current Outpatient Medications on File Prior to Visit  Medication Sig Dispense Refill   blood glucose meter kit and supplies KIT Dispense based on patient and insurance preference. Use up to four times daily as directed. (FOR E11.9). 1 each 0   levocetirizine (XYZAL) 5 MG tablet Take 1 tablet (5 mg total) by mouth every evening. 30 tablet 5   meloxicam (MOBIC) 15 MG tablet Take 1 tablet (15 mg total) by mouth daily. (Patient taking differently: Take 15 mg by mouth daily as needed.) 20 tablet 0   metFORMIN (GLUCOPHAGE) 1000 MG tablet TAKE 1 TABLET BY MOUTH TWICE DAILY WITH A MEAL 180 tablet 0   Multiple Vitamins-Minerals (HAIR SKIN AND NAILS FORMULA PO) Take 1 tablet by mouth daily.     potassium chloride SA (KLOR-CON M) 20 MEQ tablet Take 3 tablets (60 mEq total) by mouth daily. 270 tablet 1   rosuvastatin (CRESTOR) 20 MG tablet Take 1 tablet (20 mg total) by mouth daily. 90 tablet 3   No current facility-administered medications on file prior to visit.     Review of Systems  Constitutional:  Negative for fever.  Respiratory:  Negative for cough, shortness of breath and wheezing.   Cardiovascular:  Negative for chest pain, palpitations and leg swelling.  Neurological:  Positive for light-headedness (occ). Negative for headaches.       Objective:   Vitals:   02/02/23 1314  BP: 120/70  Pulse: 67  Temp: 98 F (36.7 C)  SpO2: 98%   BP  Readings from Last 3 Encounters:  02/02/23 120/70  01/27/22 124/76  12/01/21 120/80   Wt Readings from Last 3 Encounters:  02/02/23 153 lb (69.4 kg)  01/27/22 147 lb (66.7 kg)  12/01/21 144 lb (65.3 kg)   Body mass index is 27.54 kg/m.    Physical Exam Constitutional:      General: She is not in acute distress.    Appearance: Normal appearance.  HENT:     Head: Normocephalic and atraumatic.  Eyes:     Conjunctiva/sclera: Conjunctivae normal.  Cardiovascular:     Rate and Rhythm: Normal rate and regular rhythm.     Heart sounds: Murmur (2/6 sys) heard.  Pulmonary:     Effort: Pulmonary effort is normal. No respiratory distress.     Breath sounds: Normal breath sounds. No wheezing.  Musculoskeletal:     Cervical back: Neck supple.     Right lower leg: No edema.     Left lower leg: No edema.  Lymphadenopathy:     Cervical: No cervical adenopathy.  Skin:    General: Skin is warm and dry.     Findings: No rash.  Neurological:     Mental Status: She is alert. Mental status is at baseline.  Psychiatric:        Mood and Affect: Mood  normal.        Behavior: Behavior normal.        Lab Results  Component Value Date   WBC 5.5 01/27/2022   HGB 11.8 (L) 01/27/2022   HCT 37.6 01/27/2022   PLT 292.0 01/27/2022   GLUCOSE 173 (H) 01/27/2022   CHOL 259 (H) 01/27/2022   TRIG 121.0 01/27/2022   HDL 90.00 01/27/2022   LDLCALC 145 (H) 01/27/2022   ALT 31 01/27/2022   AST 31 01/27/2022   NA 135 01/27/2022   K 3.6 01/27/2022   CL 98 01/27/2022   CREATININE 0.89 01/27/2022   BUN 13 01/27/2022   CO2 31 01/27/2022   TSH 9.52 (H) 01/27/2022   HGBA1C 7.9 (H) 01/27/2022   MICROALBUR <0.7 01/27/2022     Assessment & Plan:    See Problem List for Assessment and Plan of chronic medical problems.

## 2023-02-02 ENCOUNTER — Ambulatory Visit: Payer: Self-pay | Admitting: Internal Medicine

## 2023-02-02 VITALS — BP 120/70 | HR 67 | Temp 98.0°F | Ht 62.5 in | Wt 153.0 lb

## 2023-02-02 DIAGNOSIS — I10 Essential (primary) hypertension: Secondary | ICD-10-CM

## 2023-02-02 DIAGNOSIS — Z794 Long term (current) use of insulin: Secondary | ICD-10-CM

## 2023-02-02 DIAGNOSIS — E1165 Type 2 diabetes mellitus with hyperglycemia: Secondary | ICD-10-CM

## 2023-02-02 DIAGNOSIS — E039 Hypothyroidism, unspecified: Secondary | ICD-10-CM

## 2023-02-02 DIAGNOSIS — E782 Mixed hyperlipidemia: Secondary | ICD-10-CM

## 2023-02-02 DIAGNOSIS — E876 Hypokalemia: Secondary | ICD-10-CM

## 2023-02-02 LAB — CBC WITH DIFFERENTIAL/PLATELET
Basophils Absolute: 0.1 10*3/uL (ref 0.0–0.1)
Basophils Relative: 1.2 % (ref 0.0–3.0)
Eosinophils Absolute: 0.1 10*3/uL (ref 0.0–0.7)
Eosinophils Relative: 1.7 % (ref 0.0–5.0)
HCT: 36.9 % (ref 36.0–46.0)
Hemoglobin: 11.5 g/dL — ABNORMAL LOW (ref 12.0–15.0)
Lymphocytes Relative: 37.2 % (ref 12.0–46.0)
Lymphs Abs: 1.7 10*3/uL (ref 0.7–4.0)
MCHC: 31.1 g/dL (ref 30.0–36.0)
MCV: 78.1 fL (ref 78.0–100.0)
Monocytes Absolute: 0.3 10*3/uL (ref 0.1–1.0)
Monocytes Relative: 5.8 % (ref 3.0–12.0)
Neutro Abs: 2.5 10*3/uL (ref 1.4–7.7)
Neutrophils Relative %: 54.1 % (ref 43.0–77.0)
Platelets: 293 10*3/uL (ref 150.0–400.0)
RBC: 4.73 Mil/uL (ref 3.87–5.11)
RDW: 13.6 % (ref 11.5–15.5)
WBC: 4.6 10*3/uL (ref 4.0–10.5)

## 2023-02-02 LAB — COMPREHENSIVE METABOLIC PANEL
ALT: 15 U/L (ref 0–35)
AST: 22 U/L (ref 0–37)
Albumin: 4.2 g/dL (ref 3.5–5.2)
Alkaline Phosphatase: 102 U/L (ref 39–117)
BUN: 14 mg/dL (ref 6–23)
CO2: 32 meq/L (ref 19–32)
Calcium: 10 mg/dL (ref 8.4–10.5)
Chloride: 97 meq/L (ref 96–112)
Creatinine, Ser: 1 mg/dL (ref 0.40–1.20)
GFR: 58.95 mL/min — ABNORMAL LOW (ref >60.00)
Glucose, Bld: 223 mg/dL — ABNORMAL HIGH (ref 70–99)
Potassium: 3.7 meq/L (ref 3.5–5.1)
Sodium: 137 meq/L (ref 135–145)
Total Bilirubin: 0.6 mg/dL (ref 0.2–1.2)
Total Protein: 7.9 g/dL (ref 6.0–8.3)

## 2023-02-02 LAB — MICROALBUMIN / CREATININE URINE RATIO
Creatinine,U: 83.1 mg/dL
Microalb Creat Ratio: 0.8 mg/g (ref 0.0–30.0)
Microalb, Ur: <0.7 mg/dL (ref 0.0–1.9)

## 2023-02-02 LAB — TSH: TSH: 9.94 u[IU]/mL — ABNORMAL HIGH (ref 0.35–5.50)

## 2023-02-02 LAB — LIPID PANEL
Cholesterol: 237 mg/dL — ABNORMAL HIGH (ref 0–200)
HDL: 79.6 mg/dL (ref >39.00)
LDL Cholesterol: 132 mg/dL — ABNORMAL HIGH (ref 0–99)
NonHDL: 157.08
Total CHOL/HDL Ratio: 3
Triglycerides: 123 mg/dL (ref 0.0–149.0)
VLDL: 24.6 mg/dL (ref 0.0–40.0)

## 2023-02-02 LAB — HEMOGLOBIN A1C: Hgb A1c MFr Bld: 8.6 % — ABNORMAL HIGH (ref 4.6–6.5)

## 2023-02-02 MED ORDER — LEVOTHYROXINE SODIUM 125 MCG PO TABS: 125.0000 ug | ORAL_TABLET | Freq: Every day | ORAL | 1 refills | Status: DC

## 2023-02-02 MED ORDER — TRIAMTERENE-HCTZ 75-50 MG PO TABS: 1.0000 | ORAL_TABLET | Freq: Every morning | ORAL | 1 refills | Status: DC

## 2023-02-02 MED ORDER — SEMAGLUTIDE (2 MG/DOSE) 8 MG/3ML ~~LOC~~ SOPN: 2.0000 mg | PEN_INJECTOR | SUBCUTANEOUS | 1 refills | Status: DC

## 2023-02-02 NOTE — Addendum Note (Signed)
Addended by: Pincus Sanes on: 02/02/2023 08:38 PM   Modules accepted: Orders

## 2023-02-02 NOTE — Assessment & Plan Note (Signed)
Chronic Related to diuretic Currently only taking potassium on occasion CMP today

## 2023-02-02 NOTE — Assessment & Plan Note (Signed)
Chronic Blood pressure well controlled CMP, CBC Continue triamterene-hydrochlorothiazide 75-50 mg daily

## 2023-02-02 NOTE — Assessment & Plan Note (Signed)
Chronic  Lab Results  Component Value Date   HGBA1C 7.9 (H) 01/27/2022   Check A1c, urine microalbumin Continue metformin 1000 mg twice daily Increase Ozempic to 2 mg weekly Continue regular exercise, diabetic diet

## 2023-02-02 NOTE — Assessment & Plan Note (Signed)
Chronic Continue regular exercise and healthy diet  Check lipid panel  Has only been taking Crestor 20 mg daily on occasion Will see with the lipid panel looks like and likely advised taking medication daily

## 2023-02-02 NOTE — Assessment & Plan Note (Signed)
Chronic  Clinically euthyroid Check tsh and will titrate med dose if needed Currently taking levothyroxine 125 mcg daily 6 days a week

## 2023-03-18 ENCOUNTER — Encounter: Payer: Self-pay | Admitting: Internal Medicine

## 2023-03-18 NOTE — Telephone Encounter (Signed)
 Care team updated and letter sent for eye exam notes.

## 2023-04-11 ENCOUNTER — Telehealth: Payer: Self-pay

## 2023-04-11 NOTE — Telephone Encounter (Signed)
 Copied from CRM [id number]. Topic: Clinical - Lab/Test Results >> Apr 11, 2023  9:45 AM Sylvia Conner H wrote:  Reason for CRM: Patient is inquiring about lab results from last week, please reach out to patient, thank you.   Sylvia Conner [phone]

## 2023-04-14 NOTE — Telephone Encounter (Signed)
 Message left for patient to return call to clinic.  I do not see where she has had recent labs with Dr. Geofm since October 2024.  If she calls back please see where she had labs done as she may need to refer back that provider if she is looking or has questions about results.

## 2023-04-15 NOTE — Telephone Encounter (Signed)
 Spoke with patient and questions answered.

## 2023-04-15 NOTE — Telephone Encounter (Signed)
 Reason for CRM: Patient returning missed call from [place]. Read notes. Asked what labs patient was referring to. Patient stated the last labs she had at the practice. Advised that was October and the results were sent via mail 11/7. Patient states she did not receive results and had a question for the clinician about them. Requested to speak to [facility]. Thank you.

## 2023-04-18 ENCOUNTER — Emergency Department (HOSPITAL_COMMUNITY)
Admission: EM | Admit: 2023-04-18 | Discharge: 2023-04-18 | Disposition: A | Discharge status: Home / Self Care | Payer: Self-pay | Attending: Emergency Medicine | Admitting: Emergency Medicine

## 2023-04-18 ENCOUNTER — Encounter (HOSPITAL_COMMUNITY): Payer: Self-pay

## 2023-04-18 ENCOUNTER — Other Ambulatory Visit: Payer: Self-pay

## 2023-04-18 ENCOUNTER — Emergency Department (HOSPITAL_COMMUNITY): Payer: Self-pay

## 2023-04-18 DIAGNOSIS — N3000 Acute cystitis without hematuria: Secondary | ICD-10-CM | POA: Insufficient documentation

## 2023-04-18 DIAGNOSIS — Z79899 Other long term (current) drug therapy: Secondary | ICD-10-CM | POA: Insufficient documentation

## 2023-04-18 DIAGNOSIS — E039 Hypothyroidism, unspecified: Secondary | ICD-10-CM | POA: Insufficient documentation

## 2023-04-18 DIAGNOSIS — M545 Low back pain, unspecified: Secondary | ICD-10-CM | POA: Insufficient documentation

## 2023-04-18 DIAGNOSIS — I10 Essential (primary) hypertension: Secondary | ICD-10-CM | POA: Insufficient documentation

## 2023-04-18 DIAGNOSIS — E119 Type 2 diabetes mellitus without complications: Secondary | ICD-10-CM | POA: Insufficient documentation

## 2023-04-18 DIAGNOSIS — Z7984 Long term (current) use of oral hypoglycemic drugs: Secondary | ICD-10-CM | POA: Insufficient documentation

## 2023-04-18 LAB — COMPREHENSIVE METABOLIC PANEL
ALT: 28 U/L (ref 0–44)
AST: 31 U/L (ref 15–41)
Albumin: 3.9 g/dL (ref 3.5–5.0)
Alkaline Phosphatase: 99 U/L (ref 38–126)
Anion gap: 10 (ref 5–15)
BUN: 13 mg/dL (ref 8–23)
CO2: 26 mmol/L (ref 22–32)
Calcium: 9.1 mg/dL (ref 8.9–10.3)
Chloride: 98 mmol/L (ref 98–111)
Creatinine, Ser: 0.71 mg/dL (ref 0.44–1.00)
GFR, Estimated: >60 mL/min (ref >60)
Glucose, Bld: 171 mg/dL — ABNORMAL HIGH (ref 70–99)
Potassium: 3.8 mmol/L (ref 3.5–5.1)
Sodium: 134 mmol/L — ABNORMAL LOW (ref 135–145)
Total Bilirubin: 0.9 mg/dL (ref 0.0–1.2)
Total Protein: 8 g/dL (ref 6.5–8.1)

## 2023-04-18 LAB — CBC
HCT: 36.7 % (ref 36.0–46.0)
Hemoglobin: 11.4 g/dL — ABNORMAL LOW (ref 12.0–15.0)
MCH: 24.6 pg — ABNORMAL LOW (ref 26.0–34.0)
MCHC: 31.1 g/dL (ref 30.0–36.0)
MCV: 79.3 fL — ABNORMAL LOW (ref 80.0–100.0)
Platelets: 293 10*3/uL (ref 150–400)
RBC: 4.63 MIL/uL (ref 3.87–5.11)
RDW: 13.5 % (ref 11.5–15.5)
WBC: 4.6 10*3/uL (ref 4.0–10.5)
nRBC: 0 % (ref 0.0–0.2)

## 2023-04-18 LAB — URINALYSIS, ROUTINE W REFLEX MICROSCOPIC
Bilirubin Urine: NEGATIVE
Glucose, UA: 50 mg/dL — AB
Hgb urine dipstick: NEGATIVE
Ketones, ur: NEGATIVE mg/dL
Nitrite: NEGATIVE
Protein, ur: NEGATIVE mg/dL
Specific Gravity, Urine: 1.02 (ref 1.005–1.030)
pH: 6 (ref 5.0–8.0)

## 2023-04-18 LAB — LIPASE, BLOOD: Lipase: 45 U/L (ref 11–51)

## 2023-04-18 MED ORDER — SODIUM CHLORIDE 0.9 % IV BOLUS
1000.0000 mL | Freq: Once | INTRAVENOUS | Status: AC
  Administered 2023-04-18: 1000 mL via INTRAVENOUS

## 2023-04-18 MED ORDER — MELOXICAM 15 MG PO TABS: 15.0000 mg | ORAL_TABLET | Freq: Every day | ORAL | 0 refills | Status: DC

## 2023-04-18 MED ORDER — HYDROCODONE-ACETAMINOPHEN 5-325 MG PO TABS: 1.0000 | ORAL_TABLET | ORAL | 0 refills | Status: DC | PRN

## 2023-04-18 MED ORDER — CIPROFLOXACIN HCL 500 MG PO TABS
500.0000 mg | ORAL_TABLET | Freq: Once | ORAL | Status: AC
  Administered 2023-04-18: 500 mg via ORAL
  Filled 2023-04-18: qty 1

## 2023-04-18 MED ORDER — OXYCODONE-ACETAMINOPHEN 5-325 MG PO TABS
1.0000 | ORAL_TABLET | ORAL | Status: AC | PRN
  Administered 2023-04-18 (×2): 1 via ORAL
  Filled 2023-04-18 (×2): qty 1

## 2023-04-18 MED ORDER — CIPROFLOXACIN HCL 500 MG PO TABS: 500.0000 mg | ORAL_TABLET | Freq: Two times a day (BID) | ORAL | 0 refills | Status: DC

## 2023-04-18 MED ORDER — IOHEXOL 300 MG/ML  SOLN
100.0000 mL | Freq: Once | INTRAMUSCULAR | Status: AC | PRN
  Administered 2023-04-18: 100 mL via INTRAVENOUS

## 2023-04-18 NOTE — ED Triage Notes (Signed)
 Patient reports lower abdominal pain that radiates to her back x 1 month, and foul smelling urine x 2 days. Took tylenol without relief. Denies nausea, vomiting, and diarrhea.

## 2023-04-18 NOTE — ED Provider Notes (Signed)
 [name] EMERGENCY DEPARTMENT AT [facility] Provider Note   CSN: [id number] Arrival date & time: 04/18/23  0847     History  Chief Complaint  Patient presents with   Abdominal Pain    Sylvia Conner is a 68 y.o. female.  Pt is a 68 yo female with pmhx significant for arthritis, dm, htn, anemia, and hypothyroidism.  Pt said she has LLQ abd pain that radiates to her back.  Sx have been going on for about a month.  For the past 2 days, she's had some foul smelling  urine.  She denies n/v/d.  No fever.  Pain is now in her low back on both sides.  Pt was given a percocet in triage which has helped with pain.       Home Medications Prior to Admission medications   Medication Sig Start Date End Date Taking? Authorizing Provider  ciprofloxacin  (CIPRO ) 500 MG tablet Take 1 tablet (500 mg total) by mouth 2 (two) times daily. 04/18/23  Yes Dean Clarity, MD  HYDROcodone -acetaminophen  (NORCO/VICODIN) 5-325 MG tablet Take 1 tablet by mouth every 4 (four) hours as needed. 04/18/23  Yes Dean Clarity, MD  meloxicam  (MOBIC ) 15 MG tablet Take 1 tablet (15 mg total) by mouth daily. 04/18/23  Yes Dean Clarity, MD  blood glucose meter kit and supplies KIT Dispense based on patient and insurance preference. Use up to four times daily as directed. (FOR E11.9). 02/01/20   Geofm Glade PARAS, MD  levocetirizine (XYZAL ) 5 MG tablet Take 1 tablet (5 mg total) by mouth every evening. 01/27/22   Geofm Glade PARAS, MD  levothyroxine  (SYNTHROID ) 125 MCG tablet Take 1 tablet (125 mcg total) by mouth daily. Take 6 days a week.  Take 1/2 tab once a week 02/02/23   Geofm Glade PARAS, MD  meloxicam  (MOBIC ) 15 MG tablet Take 1 tablet (15 mg total) by mouth daily. Patient taking differently: Take 15 mg by mouth daily as needed. 08/18/20   Geofm Glade PARAS, MD  metFORMIN  (GLUCOPHAGE ) 1000 MG tablet TAKE 1 TABLET BY MOUTH TWICE DAILY WITH A MEAL 08/31/22   Burns, Glade PARAS, MD  Multiple Vitamins-Minerals (HAIR SKIN AND  NAILS FORMULA PO) Take 1 tablet by mouth daily.    [provider]  potassium chloride  SA (KLOR-CON  M) 20 MEQ tablet Take 3 tablets (60 mEq total) by mouth daily. 12/02/21   Geofm Glade PARAS, MD  rosuvastatin  (CRESTOR ) 20 MG tablet Take 1 tablet (20 mg total) by mouth daily. 01/27/22   Geofm Glade PARAS, MD  Semaglutide , 2 MG/DOSE, 8 MG/3ML SOPN Inject 2 mg as directed once a week. 02/02/23   Geofm Glade PARAS, MD  triamterene -hydrochlorothiazide (MAXZIDE) 75-50 MG tablet Take 1 tablet by mouth every morning. 02/02/23   Geofm Glade PARAS, MD      Allergies    Penicillins, Glipizide , Januvia  [sitagliptin ], Sulfa antibiotics, Jardiance  [empagliflozin ], and Scallops [shellfish allergy]    Review of Systems   Review of Systems  Gastrointestinal:  Positive for abdominal pain.  Musculoskeletal:  Positive for back pain.  All other systems reviewed and are negative.   Physical Exam Updated Vital Signs BP (!) 142/84 (BP Location: Left Arm)   Pulse 80   Temp 98.3 F (36.8 C) (Oral)   Resp 16   Ht 5' 2.5 (1.588 m)   Wt 67.1 kg   SpO2 100%   BMI 26.64 kg/m  Physical Exam Vitals and nursing note reviewed.  Constitutional:      Appearance: She  is well-developed.  HENT:     Head: Normocephalic and atraumatic.     Mouth/Throat:     Mouth: Mucous membranes are moist.     Pharynx: Oropharynx is clear.  Eyes:     Extraocular Movements: Extraocular movements intact.     Pupils: Pupils are equal, round, and reactive to light.  Cardiovascular:     Rate and Rhythm: Normal rate and regular rhythm.     Heart sounds: Normal heart sounds.  Pulmonary:     Effort: Pulmonary effort is normal.     Breath sounds: Normal breath sounds.  Abdominal:     General: Abdomen is flat. Bowel sounds are normal.     Palpations: Abdomen is soft.     Tenderness: There is abdominal tenderness in the left lower quadrant.  Skin:    General: Skin is warm.     Capillary Refill: Capillary refill takes less than 2  seconds.  Neurological:     General: No focal deficit present.     Mental Status: She is alert and oriented to person, place, and time.  Psychiatric:        Mood and Affect: Mood normal.        Behavior: Behavior normal.     ED Results / Procedures / Treatments   Labs (all labs ordered are listed, but only abnormal results are displayed) Labs Reviewed  COMPREHENSIVE METABOLIC PANEL - Abnormal; Notable for the following components:      Result Value   Sodium 134 (*)    Glucose, Bld 171 (*)    All other components within normal limits  CBC - Abnormal; Notable for the following components:   Hemoglobin 11.4 (*)    MCV 79.3 (*)    MCH 24.6 (*)    All other components within normal limits  URINALYSIS, ROUTINE W REFLEX MICROSCOPIC - Abnormal; Notable for the following components:   APPearance HAZY (*)    Glucose, UA 50 (*)    Leukocytes,Ua LARGE (*)    Bacteria, UA RARE (*)    All other components within normal limits  LIPASE, BLOOD    EKG None  Radiology CT ABDOMEN PELVIS W CONTRAST Result Date: 04/18/2023 CLINICAL DATA:  Left lower quadrant abdominal pain. EXAM: CT ABDOMEN AND PELVIS WITH CONTRAST TECHNIQUE: Multidetector CT imaging of the abdomen and pelvis was performed using the standard protocol following bolus administration of intravenous contrast. RADIATION DOSE REDUCTION: This exam was performed according to the departmental dose-optimization program which includes automated exposure control, adjustment of the mA and/or kV according to patient size and/or use of iterative reconstruction technique. CONTRAST:  OMNIPAQUE  IOHEXOL  300 MG/ML  SOLN COMPARISON:  CT scan abdomen and pelvis from 12/24/2021. FINDINGS: Lower chest: There are subpleural atelectatic changes in the visualized lung bases. No overt consolidation. No pleural effusion. The heart is normal in size. No pericardial effusion. Hepatobiliary: The liver is normal in size. Non-cirrhotic configuration. No  suspicious mass. These is mild diffuse hepatic steatosis. No intrahepatic or extrahepatic bile duct dilation. No calcified gallstones. Normal gallbladder wall thickness. No pericholecystic inflammatory changes. Pancreas: Unremarkable. No pancreatic ductal dilatation or surrounding inflammatory changes. Spleen: Within normal limits. No focal lesion. Adrenals/Urinary Tract: Adrenal glands are unremarkable. No suspicious renal mass. No hydronephrosis. No renal or ureteric calculi. Unremarkable urinary bladder. Stomach/Bowel: No disproportionate dilation of the small or large bowel loops. No evidence of abnormal bowel wall thickening or inflammatory changes. The appendix is unremarkable. Vascular/Lymphatic: No ascites or pneumoperitoneum. No abdominal or pelvic  lymphadenopathy, by size criteria. No aneurysmal dilation of the major abdominal arteries. Reproductive: The uterus is surgically absent. Redemonstration of a well-circumscribed heterogeneous 2.4 x 3.2 cm lesion in the right adnexa, incompletely characterized on the current exam but unchanged since multiple prior studies and favored benign. No left adnexal mass. Other: The visualized soft tissues and abdominal wall are unremarkable. Musculoskeletal: No suspicious osseous lesions. There are mild - moderate multilevel degenerative changes in the visualized spine. IMPRESSION: *No acute inflammatory process identified within the abdomen or pelvis. *Multiple other nonacute observations, as described above. Electronically Signed   By: Ree Molt M.D.   On: 04/18/2023 11:41    Procedures Procedures    Medications Ordered in ED Medications  oxyCODONE -acetaminophen  (PERCOCET/ROXICET) 5-325 MG per tablet 1 tablet (1 tablet Oral Given 04/18/23 0903)  ciprofloxacin  (CIPRO ) tablet 500 mg (has no administration in time range)  sodium chloride  0.9 % bolus 1,000 mL (1,000 mLs Intravenous New Bag/Given 04/18/23 1021)  iohexol  (OMNIPAQUE ) 300 MG/ML solution 100 mL  (100 mLs Intravenous Contrast Given 04/18/23 1049)    ED Course/ Medical Decision Making/ A&P                                 Medical Decision Making Amount and/or Complexity of Data Reviewed Labs: ordered. Radiology: ordered.  Risk Prescription drug management.   This patient presents to the ED for concern of abd pain and back pain, this involves an extensive number of treatment options, and is a complaint that carries with it a high risk of complications and morbidity.  The differential diagnosis includes diverticulitis, uti, msk   Co morbidities that complicate the patient evaluation  arthritis, dm, htn, anemia, and hypothyroidism   Additional history obtained:  Additional history obtained from epic chart review External records from outside source obtained and reviewed including family   Lab Tests:  I Ordered, and personally interpreted labs.  The pertinent results include:  cbc with hgb 11.4 (chronic), UA with lg le; cmp nl other than bs elevated at 171   Imaging Studies ordered:  I ordered imaging studies including ct abd/pelvis  I independently visualized and interpreted imaging which showed  No acute inflammatory process identified within the abdomen or  pelvis.  *Multiple other nonacute observations, as described above.   I agree with the radiologist interpretation  Medicines ordered and prescription drug management:  I ordered medication including ivfs  for sx and percocet for pain Reevaluation of the patient after these medicines showed that the patient improved I have reviewed the patients home medicines and have made adjustments as needed   Test Considered:  ct   Problem List / ED Course:  Urinary sx:  + leuks and symptomatic, so I will start cipro .  She has an anaphylactic rxn to pcn Back pain:  degenerative changes noted on ct.  She is started on mobic  and is given exercises to do.  F/u with pcp.  Return if worse.   Reevaluation:  After  the interventions noted above, I reevaluated the patient and found that they have :improved   Social Determinants of Health:  Lives at home   Dispostion:  After consideration of the diagnostic results and the patients response to treatment, I feel that the patent would benefit from discharge with outpatient f/u.          Final Clinical Impression(s) / ED Diagnoses Final diagnoses:  Acute cystitis without hematuria  Acute bilateral  low back pain without sciatica    Rx / DC Orders ED Discharge Orders          Ordered    ciprofloxacin  (CIPRO ) 500 MG tablet  2 times daily        04/18/23 1150    HYDROcodone -acetaminophen  (NORCO/VICODIN) 5-325 MG tablet  Every 4 hours PRN        04/18/23 1150    meloxicam  (MOBIC ) 15 MG tablet  Daily        04/18/23 1151              Dean Clarity, MD 04/18/23 1152

## 2023-05-04 ENCOUNTER — Ambulatory Visit: Payer: Medicaid Other | Admitting: Internal Medicine

## 2023-05-17 ENCOUNTER — Ambulatory Visit: Payer: Medicaid Other | Admitting: Internal Medicine

## 2023-05-19 ENCOUNTER — Other Ambulatory Visit: Payer: Self-pay | Admitting: Internal Medicine

## 2023-05-19 MED ORDER — HYDROCODONE-ACETAMINOPHEN 5-325 MG PO TABS: 1.0000 | ORAL_TABLET | ORAL | 0 refills | Status: DC | PRN

## 2023-05-19 NOTE — Telephone Encounter (Signed)
Copied from CRM [id number]. Topic: Clinical - Medication Refill >> May 19, 2023  2:59 PM Sim Boast F wrote: Most Recent Primary Care Visit:  Provider: BURNS, Bobette Mo  Department: [facility]  Visit Type: SAME DAY  Date: 02/02/2023  Medication: HYDROcodone - please re write script for back pain from when pt was in hospital   Has the patient contacted their pharmacy? Yes, needs new script   (Agent: If no, request that the patient contact the pharmacy for the refill. If patient does not wish to contact the pharmacy document the reason why and proceed with request.) (Agent: If yes, when and what did the pharmacy advise?)  Is this the correct pharmacy for this prescription? Yes If no, delete pharmacy and type the correct one.  This is the patient's preferred pharmacy:    [facility] [address] [place], [place] - [address] [place]. AT [facility] [address] [place] [place] [address] Phone: [phone] Fax: [phone]   Has the prescription been filled recently? Yes  Is the patient out of the medication? No, 1 more left   Has the patient been seen for an appointment in the last year OR does the patient have an upcoming appointment? Yes  Can we respond through MyChart? Yes  Agent: Please be advised that Rx refills may take up to 3 business days. We ask that you follow-up with your pharmacy.

## 2023-05-23 ENCOUNTER — Other Ambulatory Visit: Payer: Self-pay | Admitting: Internal Medicine

## 2023-05-23 ENCOUNTER — Telehealth: Payer: Self-pay | Admitting: Internal Medicine

## 2023-05-23 MED ORDER — HYDROCODONE-ACETAMINOPHEN 5-325 MG PO TABS: 1.0000 | ORAL_TABLET | ORAL | 0 refills | Status: DC | PRN

## 2023-05-23 NOTE — Telephone Encounter (Unsigned)
Copied from CRM [id number]. Topic: Clinical - Medication Question >> May 23, 2023  9:25 AM Isabell A wrote: Reason for CRM: Reason for CRM: Threasa Beards from [place] calling to confirm for HYDROcodone-acetaminophen (NORCO/VICODIN) 5-325 MG tablet - the diagnosis code & if its for chronic or acute pain.  Callback number: [phone]

## 2023-05-24 ENCOUNTER — Other Ambulatory Visit: Payer: Self-pay | Admitting: Internal Medicine

## 2023-05-24 ENCOUNTER — Ambulatory Visit: Payer: Self-pay | Admitting: Internal Medicine

## 2023-05-24 NOTE — Telephone Encounter (Signed)
 Copied from CRM [id number]. Topic: Clinical - Medication Refill >> May 19, 2023  2:59 PM Sim Boast F wrote: Most Recent Primary Care Visit:  Provider: BURNS, Bobette Mo  Department: [facility]  Visit Type: SAME DAY  Date: 02/02/2023  Medication: HYDROcodone - please re write script for back pain from when pt was in hospital   Has the patient contacted their pharmacy? Yes, needs new script   (Agent: If no, request that the patient contact the pharmacy for the refill. If patient does not wish to contact the pharmacy document the reason why and proceed with request.) (Agent: If yes, when and what did the pharmacy advise?)  Is this the correct pharmacy for this prescription? Yes If no, delete pharmacy and type the correct one.  This is the patient's preferred pharmacy:    [facility] [address] [place], [place] - [address] [place]. AT [facility] [address] [place] [place] [address] Phone: [phone] Fax: [phone]   Has the prescription been filled recently? Yes  Is the patient out of the medication? No, 1 more left   Has the patient been seen for an appointment in the last year OR does the patient have an upcoming appointment? Yes  Can we respond through MyChart? Yes  Agent: Please be advised that Rx refills may take up to 3 business days. We ask that you follow-up with your pharmacy.

## 2023-05-24 NOTE — Telephone Encounter (Signed)
Called and spoke with Threasa Beards and info given.

## 2023-05-24 NOTE — Telephone Encounter (Signed)
This was sent in yesterday by Dr. Jonny Ruiz.

## 2023-06-01 NOTE — Patient Instructions (Addendum)
      Blood work was ordered.       Medications changes include :   None    A referral was ordered and someone will call you to schedule an appointment.     Return in about 6 months (around 11/30/2023) for follow up.

## 2023-06-01 NOTE — Progress Notes (Signed)
      Subjective:    Patient ID: Sylvia Conner, female    DOB: 04/20/1955, 68 y.o.   MRN: [id number]     HPI Sylvia Conner is here for follow up of her chronic medical problems.  Back pain - ED 04/18/23 UTI, b/l lower back pain.  Medications and allergies reviewed with patient and updated if appropriate.  Current Outpatient Medications on File Prior to Visit  Medication Sig Dispense Refill  . blood glucose meter kit and supplies KIT Dispense based on patient and insurance preference. Use up to four times daily as directed. (FOR E11.9). 1 each 0  . HYDROcodone -acetaminophen  (NORCO/VICODIN) 5-325 MG tablet Take 1 tablet by mouth every 4 (four) hours as needed. 10 tablet 0  . levocetirizine (XYZAL ) 5 MG tablet Take 1 tablet (5 mg total) by mouth every evening. 30 tablet 5  . levothyroxine  (SYNTHROID ) 125 MCG tablet Take 1 tablet (125 mcg total) by mouth daily. Take 6 days a week.  Take 1/2 tab once a week 90 tablet 1  . meloxicam  (MOBIC ) 15 MG tablet Take 1 tablet (15 mg total) by mouth daily. 30 tablet 0  . metFORMIN  (GLUCOPHAGE ) 1000 MG tablet TAKE 1 TABLET BY MOUTH TWICE DAILY WITH A MEAL 180 tablet 0  . Multiple Vitamins-Minerals (HAIR SKIN AND NAILS FORMULA PO) Take 1 tablet by mouth daily.    . potassium chloride  SA (KLOR-CON  M) 20 MEQ tablet Take 3 tablets (60 mEq total) by mouth daily. 270 tablet 1  . rosuvastatin  (CRESTOR ) 20 MG tablet Take 1 tablet (20 mg total) by mouth daily. 90 tablet 3  . Semaglutide , 2 MG/DOSE, 8 MG/3ML SOPN Inject 2 mg as directed once a week. 9 mL 1  . triamterene -hydrochlorothiazide (MAXZIDE) 75-50 MG tablet Take 1 tablet by mouth every morning. 90 tablet 1   No current facility-administered medications on file prior to visit.     Review of Systems     Objective:  There were no vitals filed for this visit. BP Readings from Last 3 Encounters:  07/20/23 (!) 152/98  06/12/23 (!) 144/88  04/18/23 (!) 147/80   Wt Readings from Last 3 Encounters:   07/20/23 151 lb 0.2 oz (68.5 kg)  06/12/23 151 lb (68.5 kg)  04/18/23 148 lb (67.1 kg)   There is no height or weight on file to calculate BMI.    Physical Exam     Lab Results  Component Value Date   WBC 4.6 04/18/2023   HGB 11.4 (L) 04/18/2023   HCT 36.7 04/18/2023   PLT 293 04/18/2023   GLUCOSE 171 (H) 04/18/2023   CHOL 237 (H) 02/02/2023   TRIG 123.0 02/02/2023   HDL 79.60 02/02/2023   LDLCALC 132 (H) 02/02/2023   ALT 28 04/18/2023   AST 31 04/18/2023   NA 134 (L) 04/18/2023   K 3.8 04/18/2023   CL 98 04/18/2023   CREATININE 0.71 04/18/2023   BUN 13 04/18/2023   CO2 26 04/18/2023   TSH 9.94 (H) 02/02/2023   HGBA1C 8.6 (H) 02/02/2023   MICROALBUR <0.7 02/02/2023     Assessment & Plan:    See Problem List for Assessment and Plan of chronic medical problems.    This encounter was created in error - please disregard.

## 2023-06-02 ENCOUNTER — Encounter: Payer: Medicaid Other | Admitting: Internal Medicine

## 2023-06-02 ENCOUNTER — Encounter: Payer: Self-pay | Admitting: Internal Medicine

## 2023-06-02 DIAGNOSIS — E876 Hypokalemia: Secondary | ICD-10-CM

## 2023-06-02 DIAGNOSIS — E1165 Type 2 diabetes mellitus with hyperglycemia: Secondary | ICD-10-CM

## 2023-06-02 DIAGNOSIS — I10 Essential (primary) hypertension: Secondary | ICD-10-CM

## 2023-06-02 DIAGNOSIS — E782 Mixed hyperlipidemia: Secondary | ICD-10-CM

## 2023-06-02 DIAGNOSIS — E039 Hypothyroidism, unspecified: Secondary | ICD-10-CM

## 2023-06-02 NOTE — Assessment & Plan Note (Signed)
 Chronic Continue regular exercise and healthy diet  Check lipid panel, cmp Has only been taking Crestor 20 mg daily on occasion

## 2023-06-02 NOTE — Assessment & Plan Note (Signed)
 Chronic  Clinically euthyroid Check tsh and will titrate med dose if needed Currently taking levothyroxine 125 mcg daily 6 days a week, taking 62.5 mg once a week

## 2023-06-02 NOTE — Telephone Encounter (Signed)
 Prescription refused - overdue for appointment.

## 2023-06-02 NOTE — Assessment & Plan Note (Signed)
 Chronic  Lab Results  Component Value Date   HGBA1C 8.6 (H) 02/02/2023   Check A1c Continue metformin 1000 mg twice daily Increase Ozempic to 2 mg weekly Continue regular exercise, diabetic diet

## 2023-06-02 NOTE — Assessment & Plan Note (Signed)
 Chronic Related to diuretic Currently only taking potassium on occasion CMP today

## 2023-06-02 NOTE — Assessment & Plan Note (Signed)
 Chronic Blood pressure well controlled CMP, CBC Continue triamterene-hydrochlorothiazide 75-50 mg daily

## 2023-06-02 NOTE — Telephone Encounter (Signed)
 LOV: 02/02/23 Last fill: 05/23/23, 10 tablet 0 refill

## 2023-06-12 ENCOUNTER — Encounter (HOSPITAL_COMMUNITY): Payer: Self-pay

## 2023-06-12 ENCOUNTER — Emergency Department (HOSPITAL_COMMUNITY)
Admission: EM | Admit: 2023-06-12 | Discharge: 2023-06-12 | Disposition: A | Discharge status: Home / Self Care | Payer: Self-pay | Attending: Emergency Medicine | Admitting: Emergency Medicine

## 2023-06-12 ENCOUNTER — Emergency Department (HOSPITAL_COMMUNITY): Payer: Self-pay

## 2023-06-12 ENCOUNTER — Other Ambulatory Visit: Payer: Self-pay

## 2023-06-12 DIAGNOSIS — Z794 Long term (current) use of insulin: Secondary | ICD-10-CM | POA: Insufficient documentation

## 2023-06-12 DIAGNOSIS — E119 Type 2 diabetes mellitus without complications: Secondary | ICD-10-CM | POA: Insufficient documentation

## 2023-06-12 DIAGNOSIS — M5432 Sciatica, left side: Secondary | ICD-10-CM

## 2023-06-12 DIAGNOSIS — Z79899 Other long term (current) drug therapy: Secondary | ICD-10-CM | POA: Insufficient documentation

## 2023-06-12 DIAGNOSIS — M5442 Lumbago with sciatica, left side: Secondary | ICD-10-CM | POA: Insufficient documentation

## 2023-06-12 DIAGNOSIS — I10 Essential (primary) hypertension: Secondary | ICD-10-CM | POA: Insufficient documentation

## 2023-06-12 DIAGNOSIS — Z7984 Long term (current) use of oral hypoglycemic drugs: Secondary | ICD-10-CM | POA: Insufficient documentation

## 2023-06-12 MED ORDER — TRAMADOL HCL 50 MG PO TABS: 50.0000 mg | ORAL_TABLET | Freq: Four times a day (QID) | ORAL | 0 refills | Status: DC | PRN

## 2023-06-12 MED ORDER — CYCLOBENZAPRINE HCL 10 MG PO TABS: 5.0000 mg | ORAL_TABLET | Freq: Two times a day (BID) | ORAL | 0 refills | Status: DC | PRN

## 2023-06-12 MED ORDER — DEXAMETHASONE SODIUM PHOSPHATE 10 MG/ML IJ SOLN
10.0000 mg | Freq: Once | INTRAMUSCULAR | Status: AC
  Administered 2023-06-12: 10 mg via INTRAMUSCULAR
  Filled 2023-06-12: qty 1

## 2023-06-12 MED ORDER — NAPROXEN 375 MG PO TABS: 375.0000 mg | ORAL_TABLET | Freq: Two times a day (BID) | ORAL | 0 refills | Status: DC

## 2023-06-12 MED ORDER — KETOROLAC TROMETHAMINE 60 MG/2ML IM SOLN
30.0000 mg | Freq: Once | INTRAMUSCULAR | Status: AC
  Administered 2023-06-12: 30 mg via INTRAMUSCULAR
  Filled 2023-06-12: qty 2

## 2023-06-12 NOTE — ED Provider Notes (Signed)
 [place] EMERGENCY DEPARTMENT AT [facility] Provider Note   CSN: [id number] Arrival date & time: 06/12/23  4098     History  Chief Complaint  Patient presents with   Back Pain    Sylvia Conner is a 68 y.o. female who  has a past medical history of Anemia, Arthritis, Diabetes mellitus, Hypertension, Thyroid disease, and UTI (lower urinary tract infection). She presents with a cc of left sided back pain . Pain located on the Left lower back Radiates to the Left posterior leg all the way down to the foot. Worse with for long periods of time, changing position, standing and ambulation Better with rest She has tried ibuprofen without much relief. Denies weakness, loss of bowel/bladder function or saddle anesthesia.   Denies fever or recent procedures to back.     Back Pain      Home Medications Prior to Admission medications   Medication Sig Start Date End Date Taking? Authorizing Provider  blood glucose meter kit and supplies KIT Dispense based on patient and insurance preference. Use up to four times daily as directed. (FOR E11.9). 02/01/20   Pincus Sanes, MD  HYDROcodone-acetaminophen (NORCO/VICODIN) 5-325 MG tablet Take 1 tablet by mouth every 4 (four) hours as needed. 05/23/23   Corwin Levins, MD  levocetirizine (XYZAL) 5 MG tablet Take 1 tablet (5 mg total) by mouth every evening. 01/27/22   Pincus Sanes, MD  levothyroxine (SYNTHROID) 125 MCG tablet Take 1 tablet (125 mcg total) by mouth daily. Take 6 days a week.  Take 1/2 tab once a week 02/02/23   Pincus Sanes, MD  meloxicam (MOBIC) 15 MG tablet Take 1 tablet (15 mg total) by mouth daily. 04/18/23   Jacalyn Lefevre, MD  metFORMIN (GLUCOPHAGE) 1000 MG tablet TAKE 1 TABLET BY MOUTH TWICE DAILY WITH A MEAL 08/31/22   Burns, Bobette Mo, MD  Multiple Vitamins-Minerals (HAIR SKIN AND NAILS FORMULA PO) Take 1 tablet by mouth daily.    [provider]  potassium chloride SA (KLOR-CON M) 20 MEQ tablet Take 3  tablets (60 mEq total) by mouth daily. 12/02/21   Pincus Sanes, MD  rosuvastatin (CRESTOR) 20 MG tablet Take 1 tablet (20 mg total) by mouth daily. 01/27/22   Pincus Sanes, MD  Semaglutide, 2 MG/DOSE, 8 MG/3ML SOPN Inject 2 mg as directed once a week. 02/02/23   Pincus Sanes, MD  triamterene-hydrochlorothiazide (MAXZIDE) 75-50 MG tablet Take 1 tablet by mouth every morning. 02/02/23   Pincus Sanes, MD      Allergies    Penicillins, Glipizide, Januvia [sitagliptin], Sulfa antibiotics, Jardiance [empagliflozin], and Scallops [shellfish allergy]    Review of Systems   Review of Systems  Musculoskeletal:  Positive for back pain.    Physical Exam Updated Vital Signs BP (!) 144/88 (BP Location: Left Arm)   Pulse 85   Temp 98 F (36.7 C) (Oral)   Resp 16   Ht 5' 2.5" (1.588 m)   Wt 68.5 kg   SpO2 100%   BMI 27.18 kg/m  Physical Exam Vitals and nursing note reviewed.  Constitutional:      General: She is not in acute distress.    Appearance: She is well-developed. She is not diaphoretic.  HENT:     Head: Normocephalic and atraumatic.     Right Ear: External ear normal.     Left Ear: External ear normal.     Nose: Nose normal.     Mouth/Throat:  Mouth: Mucous membranes are moist.  Eyes:     General: No scleral icterus.    Conjunctiva/sclera: Conjunctivae normal.  Cardiovascular:     Rate and Rhythm: Normal rate and regular rhythm.     Heart sounds: Normal heart sounds. No murmur heard.    No friction rub. No gallop.  Pulmonary:     Effort: Pulmonary effort is normal. No respiratory distress.     Breath sounds: Normal breath sounds.  Abdominal:     General: Bowel sounds are normal. There is no distension.     Palpations: Abdomen is soft. There is no mass.     Tenderness: There is no abdominal tenderness. There is no guarding.  Musculoskeletal:     Cervical back: Normal range of motion.     Comments: Lumbosacral spine area reveals mild tenderness and mild spasm.   Painful and reduced LS ROM noted. Straight leg raise is positive at 30 degrees on left. DTR's, motor strength and sensation normal, including heel and toe gait.  Peripheral pulses are palpable. Hips and knees have full range of motion without pain. No abdominal tenderness, mass or organomegaly.   Skin:    General: Skin is warm and dry.  Neurological:     Mental Status: She is alert and oriented to person, place, and time.  Psychiatric:        Behavior: Behavior normal.     ED Results / Procedures / Treatments   Labs (all labs ordered are listed, but only abnormal results are displayed) Labs Reviewed - No data to display  EKG None  Radiology No results found.  Procedures Procedures    Medications Ordered in ED Medications - No data to display  ED Course/ Medical Decision Making/ A&P                                 Medical Decision Making Amount and/or Complexity of Data Reviewed Radiology: ordered.  Risk Prescription drug management.   Patient here with sciatica symptoms.  Patient will be discharged with anti-inflammatories, muscle relaxer, pain medication.  She is advised to follow closely with her primary care physician and ask for PT eval and treat for sciatica.  Patient given handouts for stretching and home therapy, no red flag symptoms, no hyperreflexia or clonus suggestive of cord compression.  Patient will be discharged and discussed return precaution.        Final Clinical Impression(s) / ED Diagnoses Final diagnoses:  Sciatica of left side    Rx / DC Orders ED Discharge Orders     None         Arthor Captain, PA-C 06/12/23 4098    Wynetta Fines, MD 06/12/23 1136

## 2023-06-12 NOTE — Discharge Instructions (Addendum)
Contact a health care provider if: Your pain is not controlled by medicine. Your pain does not improve or gets worse. Your pain lasts longer than 4 weeks. You have unexplained weight loss. Get help right away if: You are not able to control when you urinate or have bowel movements (incontinence). You have: Weakness in your lower back, pelvis, buttocks, or legs that gets worse. Redness or swelling of your back. A burning sensation when you urinate.

## 2023-06-12 NOTE — ED Triage Notes (Addendum)
 Pt presents to ED from home C/O back pain and numbness radiating down LLE X 1 month. Denies loss of bowel/bladder control.

## 2023-06-22 ENCOUNTER — Other Ambulatory Visit: Payer: Self-pay | Admitting: Nurse Practitioner

## 2023-06-22 DIAGNOSIS — Z1231 Encounter for screening mammogram for malignant neoplasm of breast: Secondary | ICD-10-CM

## 2023-07-13 DIAGNOSIS — M5416 Radiculopathy, lumbar region: Secondary | ICD-10-CM | POA: Insufficient documentation

## 2023-07-13 DIAGNOSIS — M545 Low back pain, unspecified: Secondary | ICD-10-CM | POA: Insufficient documentation

## 2023-07-19 ENCOUNTER — Ambulatory Visit

## 2023-07-20 ENCOUNTER — Emergency Department (HOSPITAL_COMMUNITY)
Admission: EM | Admit: 2023-07-20 | Discharge: 2023-07-20 | Disposition: A | Discharge status: Home / Self Care | Attending: Emergency Medicine | Admitting: Emergency Medicine

## 2023-07-20 ENCOUNTER — Encounter (HOSPITAL_COMMUNITY): Payer: Self-pay

## 2023-07-20 ENCOUNTER — Other Ambulatory Visit: Payer: Self-pay

## 2023-07-20 DIAGNOSIS — Z79899 Other long term (current) drug therapy: Secondary | ICD-10-CM | POA: Diagnosis not present

## 2023-07-20 DIAGNOSIS — M5442 Lumbago with sciatica, left side: Secondary | ICD-10-CM | POA: Insufficient documentation

## 2023-07-20 DIAGNOSIS — I10 Essential (primary) hypertension: Secondary | ICD-10-CM | POA: Diagnosis not present

## 2023-07-20 DIAGNOSIS — Z7984 Long term (current) use of oral hypoglycemic drugs: Secondary | ICD-10-CM | POA: Diagnosis not present

## 2023-07-20 DIAGNOSIS — E119 Type 2 diabetes mellitus without complications: Secondary | ICD-10-CM | POA: Insufficient documentation

## 2023-07-20 DIAGNOSIS — M5432 Sciatica, left side: Secondary | ICD-10-CM

## 2023-07-20 DIAGNOSIS — M545 Low back pain, unspecified: Secondary | ICD-10-CM | POA: Diagnosis present

## 2023-07-20 MED ORDER — LIDOCAINE 5 % EX PTCH
1.0000 | MEDICATED_PATCH | CUTANEOUS | Status: DC
  Administered 2023-07-20: 1 via TRANSDERMAL
  Filled 2023-07-20: qty 1

## 2023-07-20 MED ORDER — OXYCODONE-ACETAMINOPHEN 5-325 MG PO TABS: 1.0000 | ORAL_TABLET | Freq: Three times a day (TID) | ORAL | 0 refills | Status: AC | PRN

## 2023-07-20 MED ORDER — OXYCODONE-ACETAMINOPHEN 5-325 MG PO TABS
1.0000 | ORAL_TABLET | Freq: Once | ORAL | Status: AC
  Administered 2023-07-20: 1 via ORAL
  Filled 2023-07-20: qty 1

## 2023-07-20 MED ORDER — LIDOCAINE 5 % EX PTCH: 1.0000 | MEDICATED_PATCH | CUTANEOUS | 0 refills | Status: AC

## 2023-07-20 NOTE — ED Provider Notes (Signed)
 [place] EMERGENCY DEPARTMENT AT [facility] Provider Note   CSN: [id number] Arrival date & time: 07/20/23  1410     History  Chief Complaint  Patient presents with   Sciatica    Sylvia Conner is a 68 y.o. female.  With a history of sciatica, hypertension and diabetes presents to ED for back pain.  Patient first started to experience left-sided sciatic back pain this January.  She has been seen in the ED for this reason as well as [facility] and there is a plan to start PT next week and obtain an MRI in early May.  Unable to tolerate the pain at home despite the use of Tylenol, celecoxib, and Flexeril.  Pain became severe and she had difficulty walking secondary to pain.  She has not experienced urinary retention, urinary incontinence, fecal incontinence, focal weakness, loss of sensation.  No prior history of spinal surgeries or IV drug use.  Denies fevers chills recent trauma.  HPI     Home Medications Prior to Admission medications   Medication Sig Start Date End Date Taking? Authorizing Provider  lidocaine (LIDODERM) 5 % Place 1 patch onto the skin daily for 15 days. Remove & Discard patch within 12 hours or as directed by MD 07/20/23 08/04/23 Yes Royanne Foots, DO  oxyCODONE-acetaminophen (PERCOCET/ROXICET) 5-325 MG tablet Take 1 tablet by mouth every 8 (eight) hours as needed for up to 3 days for severe pain (pain score 7-10). 07/20/23 07/23/23 Yes Estelle June A, DO  blood glucose meter kit and supplies KIT Dispense based on patient and insurance preference. Use up to four times daily as directed. (FOR E11.9). 02/01/20   Pincus Sanes, MD  cyclobenzaprine (FLEXERIL) 10 MG tablet Take 0.5-1 tablets (5-10 mg total) by mouth 2 (two) times daily as needed for muscle spasms. 06/12/23   Arthor Captain, PA-C  HYDROcodone-acetaminophen (NORCO/VICODIN) 5-325 MG tablet Take 1 tablet by mouth every 4 (four) hours as needed. 05/23/23   Corwin Levins, MD  levocetirizine (XYZAL) 5  MG tablet Take 1 tablet (5 mg total) by mouth every evening. 01/27/22   Pincus Sanes, MD  levothyroxine (SYNTHROID) 125 MCG tablet Take 1 tablet (125 mcg total) by mouth daily. Take 6 days a week.  Take 1/2 tab once a week 02/02/23   Pincus Sanes, MD  meloxicam (MOBIC) 15 MG tablet Take 1 tablet (15 mg total) by mouth daily. 04/18/23   Jacalyn Lefevre, MD  metFORMIN (GLUCOPHAGE) 1000 MG tablet TAKE 1 TABLET BY MOUTH TWICE DAILY WITH A MEAL 08/31/22   Burns, Bobette Mo, MD  Multiple Vitamins-Minerals (HAIR SKIN AND NAILS FORMULA PO) Take 1 tablet by mouth daily.    [provider]  naproxen (NAPROSYN) 375 MG tablet Take 1 tablet (375 mg total) by mouth 2 (two) times daily with a meal. 06/12/23   Harris, Abigail, PA-C  potassium chloride SA (KLOR-CON M) 20 MEQ tablet Take 3 tablets (60 mEq total) by mouth daily. 12/02/21   Pincus Sanes, MD  rosuvastatin (CRESTOR) 20 MG tablet Take 1 tablet (20 mg total) by mouth daily. 01/27/22   Pincus Sanes, MD  Semaglutide, 2 MG/DOSE, 8 MG/3ML SOPN Inject 2 mg as directed once a week. 02/02/23   Pincus Sanes, MD  traMADol (ULTRAM) 50 MG tablet Take 1 tablet (50 mg total) by mouth every 6 (six) hours as needed. 06/12/23   Harris, Abigail, PA-C  triamterene-hydrochlorothiazide (MAXZIDE) 75-50 MG tablet Take 1 tablet by mouth every  morning. 02/02/23   Pincus Sanes, MD      Allergies    Penicillins, Glipizide, Januvia [sitagliptin], Sulfa antibiotics, Jardiance [empagliflozin], and Scallops [shellfish allergy]    Review of Systems   Review of Systems  Physical Exam Updated Vital Signs BP (!) 152/98   Pulse 82   Temp 98.1 F (36.7 C) (Oral)   Resp 15   Ht 5\' 2"  (1.575 m)   Wt 68.5 kg   SpO2 100%   BMI 27.62 kg/m  Physical Exam Vitals and nursing note reviewed.  HENT:     Head: Normocephalic and atraumatic.  Eyes:     Pupils: Pupils are equal, round, and reactive to light.  Cardiovascular:     Rate and Rhythm: Normal rate and regular  rhythm.  Pulmonary:     Effort: Pulmonary effort is normal.     Breath sounds: Normal breath sounds.  Abdominal:     Palpations: Abdomen is soft.     Tenderness: There is no abdominal tenderness.  Musculoskeletal:     Comments: Midline lower lumbar tenderness and left paraspinal lumbar tenderness without step-off deformities No cervical or thoracic tenderness 5 out of 5 motor strength bilateral lower extremities Sensation intact light touch throughout bilateral lower extremities 2+ DTRs bilateral patellar reflexes 2+ DP pulse bilaterally  Skin:    General: Skin is warm and dry.  Neurological:     Mental Status: She is alert.  Psychiatric:        Mood and Affect: Mood normal.     ED Results / Procedures / Treatments   Labs (all labs ordered are listed, but only abnormal results are displayed) Labs Reviewed - No data to display  EKG None  Radiology No results found.  Procedures Procedures    Medications Ordered in ED Medications  lidocaine (LIDODERM) 5 % 1 patch (1 patch Transdermal Patch Applied 07/20/23 1555)  oxyCODONE-acetaminophen (PERCOCET/ROXICET) 5-325 MG per tablet 1 tablet (1 tablet Oral Given 07/20/23 1553)    ED Course/ Medical Decision Making/ A&P Clinical Course as of 07/20/23 1903  Wed Jul 20, 2023  1859 Patient reports significant relief after 1 dose of Percocet and lidocaine patch.  Will prescribe short-term course for severe pain only of Percocet and lidocaine patches.  She will follow-up with her PCP, and physical therapist [MP]    Clinical Course User Index [MP] Royanne Foots, DO                                 Medical Decision Making 68 year old female with history as above returns for left-sided sciatic back pain.  Acute worsening of left-sided back pain from left lower lumbar region down left extremity.  No red flag symptoms that be worrisome for acute spinal cord involvement.  She has outpatient MRI scheduled no need for MRI here in the ED  today.  Will focus on pain control.  Tylenol, celecoxib and Flexeril have been ineffective.  Will provide lidocaine patch, oral opioid analgesic and reassess  Risk Prescription drug management.           Final Clinical Impression(s) / ED Diagnoses Final diagnoses:  Left sided sciatica    Rx / DC Orders ED Discharge Orders          Ordered    oxyCODONE-acetaminophen (PERCOCET/ROXICET) 5-325 MG tablet  Every 8 hours PRN        07/20/23 1902    lidocaine (LIDODERM) 5 %  Every 24 hours        07/20/23 1902              Sallyanne Creamer, DO 07/20/23 1903

## 2023-07-20 NOTE — Discharge Instructions (Signed)
 You were seen in the emergency room for left-sided sciatic back pain Your symptoms improved after 1 dose of Percocet and a lidocaine patch It is important that you follow-up with your primary care doctor discuss further pain management You also need to keep your PT appointment and have the MRI taken as scheduled We have called in a prescription for Percocet to your pharmacy Take this medication as directed for severe pain only Do not drink alcohol or drive will take this medication we have also called in prescription for lidocaine patches for you to use as directed for symptomatic pain relief Return to the emergency department severe pain, if you are unable to walk or unable to move your legs

## 2023-07-20 NOTE — ED Triage Notes (Addendum)
 Pt reports with worsening sciatica stating that she needs an MRI. Pt has pain down her left leg and lower back. Pt reports following [facility] however her appts aren't until next month.

## 2023-07-22 ENCOUNTER — Ambulatory Visit

## 2023-08-12 ENCOUNTER — Encounter (HOSPITAL_COMMUNITY): Payer: Self-pay

## 2023-08-19 ENCOUNTER — Ambulatory Visit
Admission: RE | Admit: 2023-08-19 | Discharge: 2023-08-19 | Disposition: A | Discharge status: Home / Self Care | Source: Ambulatory Visit | Attending: Nurse Practitioner | Admitting: Nurse Practitioner

## 2023-08-19 DIAGNOSIS — Z1231 Encounter for screening mammogram for malignant neoplasm of breast: Secondary | ICD-10-CM

## 2023-08-26 ENCOUNTER — Ambulatory Visit: Admitting: Internal Medicine

## 2023-08-31 NOTE — Progress Notes (Deleted)
    Subjective:    Patient ID: Sylvia Conner, female    DOB: 07-26-1955, 68 y.o.   MRN: [id number]      HPI Sylvia Conner is here for No chief complaint on file.   Sciatica, left side -   ED 06/12/23 - left lower back pain radiating to left posterior leg to foot.  Worse with movement, better with rest.  Received toradol  and dexamethasone .  Prescribed flexeril , naproxen  and tramadol .   07/13/2023 -  saw [facility], MRI ordered, referred to PT.  Prescribed celebrex.  Was to f/u with Dr Sylvia Conner.   ED 07/20/23 - given oxycodone .   Prescribed lidocaine  patches and percocet.   07/29/23 - [facility] - PT Weight loss -      Medications and allergies reviewed with patient and updated if appropriate.  Current Outpatient Medications on File Prior to Visit  Medication Sig Dispense Refill   blood glucose meter kit and supplies KIT Dispense based on patient and insurance preference. Use up to four times daily as directed. (FOR E11.9). 1 each 0   cyclobenzaprine  (FLEXERIL ) 10 MG tablet Take 0.5-1 tablets (5-10 mg total) by mouth 2 (two) times daily as needed for muscle spasms. 20 tablet 0   HYDROcodone -acetaminophen  (NORCO/VICODIN) 5-325 MG tablet Take 1 tablet by mouth every 4 (four) hours as needed. 10 tablet 0   levocetirizine (XYZAL ) 5 MG tablet Take 1 tablet (5 mg total) by mouth every evening. 30 tablet 5   levothyroxine  (SYNTHROID ) 125 MCG tablet Take 1 tablet (125 mcg total) by mouth daily. Take 6 days a week.  Take 1/2 tab once a week 90 tablet 1   meloxicam  (MOBIC ) 15 MG tablet Take 1 tablet (15 mg total) by mouth daily. 30 tablet 0   metFORMIN  (GLUCOPHAGE ) 1000 MG tablet TAKE 1 TABLET BY MOUTH TWICE DAILY WITH A MEAL 180 tablet 0   Multiple Vitamins-Minerals (HAIR SKIN AND NAILS FORMULA PO) Take 1 tablet by mouth daily.     naproxen  (NAPROSYN ) 375 MG tablet Take 1 tablet (375 mg total) by mouth 2 (two) times daily with a meal. 20 tablet 0   potassium chloride  SA (KLOR-CON  M) 20 MEQ tablet  Take 3 tablets (60 mEq total) by mouth daily. 270 tablet 1   rosuvastatin  (CRESTOR ) 20 MG tablet Take 1 tablet (20 mg total) by mouth daily. 90 tablet 3   Semaglutide , 2 MG/DOSE, 8 MG/3ML SOPN Inject 2 mg as directed once a week. 9 mL 1   traMADol  (ULTRAM ) 50 MG tablet Take 1 tablet (50 mg total) by mouth every 6 (six) hours as needed. 15 tablet 0   triamterene -hydrochlorothiazide (MAXZIDE) 75-50 MG tablet Take 1 tablet by mouth every morning. 90 tablet 1   No current facility-administered medications on file prior to visit.    Review of Systems     Objective:  There were no vitals filed for this visit. BP Readings from Last 3 Encounters:  07/20/23 (!) 152/98  06/12/23 (!) 144/88  04/18/23 (!) 147/80   Wt Readings from Last 3 Encounters:  07/20/23 151 lb 0.2 oz (68.5 kg)  06/12/23 151 lb (68.5 kg)  04/18/23 148 lb (67.1 kg)   There is no height or weight on file to calculate BMI.    Physical Exam         Assessment & Plan:    See Problem List for Assessment and Plan of chronic medical problems.

## 2023-09-01 ENCOUNTER — Ambulatory Visit: Admitting: Internal Medicine

## 2023-09-12 DIAGNOSIS — M48061 Spinal stenosis, lumbar region without neurogenic claudication: Secondary | ICD-10-CM | POA: Insufficient documentation

## 2023-11-27 ENCOUNTER — Other Ambulatory Visit: Payer: Self-pay

## 2023-11-27 ENCOUNTER — Encounter (HOSPITAL_COMMUNITY): Payer: Self-pay

## 2023-11-27 ENCOUNTER — Emergency Department (HOSPITAL_COMMUNITY)
Admission: EM | Admit: 2023-11-27 | Discharge: 2023-11-27 | Disposition: A | Discharge status: Home / Self Care | Source: Ambulatory Visit | Attending: Emergency Medicine | Admitting: Emergency Medicine

## 2023-11-27 DIAGNOSIS — Z7984 Long term (current) use of oral hypoglycemic drugs: Secondary | ICD-10-CM | POA: Insufficient documentation

## 2023-11-27 DIAGNOSIS — E1165 Type 2 diabetes mellitus with hyperglycemia: Secondary | ICD-10-CM | POA: Diagnosis present

## 2023-11-27 DIAGNOSIS — R739 Hyperglycemia, unspecified: Secondary | ICD-10-CM

## 2023-11-27 DIAGNOSIS — R197 Diarrhea, unspecified: Secondary | ICD-10-CM | POA: Diagnosis not present

## 2023-11-27 LAB — CBC WITH DIFFERENTIAL/PLATELET
Abs Immature Granulocytes: 0.02 K/uL (ref 0.00–0.07)
Basophils Absolute: 0 K/uL (ref 0.0–0.1)
Basophils Relative: 0 %
Eosinophils Absolute: 0 K/uL (ref 0.0–0.5)
Eosinophils Relative: 0 %
HCT: 34.2 % — ABNORMAL LOW (ref 36.0–46.0)
Hemoglobin: 10.8 g/dL — ABNORMAL LOW (ref 12.0–15.0)
Immature Granulocytes: 0 %
Lymphocytes Relative: 9 %
Lymphs Abs: 0.7 K/uL (ref 0.7–4.0)
MCH: 25.2 pg — ABNORMAL LOW (ref 26.0–34.0)
MCHC: 31.6 g/dL (ref 30.0–36.0)
MCV: 79.9 fL — ABNORMAL LOW (ref 80.0–100.0)
Monocytes Absolute: 0.1 K/uL (ref 0.1–1.0)
Monocytes Relative: 2 %
Neutro Abs: 7.4 K/uL (ref 1.7–7.7)
Neutrophils Relative %: 89 %
Platelets: 256 K/uL (ref 150–400)
RBC: 4.28 MIL/uL (ref 3.87–5.11)
RDW: 13.4 % (ref 11.5–15.5)
WBC: 8.3 K/uL (ref 4.0–10.5)
nRBC: 0 % (ref 0.0–0.2)

## 2023-11-27 LAB — URINALYSIS, ROUTINE W REFLEX MICROSCOPIC
Bacteria, UA: NONE SEEN
Bilirubin Urine: NEGATIVE
Glucose, UA: >=500 mg/dL — AB
Hgb urine dipstick: NEGATIVE
Ketones, ur: 5 mg/dL — AB
Leukocytes,Ua: NEGATIVE
Nitrite: NEGATIVE
Protein, ur: NEGATIVE mg/dL
Specific Gravity, Urine: 1.018 (ref 1.005–1.030)
pH: 7 (ref 5.0–8.0)

## 2023-11-27 LAB — BLOOD GAS, VENOUS
Acid-Base Excess: 3.7 mmol/L — ABNORMAL HIGH (ref 0.0–2.0)
Bicarbonate: 28.5 mmol/L — ABNORMAL HIGH (ref 20.0–28.0)
O2 Saturation: 67.5 %
Patient temperature: 37
pCO2, Ven: 43 mmHg — ABNORMAL LOW (ref 44–60)
pH, Ven: 7.43 (ref 7.25–7.43)
pO2, Ven: 37 mmHg (ref 32–45)

## 2023-11-27 LAB — BASIC METABOLIC PANEL WITH GFR
Anion gap: 11 (ref 5–15)
BUN: 22 mg/dL (ref 8–23)
CO2: 21 mmol/L — ABNORMAL LOW (ref 22–32)
Calcium: 9 mg/dL (ref 8.9–10.3)
Chloride: 98 mmol/L (ref 98–111)
Creatinine, Ser: 0.92 mg/dL (ref 0.44–1.00)
GFR, Estimated: >60 mL/min (ref >60)
Glucose, Bld: 342 mg/dL — ABNORMAL HIGH (ref 70–99)
Potassium: 3.8 mmol/L (ref 3.5–5.1)
Sodium: 130 mmol/L — ABNORMAL LOW (ref 135–145)

## 2023-11-27 LAB — CBG MONITORING, ED
Glucose-Capillary: 333 mg/dL — ABNORMAL HIGH (ref 70–99)
Glucose-Capillary: 261 mg/dL — ABNORMAL HIGH (ref 70–99)

## 2023-11-27 MED ORDER — INSULIN ASPART 100 UNIT/ML IJ SOLN
5.0000 [IU] | Freq: Once | INTRAMUSCULAR | Status: AC
  Administered 2023-11-27: 5 [IU] via SUBCUTANEOUS
  Filled 2023-11-27: qty 0.05

## 2023-11-27 MED ORDER — SODIUM CHLORIDE 0.9 % IV BOLUS
1000.0000 mL | Freq: Once | INTRAVENOUS | Status: AC
  Administered 2023-11-27: 1000 mL via INTRAVENOUS

## 2023-11-27 NOTE — Discharge Instructions (Signed)
 You were seen in the emergency department for elevated blood sugar blurry vision increased thirst and urination.  Your blood sugar was elevated but there was no evidence of significant complications.  You were given medications and fluid with improvement in your blood sugar.  Please continue your regular medications and monitor your blood sugar.  Follow up with your primary care doctor.  Return to the emergency department if any worsening or concerning symptoms

## 2023-11-27 NOTE — ED Triage Notes (Signed)
 Pt states her blood sugar has been running high, 488mg /dl, pt was having blurred vision and aching in her legs. Pt called her doctor and she advised to come to the hospital for evaluation.

## 2023-11-27 NOTE — ED Provider Notes (Signed)
  [place] [facility] AT [facility] Provider Note   CSN: [id number] Arrival date & time: 11/27/23  1846     Patient presents with: Hyperglycemia   Sylvia Conner is a 68 y.o. female.  She has a history of diabetes and is on metformin  and semaglutide .  She said her blood sugars are usually 190-215.  Today blood sugar was over 400.  She has been having frequent urination and is very thirsty.  Blurry vision.  Called her primary care doctor who recommended she come here for further evaluation.  She is also had some diarrhea today.  Recent exacerbation of some sciatica.  She said she is not taking prednisone  though.  {Add pertinent medical, surgical, social history, OB history to YEP:67052} The history is provided by the patient.  Hyperglycemia Blood sugar level PTA:  400 Severity:  Moderate Onset quality:  Sudden Duration:  1 day Timing:  Constant Progression:  Unchanged Chronicity:  New Diabetes status:  Controlled with oral medications Relieved by:  Nothing Associated symptoms: blurred vision, dehydration, increased thirst and polyuria   Associated symptoms: no abdominal pain, no chest pain, no dysuria, no fever and no vomiting        Prior to Admission medications   Medication Sig Start Date End Date Taking? Authorizing Provider  blood glucose meter kit and supplies KIT Dispense based on patient and insurance preference. Use up to four times daily as directed. (FOR E11.9). 02/01/20   Geofm Glade PARAS, MD  cyclobenzaprine  (FLEXERIL ) 10 MG tablet Take 0.5-1 tablets (5-10 mg total) by mouth 2 (two) times daily as needed for muscle spasms. 06/12/23   Harris, Abigail, PA-C  HYDROcodone -acetaminophen  (NORCO/VICODIN) 5-325 MG tablet Take 1 tablet by mouth every 4 (four) hours as needed. 05/23/23   Norleen Lynwood ORN, MD  levocetirizine (XYZAL ) 5 MG tablet Take 1 tablet (5 mg total) by mouth every evening. 01/27/22   Geofm Glade PARAS, MD  levothyroxine  (SYNTHROID ) 125 MCG tablet  Take 1 tablet (125 mcg total) by mouth daily. Take 6 days a week.  Take 1/2 tab once a week 02/02/23   Geofm Glade PARAS, MD  meloxicam  (MOBIC ) 15 MG tablet Take 1 tablet (15 mg total) by mouth daily. 04/18/23   Dean Clarity, MD  metFORMIN  (GLUCOPHAGE ) 1000 MG tablet TAKE 1 TABLET BY MOUTH TWICE DAILY WITH A MEAL 08/31/22   Burns, Glade PARAS, MD  Multiple Vitamins-Minerals (HAIR SKIN AND NAILS FORMULA PO) Take 1 tablet by mouth daily.    [provider]  naproxen  (NAPROSYN ) 375 MG tablet Take 1 tablet (375 mg total) by mouth 2 (two) times daily with a meal. 06/12/23   Harris, Abigail, PA-C  potassium chloride  SA (KLOR-CON  M) 20 MEQ tablet Take 3 tablets (60 mEq total) by mouth daily. 12/02/21   Geofm Glade PARAS, MD  rosuvastatin  (CRESTOR ) 20 MG tablet Take 1 tablet (20 mg total) by mouth daily. 01/27/22   Geofm Glade PARAS, MD  Semaglutide , 2 MG/DOSE, 8 MG/3ML SOPN Inject 2 mg as directed once a week. 02/02/23   Geofm Glade PARAS, MD  traMADol  (ULTRAM ) 50 MG tablet Take 1 tablet (50 mg total) by mouth every 6 (six) hours as needed. 06/12/23   Harris, Abigail, PA-C  triamterene -hydrochlorothiazide (MAXZIDE) 75-50 MG tablet Take 1 tablet by mouth every morning. 02/02/23   Geofm Glade PARAS, MD    Allergies: Penicillins, Glipizide , Januvia  [sitagliptin ], Sulfa antibiotics, Jardiance  [empagliflozin ], and Scallops [shellfish allergy]    Review of Systems  Constitutional:  Negative for  fever.  Eyes:  Positive for blurred vision and visual disturbance.  Cardiovascular:  Negative for chest pain.  Gastrointestinal:  Positive for diarrhea. Negative for abdominal pain and vomiting.  Endocrine: Positive for polydipsia and polyuria.  Genitourinary:  Negative for dysuria.  Musculoskeletal:  Positive for back pain.    Updated Vital Signs BP (!) 159/93 (BP Location: Right Arm)   Pulse 99   Temp 98.3 F (36.8 C) (Oral)   Resp 18   SpO2 99%   Physical Exam Vitals and nursing note reviewed.  Constitutional:       General: She is not in acute distress.    Appearance: Normal appearance. She is well-developed.  HENT:     Head: Normocephalic and atraumatic.  Eyes:     Conjunctiva/sclera: Conjunctivae normal.  Cardiovascular:     Rate and Rhythm: Normal rate and regular rhythm.     Heart sounds: No murmur heard. Pulmonary:     Effort: Pulmonary effort is normal. No respiratory distress.     Breath sounds: Normal breath sounds. No stridor. No wheezing.  Abdominal:     Palpations: Abdomen is soft.     Tenderness: There is no abdominal tenderness. There is no guarding or rebound.  Musculoskeletal:        General: No deformity.     Cervical back: Neck supple.  Skin:    General: Skin is warm and dry.  Neurological:     General: No focal deficit present.     Mental Status: She is alert.     GCS: GCS eye subscore is 4. GCS verbal subscore is 5. GCS motor subscore is 6.     (all labs ordered are listed, but only abnormal results are displayed) Labs Reviewed  BASIC METABOLIC PANEL WITH GFR  CBC WITH DIFFERENTIAL/PLATELET  CBG MONITORING, ED    EKG: None  Radiology: No results found.  {Document cardiac monitor, telemetry assessment procedure when appropriate:32947} Procedures   Medications Ordered in the ED  sodium chloride  0.9 % bolus 1,000 mL (has no administration in time range)      {Click here for ABCD2, HEART and other calculators REFRESH Note before signing:1}                              Medical Decision Making Amount and/or Complexity of Data Reviewed Labs: ordered.   This patient complains of ***; this involves an extensive number of treatment Options and is a complaint that carries with it a high risk of complications and morbidity. The differential includes ***  I ordered, reviewed and interpreted labs, which included *** I ordered medication *** and reviewed PMP when indicated. I ordered imaging studies which included *** and I independently    visualized and  interpreted imaging which showed *** Additional history obtained from *** Previous records obtained and reviewed *** I consulted *** and discussed lab and imaging findings and discussed disposition.  Cardiac monitoring reviewed, *** Social determinants considered, *** Critical Interventions: ***  After the interventions stated above, I reevaluated the patient and found *** Admission and further testing considered, ***   {Document critical care time when appropriate  Document review of labs and clinical decision tools ie CHADS2VASC2, etc  Document your independent review of radiology images and any outside records  Document your discussion with family members, caretakers and with consultants  Document social determinants of health affecting pt's care  Document your decision making why or why not admission,  treatments were needed:32947:::1}   Final diagnoses:  None    ED Discharge Orders     None

## 2023-12-02 LAB — HM DIABETES EYE EXAM

## 2024-01-06 ENCOUNTER — Encounter: Payer: Self-pay | Admitting: Family

## 2024-01-06 ENCOUNTER — Ambulatory Visit: Admitting: Family

## 2024-01-06 ENCOUNTER — Other Ambulatory Visit: Payer: Self-pay | Admitting: Family

## 2024-01-06 VITALS — BP 122/80 | HR 85 | Temp 98.2°F | Ht 62.0 in | Wt 134.0 lb

## 2024-01-06 DIAGNOSIS — E038 Other specified hypothyroidism: Secondary | ICD-10-CM | POA: Diagnosis not present

## 2024-01-06 DIAGNOSIS — E785 Hyperlipidemia, unspecified: Secondary | ICD-10-CM | POA: Diagnosis not present

## 2024-01-06 DIAGNOSIS — Z794 Long term (current) use of insulin: Secondary | ICD-10-CM

## 2024-01-06 DIAGNOSIS — E876 Hypokalemia: Secondary | ICD-10-CM | POA: Diagnosis not present

## 2024-01-06 DIAGNOSIS — E039 Hypothyroidism, unspecified: Secondary | ICD-10-CM

## 2024-01-06 DIAGNOSIS — E1165 Type 2 diabetes mellitus with hyperglycemia: Secondary | ICD-10-CM | POA: Diagnosis not present

## 2024-01-06 DIAGNOSIS — M545 Low back pain, unspecified: Secondary | ICD-10-CM

## 2024-01-06 DIAGNOSIS — G8929 Other chronic pain: Secondary | ICD-10-CM

## 2024-01-06 LAB — CBC WITH DIFFERENTIAL/PLATELET
Basophils Absolute: 0 K/uL (ref 0.0–0.1)
Basophils Relative: 1.1 % (ref 0.0–3.0)
Eosinophils Absolute: 0.1 K/uL (ref 0.0–0.7)
Eosinophils Relative: 2.2 % (ref 0.0–5.0)
HCT: 35.1 % — ABNORMAL LOW (ref 36.0–46.0)
Hemoglobin: 11.3 g/dL — ABNORMAL LOW (ref 12.0–15.0)
Lymphocytes Relative: 38 % (ref 12.0–46.0)
Lymphs Abs: 1.3 K/uL (ref 0.7–4.0)
MCHC: 32.1 g/dL (ref 30.0–36.0)
MCV: 76.4 fl — ABNORMAL LOW (ref 78.0–100.0)
Monocytes Absolute: 0.2 K/uL (ref 0.1–1.0)
Monocytes Relative: 7.5 % (ref 3.0–12.0)
Neutro Abs: 1.7 K/uL (ref 1.4–7.7)
Neutrophils Relative %: 51.2 % (ref 43.0–77.0)
Platelets: 296 K/uL (ref 150.0–400.0)
RBC: 4.6 Mil/uL (ref 3.87–5.11)
RDW: 13.7 % (ref 11.5–15.5)
WBC: 3.3 K/uL — ABNORMAL LOW (ref 4.0–10.5)

## 2024-01-06 LAB — HEMOGLOBIN A1C: Hgb A1c MFr Bld: 8.2 % — ABNORMAL HIGH (ref 4.6–6.5)

## 2024-01-06 LAB — LIPID PANEL
Cholesterol: 187 mg/dL (ref 0–200)
HDL: 68.9 mg/dL (ref >39.00)
LDL Cholesterol: 107 mg/dL — ABNORMAL HIGH (ref 0–99)
NonHDL: 117.64
Total CHOL/HDL Ratio: 3
Triglycerides: 53 mg/dL (ref 0.0–149.0)
VLDL: 10.6 mg/dL (ref 0.0–40.0)

## 2024-01-06 LAB — COMPREHENSIVE METABOLIC PANEL WITH GFR
ALT: 13 U/L (ref 0–35)
AST: 20 U/L (ref 0–37)
Albumin: 4.6 g/dL (ref 3.5–5.2)
Alkaline Phosphatase: 66 U/L (ref 39–117)
BUN: 11 mg/dL (ref 6–23)
CO2: 27 meq/L (ref 19–32)
Calcium: 9.9 mg/dL (ref 8.4–10.5)
Chloride: 98 meq/L (ref 96–112)
Creatinine, Ser: 0.73 mg/dL (ref 0.40–1.20)
GFR: 85.44 mL/min (ref >60.00)
Glucose, Bld: 144 mg/dL — ABNORMAL HIGH (ref 70–99)
Potassium: 3.3 meq/L — ABNORMAL LOW (ref 3.5–5.1)
Sodium: 136 meq/L (ref 135–145)
Total Bilirubin: 0.6 mg/dL (ref 0.2–1.2)
Total Protein: 7.5 g/dL (ref 6.0–8.3)

## 2024-01-06 LAB — POCT GLYCOSYLATED HEMOGLOBIN (HGB A1C): Hemoglobin A1C: 7.6 % — AB (ref 4.0–5.6)

## 2024-01-06 LAB — TSH: TSH: 8.15 u[IU]/mL — ABNORMAL HIGH (ref 0.35–5.50)

## 2024-01-06 MED ORDER — TRIAMTERENE-HCTZ 75-50 MG PO TABS: 1.0000 | ORAL_TABLET | Freq: Every morning | ORAL | 1 refills | Status: AC

## 2024-01-06 NOTE — Progress Notes (Signed)
 Acute Office Visit  Subjective:     Patient ID: Sylvia Conner, female    DOB: 30-Jun-1955, 68 y.o.   MRN: [id number]  Chief Complaint  Patient presents with  . Medical Management of Chronic Issues    A1C check    HPI Patient is in today for for management of her chronic conditions.  She has a history of type 2 diabetes, hypothyroidism, hyperlipidemia, and chronic low back pain.  She is here to have her A1c checked in preparation for surgery to her lumbar spine.  Reports she is doing well.  Eating better.  No exercise.  However, her weight is reduced from 153 down to 133 intentionally.  Last diabetic eye exam was September 2025.  She does not currently have a primary care provider.  However, she would like to get established here.  Review of Systems  Musculoskeletal:  Positive for back pain.       Chronic low back pain  Psychiatric/Behavioral: Negative.    All other systems reviewed and are negative.   Past Medical History:  Diagnosis Date  . Anemia   . Arthritis   . Diabetes mellitus   . Hypertension   . Thyroid  disease   . UTI (lower urinary tract infection)     Social History   Socioeconomic History  . Marital status: Married    Spouse name: Not on file  . Number of children: Not on file  . Years of education: Not on file  . Highest education level: Not on file  Occupational History  . Not on file  Tobacco Use  . Smoking status: Never  . Smokeless tobacco: Never  Vaping Use  . Vaping status: Never Used  Substance and Sexual Activity  . Alcohol use: No  . Drug use: No  . Sexual activity: Not on file  Other Topics Concern  . Not on file  Social History Narrative  . Not on file   Social Drivers of Health   Financial Resource Strain: Not on file  Food Insecurity: No Food Insecurity (10/28/2021)   Hunger Vital Sign   . Worried About Programme researcher, broadcasting/film/video in the Last Year: Never true   . Ran Out of Food in the Last Year: Never true  Transportation Needs: No  Transportation Needs (10/28/2021)   PRAPARE - Transportation   . Lack of Transportation (Medical): No   . Lack of Transportation (Non-Medical): No  Physical Activity: Not on file  Stress: Not on file  Social Connections: Unknown (08/17/2021)   Received from [facility]   Social Network   . Social Network: Not on file  Intimate Partner Violence: Unknown (07/07/2021)   Received from [facility]   HITS   . Physically Hurt: Not on file   . Insult or Talk Down To: Not on file   . Threaten Physical Harm: Not on file   . Scream or Curse: Not on file    Past Surgical History:  Procedure Laterality Date  . CESAREAN SECTION    . COLONOSCOPY    . CYST EXCISION Right 07/28/2021   Procedure: EXCISION SEBACEOUS CYST RIGHT AXILLA;  Surgeon: Belinda Cough, MD;  Location: [facility];  Service: General;  Laterality: Right;  . LIPOMA EXCISION Right 07/28/2021   Procedure: EXCISION OF SUBCUTANEOUS LIPOMA RIGHT UPPER ARM, LIPOMA RIGHT CHEST WALL;  Surgeon: Belinda Cough, MD;  Location: [facility];  Service: General;  Laterality: Right;  . PARTIAL HYSTERECTOMY     1995  Family History  Problem Relation Age of Onset  . Hypertension Mother   . Diabetes Mother   . Diabetes Sister   . Diabetes Paternal Grandmother   . Diabetes Other   . Healthy Daughter   . Healthy Daughter     Allergies  Allergen Reactions  . Penicillins Anaphylaxis and Rash    Has patient had a PCN reaction causing immediate rash, facial/tongue/throat swelling, SOB or lightheadedness with hypotension: Yes Has patient had a PCN reaction causing severe rash involving mucus membranes or skin necrosis: No Has patient had a PCN reaction that required hospitalization Yes Has patient had a PCN reaction occurring within the last 10 years: No If all of the above answers are NO, then may proceed with Cephalosporin use.   . Glipizide      Breakout on arms  . Januvia  [Sitagliptin ] Nausea Only  .  Sulfa Antibiotics Swelling  . Jardiance  [Empagliflozin ] Rash  . Scallops [Shellfish Allergy] Hives    Current Outpatient Medications on File Prior to Visit  Medication Sig Dispense Refill  . blood glucose meter kit and supplies KIT Dispense based on patient and insurance preference. Use up to four times daily as directed. (FOR E11.9). 1 each 0  . cyclobenzaprine  (FLEXERIL ) 10 MG tablet Take 0.5-1 tablets (5-10 mg total) by mouth 2 (two) times daily as needed for muscle spasms. 20 tablet 0  . HYDROcodone -acetaminophen  (NORCO/VICODIN) 5-325 MG tablet Take 1 tablet by mouth every 4 (four) hours as needed. 10 tablet 0  . levocetirizine (XYZAL ) 5 MG tablet Take 1 tablet (5 mg total) by mouth every evening. 30 tablet 5  . levothyroxine  (SYNTHROID ) 125 MCG tablet Take 1 tablet (125 mcg total) by mouth daily. Take 6 days a week.  Take 1/2 tab once a week 90 tablet 1  . meloxicam  (MOBIC ) 15 MG tablet Take 1 tablet (15 mg total) by mouth daily. 30 tablet 0  . metFORMIN  (GLUCOPHAGE ) 1000 MG tablet TAKE 1 TABLET BY MOUTH TWICE DAILY WITH A MEAL 180 tablet 0  . Multiple Vitamins-Minerals (HAIR SKIN AND NAILS FORMULA PO) Take 1 tablet by mouth daily.    . naproxen  (NAPROSYN ) 375 MG tablet Take 1 tablet (375 mg total) by mouth 2 (two) times daily with a meal. 20 tablet 0  . oxyCODONE -acetaminophen  (PERCOCET) 10-325 MG tablet Take 1 tablet 3 times a day by oral route as needed for 5 days.    . pioglitazone  (ACTOS ) 30 MG tablet Oral    . potassium chloride  SA (KLOR-CON  M) 20 MEQ tablet Take 3 tablets (60 mEq total) by mouth daily. 270 tablet 1  . rosuvastatin  (CRESTOR ) 20 MG tablet Take 1 tablet (20 mg total) by mouth daily. 90 tablet 3  . Semaglutide , 2 MG/DOSE, 8 MG/3ML SOPN Inject 2 mg as directed once a week. 9 mL 1  . senna-docusate (SENOKOT-S) 8.6-50 MG tablet Oral    . traMADol  (ULTRAM ) 50 MG tablet Take 1 tablet (50 mg total) by mouth every 6 (six) hours as needed. 15 tablet 0  .  triamterene -hydrochlorothiazide (MAXZIDE) 75-50 MG tablet Take 1 tablet by mouth every morning. 90 tablet 1   No current facility-administered medications on file prior to visit.    BP 122/80 (BP Location: Left Arm, Patient Position: Sitting, Cuff Size: Normal)   Pulse 85   Temp 98.2 F (36.8 C) (Oral)   Ht 5' 2 (1.575 m)   Wt 134 lb (60.8 kg)   SpO2 98%   BMI 24.51 kg/m chart  Objective:    BP 122/80 (BP Location: Left Arm, Patient Position: Sitting, Cuff Size: Normal)   Pulse 85   Temp 98.2 F (36.8 C) (Oral)   Ht 5' 2 (1.575 m)   Wt 134 lb (60.8 kg)   SpO2 98%   BMI 24.51 kg/m    Physical Exam Vitals and nursing note reviewed.  Constitutional:      Appearance: Normal appearance. She is normal weight.  Eyes:     Pupils: Pupils are equal, round, and reactive to light.  Cardiovascular:     Rate and Rhythm: Normal rate and regular rhythm.     Pulses: Normal pulses.     Heart sounds: Normal heart sounds.  Pulmonary:     Effort: Pulmonary effort is normal.     Breath sounds: Normal breath sounds.  Abdominal:     General: Abdomen is flat. Bowel sounds are normal.     Palpations: Abdomen is soft. There is no mass.     Tenderness: There is no abdominal tenderness. There is no guarding or rebound.  Musculoskeletal:        General: Normal range of motion.  Skin:    General: Skin is warm and dry.  Neurological:     General: No focal deficit present.     Mental Status: She is alert and oriented to person, place, and time. Mental status is at baseline.  Psychiatric:        Mood and Affect: Mood normal.        Behavior: Behavior normal.        Thought Content: Thought content normal.        Judgment: Judgment normal.    Results for orders placed or performed in visit on 01/06/24  POCT HgB A1C  Result Value Ref Range   Hemoglobin A1C 7.6 (A) 4.0 - 5.6 %   HbA1c POC (<> result, manual entry)     HbA1c, POC (prediabetic range)     HbA1c, POC (controlled diabetic  range)          Assessment & Plan:   Problem List Items Addressed This Visit     Low back pain   Relevant Medications   oxyCODONE -acetaminophen  (PERCOCET) 10-325 MG tablet   Hypothyroidism   Relevant Orders   TSH   CBC w/Diff   Hypokalemia   Relevant Orders   CBC w/Diff   Comp Met (CMET)   Hyperlipidemia - Primary   Relevant Orders   CBC w/Diff   Lipid panel   Other Visit Diagnoses       Uncontrolled type 2 diabetes mellitus with hyperglycemia, without long-term current use of insulin  (HCC)       Relevant Medications   pioglitazone  (ACTOS ) 30 MG tablet   Other Relevant Orders   CBC w/Diff   Urine Microalbumin w/creat. ratio   Comp Met (CMET)   POCT HgB A1C (Completed)      Call the office if symptoms worsen or persist.  Will send A1c results to [facility].  Establish with a primary care provider at this office.  Recheck as scheduled and sooner as needed.   No orders of the defined types were placed in this encounter.   No follow-ups on file.  Jahnai Slingerland B Jaidynn Balster, FNP

## 2024-01-09 ENCOUNTER — Ambulatory Visit: Payer: Self-pay | Admitting: Family

## 2024-01-09 ENCOUNTER — Other Ambulatory Visit: Payer: Self-pay | Admitting: Family

## 2024-01-09 MED ORDER — LEVOTHYROXINE SODIUM 137 MCG PO TABS: 137.0000 ug | ORAL_TABLET | Freq: Every day | ORAL | 3 refills | Status: AC

## 2024-01-09 MED ORDER — POTASSIUM CHLORIDE CRYS ER 20 MEQ PO TBCR: 20.0000 meq | EXTENDED_RELEASE_TABLET | Freq: Every day | ORAL | 0 refills | Status: DC

## 2024-01-13 ENCOUNTER — Telehealth: Payer: Self-pay

## 2024-01-13 NOTE — Telephone Encounter (Signed)
 Copied from CRM [id number]. Topic: Clinical - Lab/Test Results >> Jan 13, 2024  1:04 PM Sylvia Conner wrote: Reason for CRM: Patient is calling again regarding her test results A1C info that she needs so she can schedule her appointment  with her back surgeon. Fax number is [phone]

## 2024-01-16 ENCOUNTER — Telehealth: Payer: Self-pay

## 2024-01-16 NOTE — Telephone Encounter (Unsigned)
 Copied from CRM [id number]. Topic: Clinical - Medical Advice >> Jan 16, 2024 10:26 AM Burnard DEL wrote: Reason for CRM: pharmacy called in regards to medication potassium chloride  SA (KLOR-CON  M) 20 MEQ tablet. Pharmacy stated that this patient is on a lot of blood pressure medications including triamterene -hydrochlorothiazide (MAXZIDE) 75-50 MG tablet. And with the potassium and this medication it could increase patients potassium levels.They would like to know if her levels are okay for her to be taking both of these medications.Please advised.

## 2024-01-23 NOTE — Telephone Encounter (Signed)
 Pt called back in stating [facility] stil has not received the fax for documents and pt is waiting for these documents to be scheduled.   Pls follow up with pt [phone]

## 2024-01-23 NOTE — Telephone Encounter (Unsigned)
 Copied from CRM [id number]. Topic: Clinical - Prescription Issue >> Jan 19, 2024  1:14 PM Paige D wrote: Reason for CRM: Ry calling from [facility] pharmacy in regards to pt being on potassium chloride  SA (KLOR-CON  M) 20 MEQ tablet also on triamterene -hydrochlorothiazide (MAXZIDE) 75-50 MG tablet  Pharmacy is calling to verify that pt is suppsoe to be taking both of these medication together. Please reach out to Ry at [facility] pharmacy to confirm this is accurate [phone]

## 2024-01-25 ENCOUNTER — Telehealth: Payer: Self-pay

## 2024-01-25 NOTE — Telephone Encounter (Signed)
 I sent this back to call center as an error but they insist patient is current with Dr. Geofm. If that is not the case, please let me know and I will rout this back to them.  Copied from CRM [id number]. Topic: General - Other >> Jan 25, 2024  9:18 AM Eva FALCON wrote: Reason for CRM: Pt is having back surgery, and says [facility] is sending a fax requesting her blood sugars. States she needs that sent over in order to get her surgery scheduled. Please be on look out and advise if the fax was received. >> Jan 25, 2024  1:50 PM Hamdi H wrote: This patient is established at [facility], PCP was [employer]. I'm not sure why PCP was changed on patient's chart, but the patient is still seen at [facility] for acute issues. I will route this CRM back to [facility].

## 2024-01-26 NOTE — Telephone Encounter (Signed)
This has been addressed in previous message.

## 2024-01-26 NOTE — Telephone Encounter (Signed)
 Thie is the response that I got back from [facility]:  Copied from CRM #[id number]. Topic: General - Other >> Jan 25, 2024  9:18 AM Eva FALCON wrote: Reason for CRM: Pt is having back surgery, and says [facility] is sending a fax requesting her blood sugars. States she needs that sent over in order to get her surgery scheduled. Please be on look out and advise if the fax was received. >> Jan 26, 2024  1:07 PM Zane F wrote: After further investigation of error reported, it appears no error has been made. The patient is simply requesting her A1C labs be refaxed to [facility]. Please refax lab results as requested by patient and contact patient to ensure she knows it has been completed.    >> Jan 25, 2024  1:50 PM Hamdi H wrote: This patient is established at [facility], PCP was [employer]. I'm not sure why PCP was changed on patient's chart, but the patient is still seen at [facility] for acute issues. I will route this CRM back to [facility].

## 2024-02-01 NOTE — Telephone Encounter (Unsigned)
 Copied from CRM [id number]. Topic: Clinical - Lab/Test Results >> Feb 01, 2024  9:55 AM Laymon HERO wrote: Reason for CRM: [facility]- Lori direct fax # [phone]- please resend the A1C lab

## 2024-02-01 NOTE — Telephone Encounter (Signed)
 This has since been re-faxed to fax number provided

## 2024-02-09 ENCOUNTER — Ambulatory Visit (HOSPITAL_COMMUNITY): Payer: Self-pay | Admitting: Physician Assistant

## 2024-03-20 NOTE — Pre-Procedure Instructions (Signed)
 Surgical Instructions   Your procedure is scheduled on April 02, 2024. Report to [facility] Main Entrance A at 9:30 A.M., then check in with the Admitting office. Any questions or running late day of surgery: call [phone]  Questions prior to your surgery date: call [phone], Monday-Friday, 8am-4pm. If you experience any cold or flu symptoms such as cough, fever, chills, shortness of breath, etc. between now and your scheduled surgery, please notify us  at the above number.     Remember:  Do not eat after midnight the night before your surgery  You may drink clear liquids until 8:30 AM the morning of your surgery.   Clear liquids allowed are: Water, Non-Citrus Juices (without pulp), Carbonated Beverages, Clear Tea (no milk, honey, etc.), Black Coffee Only (NO MILK, CREAM OR POWDERED CREAMER of any kind), and Gatorade.    Take these medicines the morning of surgery with A SIP OF WATER: levothyroxine  (SYNTHROID )  rosuvastatin  (CRESTOR )    May take these medicines IF NEEDED: cyclobenzaprine  (FLEXERIL )  HYDROcodone -acetaminophen  (NORCO/VICODIN) oxyCODONE -acetaminophen  (PERCOCET)   traMADol  (ULTRAM )    One week prior to surgery, STOP taking any Aspirin (unless otherwise instructed by your surgeon) Aleve , Naproxen , Ibuprofen , Motrin , Advil , Goody's, BC's, all herbal medications, fish oil, and non-prescription vitamins. This includes your medication: meloxicam  (MOBIC )    WHAT DO I DO ABOUT MY DIABETES MEDICATION?   Do not take metFORMIN  (GLUCOPHAGE ) or pioglitazone  (ACTOS ) the morning of surgery.  STOP taking your Semaglutide  one week prior to surgery. Do not take any doses after December 21st.      HOW TO MANAGE YOUR DIABETES BEFORE AND AFTER SURGERY  Why is it important to control my blood sugar before and after surgery? Improving blood sugar levels before and after surgery helps healing and can limit problems. A way of improving blood sugar control is eating a  healthy diet by:  Eating less sugar and carbohydrates  Increasing activity/exercise  Talking with your doctor about reaching your blood sugar goals High blood sugars (greater than 180 mg/dL) can raise your risk of infections and slow your recovery, so you will need to focus on controlling your diabetes during the weeks before surgery. Make sure that the doctor who takes care of your diabetes knows about your planned surgery including the date and location.  How do I manage my blood sugar before surgery? Check your blood sugar at least 4 times a day, starting 2 days before surgery, to make sure that the level is not too high or low.  Check your blood sugar the morning of your surgery when you wake up and every 2 hours until you get to the Short Stay unit.  If your blood sugar is less than 70 mg/dL, you will need to treat for low blood sugar: Do not take insulin . Treat a low blood sugar (less than 70 mg/dL) with  cup of clear juice (cranberry or apple), 4 glucose tablets, OR glucose gel. Recheck blood sugar in 15 minutes after treatment (to make sure it is greater than 70 mg/dL). If your blood sugar is not greater than 70 mg/dL on recheck, call [phone] for further instructions. Report your blood sugar to the short stay nurse when you get to Short Stay.  If you are admitted to the hospital after surgery: Your blood sugar will be checked by the staff and you will probably be given insulin  after surgery (instead of oral diabetes medicines) to make sure you have good blood sugar levels. The goal for blood sugar  control after surgery is 80-180 mg/dL.                      Do NOT Smoke (Tobacco/Vaping) for 24 hours prior to your procedure.  If you use a CPAP at night, you may bring your mask/headgear for your overnight stay.   You will be asked to remove any contacts, glasses, piercing's, hearing aid's, dentures/partials prior to surgery. Please bring cases for these items if needed.     Patients discharged the day of surgery will not be allowed to drive home, and someone needs to stay with them for 24 hours.  SURGICAL WAITING ROOM VISITATION Patients may have no more than 2 support people in the waiting area - these visitors may rotate.   Pre-op nurse will coordinate an appropriate time for 1 ADULT support person, who may not rotate, to accompany patient in pre-op.  Children under the age of 66 must have an adult with them who is not the patient and must remain in the main waiting area with an adult.  If the patient needs to stay at the hospital during part of their recovery, the visitor guidelines for inpatient rooms apply.  Please refer to the [facility] website for the visitor guidelines for any additional information.   If you received a COVID test during your pre-op visit  it is requested that you wear a mask when out in public, stay away from anyone that may not be feeling well and notify your surgeon if you develop symptoms. If you have been in contact with anyone that has tested positive in the last 10 days please notify you surgeon.      Pre-operative 4 CHG Bathing Instructions   You can play a key role in reducing the risk of infection after surgery. Your skin needs to be as free of germs as possible. You can reduce the number of germs on your skin by washing with CHG (chlorhexidine  gluconate) soap before surgery. CHG is an antiseptic soap that kills germs and continues to kill germs even after washing.   DO NOT use if you have an allergy to chlorhexidine /CHG or antibacterial soaps. If your skin becomes reddened or irritated, stop using the CHG and notify one of our RNs at [phone].   Please shower with the CHG soap starting 4 days before surgery using the following schedule:     Please keep in mind the following:  DO NOT shave, including legs and underarms, starting the day of your first shower.   You may shave your face at any point before/day of  surgery.  Place clean sheets on your bed the day you start using CHG soap. Use a clean washcloth (not used since being washed) for each shower. DO NOT sleep with pets once you start using the CHG.   CHG Shower Instructions:  Wash your face and private area with normal soap. If you choose to wash your hair, wash first with your normal shampoo.  After you use shampoo/soap, rinse your hair and body thoroughly to remove shampoo/soap residue.  Turn the water OFF and apply  bottle of CHG soap to a CLEAN washcloth.  Apply CHG soap ONLY FROM YOUR NECK DOWN TO YOUR TOES (washing for 3-5 minutes)  DO NOT use CHG soap on face, private areas, open wounds, or sores.  Pay special attention to the area where your surgery is being performed.  If you are having back surgery, having someone wash your back for you  may be helpful. Wait 2 minutes after CHG soap is applied, then you may rinse off the CHG soap.  Pat dry with a clean towel  Put on clean clothes/pajamas   If you choose to wear lotion, please use ONLY the CHG-compatible lotions that are listed below.  Additional instructions for the day of surgery:  If you choose, you may shower the morning of surgery with an antibacterial soap.  DO NOT APPLY any lotions, deodorants, cologne, or perfumes.   Do not bring valuables to the hospital. [facility] is not responsible for any belongings/valuables. Do not wear nail polish, gel polish, artificial nails, or any other type of covering on natural nails (fingers and toes) Do not wear jewelry or makeup Put on clean/comfortable clothes.  Please brush your teeth.  Ask your nurse before applying any prescription medications to the skin.     CHG Compatible Lotions   Aveeno Moisturizing lotion  Cetaphil Moisturizing Cream  Cetaphil Moisturizing Lotion  Clairol Herbal Essence Moisturizing Lotion, Dry Skin  Clairol Herbal Essence Moisturizing Lotion, Extra Dry Skin  Clairol Herbal Essence Moisturizing  Lotion, Normal Skin  Curel Age Defying Therapeutic Moisturizing Lotion with Alpha Hydroxy  Curel Extreme Care Body Lotion  Curel Soothing Hands Moisturizing Hand Lotion  Curel Therapeutic Moisturizing Cream, Fragrance-Free  Curel Therapeutic Moisturizing Lotion, Fragrance-Free  Curel Therapeutic Moisturizing Lotion, Original Formula  Eucerin Daily Replenishing Lotion  Eucerin Dry Skin Therapy Plus Alpha Hydroxy Crme  Eucerin Dry Skin Therapy Plus Alpha Hydroxy Lotion  Eucerin Original Crme  Eucerin Original Lotion  Eucerin Plus Crme Eucerin Plus Lotion  Eucerin TriLipid Replenishing Lotion  Keri Anti-Bacterial Hand Lotion  Keri Deep Conditioning Original Lotion Dry Skin Formula Softly Scented  Keri Deep Conditioning Original Lotion, Fragrance Free Sensitive Skin Formula  Keri Lotion Fast Absorbing Fragrance Free Sensitive Skin Formula  Keri Lotion Fast Absorbing Softly Scented Dry Skin Formula  Keri Original Lotion  Keri Skin Renewal Lotion Keri Silky Smooth Lotion  Keri Silky Smooth Sensitive Skin Lotion  Nivea Body Creamy Conditioning Oil  Nivea Body Extra Enriched Lotion  Nivea Body Original Lotion  Nivea Body Sheer Moisturizing Lotion Nivea Crme  Nivea Skin Firming Lotion  NutraDerm 30 Skin Lotion  NutraDerm Skin Lotion  NutraDerm Therapeutic Skin Cream  NutraDerm Therapeutic Skin Lotion  ProShield Protective Hand Cream  Provon moisturizing lotion  Please read over the following fact sheets that you were given.

## 2024-03-21 ENCOUNTER — Other Ambulatory Visit: Payer: Self-pay

## 2024-03-21 ENCOUNTER — Ambulatory Visit (HOSPITAL_COMMUNITY): Payer: Self-pay | Admitting: Physician Assistant

## 2024-03-21 ENCOUNTER — Encounter (HOSPITAL_COMMUNITY)
Admission: RE | Admit: 2024-03-21 | Discharge: 2024-03-21 | Disposition: A | Discharge status: Home / Self Care | Source: Ambulatory Visit | Attending: Orthopedic Surgery | Admitting: Orthopedic Surgery

## 2024-03-21 ENCOUNTER — Encounter (HOSPITAL_COMMUNITY): Payer: Self-pay

## 2024-03-21 VITALS — BP 131/69 | HR 84 | Temp 98.5°F | Resp 18 | Ht 62.0 in | Wt 131.6 lb

## 2024-03-21 DIAGNOSIS — I493 Ventricular premature depolarization: Secondary | ICD-10-CM | POA: Insufficient documentation

## 2024-03-21 DIAGNOSIS — Z01818 Encounter for other preprocedural examination: Secondary | ICD-10-CM | POA: Diagnosis present

## 2024-03-21 DIAGNOSIS — I440 Atrioventricular block, first degree: Secondary | ICD-10-CM | POA: Diagnosis not present

## 2024-03-21 HISTORY — DX: Hypothyroidism, unspecified: E03.9

## 2024-03-21 LAB — CBC
HCT: 37 % (ref 36.0–46.0)
Hemoglobin: 11.6 g/dL — ABNORMAL LOW (ref 12.0–15.0)
MCH: 24.8 pg — ABNORMAL LOW (ref 26.0–34.0)
MCHC: 31.4 g/dL (ref 30.0–36.0)
MCV: 79.1 fL — ABNORMAL LOW (ref 80.0–100.0)
Platelets: 295 K/uL (ref 150–400)
RBC: 4.68 MIL/uL (ref 3.87–5.11)
RDW: 13.2 % (ref 11.5–15.5)
WBC: 4.2 K/uL (ref 4.0–10.5)
nRBC: 0 % (ref 0.0–0.2)

## 2024-03-21 LAB — BASIC METABOLIC PANEL WITH GFR
Anion gap: 10 (ref 5–15)
BUN: 15 mg/dL (ref 8–23)
CO2: 27 mmol/L (ref 22–32)
Calcium: 9.6 mg/dL (ref 8.9–10.3)
Chloride: 101 mmol/L (ref 98–111)
Creatinine, Ser: 0.74 mg/dL (ref 0.44–1.00)
GFR, Estimated: >60 mL/min (ref >60)
Glucose, Bld: 168 mg/dL — ABNORMAL HIGH (ref 70–99)
Potassium: 4 mmol/L (ref 3.5–5.1)
Sodium: 138 mmol/L (ref 135–145)

## 2024-03-21 LAB — NO BLOOD PRODUCTS

## 2024-03-21 LAB — SURGICAL PCR SCREEN
MRSA, PCR: NEGATIVE
Staphylococcus aureus: NEGATIVE

## 2024-03-21 LAB — GLUCOSE, CAPILLARY: Glucose-Capillary: 181 mg/dL — ABNORMAL HIGH (ref 70–99)

## 2024-03-21 LAB — HEMOGLOBIN A1C
Hgb A1c MFr Bld: 8 % — ABNORMAL HIGH (ref 4.8–5.6)
Mean Plasma Glucose: 182.9 mg/dL

## 2024-03-21 NOTE — Progress Notes (Signed)
 Surgical Instructions     Your procedure is scheduled on April 02, 2024. Report to [facility] Main Entrance A at 9:30 A.M., then check in with the Admitting office. Any questions or running late day of surgery: call [phone]   Questions prior to your surgery date: call [phone], Monday-Friday, 8am-4pm. If you experience any cold or flu symptoms such as cough, fever, chills, shortness of breath, etc. between now and your scheduled surgery, please notify us  at the above number.            Remember:       Do not eat after midnight the night before your surgery   You may drink clear liquids until 8:30 AM the morning of your surgery.   Clear liquids allowed are: Water, Non-Citrus Juices (without pulp), Carbonated Beverages, Clear Tea (no milk, honey, etc.), Black Coffee Only (NO MILK, CREAM OR POWDERED CREAMER of any kind), and Gatorade.          Take these medicines the morning of surgery with A SIP OF WATER: levothyroxine  (SYNTHROID )      May take these medicines IF NEEDED: Acetaminophen  (Tylenol ) oxyCODONE -acetaminophen  (PERCOCET)        One week prior to surgery, STOP taking any Aspirin (unless otherwise instructed by your surgeon) Aleve , Naproxen , Ibuprofen , Motrin , Advil , Goody's, BC's, all herbal medications, fish oil, and non-prescription vitamins. This includes your medication: meloxicam  (MOBIC )      WHAT DO I DO ABOUT MY DIABETES MEDICATION?     Do not take metFORMIN  (GLUCOPHAGE ) the morning of surgery.   STOP taking your Ozempic  one week prior to surgery. Do not take any doses after December 21st.                              HOW TO MANAGE YOUR DIABETES BEFORE AND AFTER SURGERY   Why is it important to control my blood sugar before and after surgery? Improving blood sugar levels before and after surgery helps healing and can limit problems. A way of improving blood sugar control is eating a healthy diet by:  Eating less sugar and carbohydrates   Increasing activity/exercise  Talking with your doctor about reaching your blood sugar goals High blood sugars (greater than 180 mg/dL) can raise your risk of infections and slow your recovery, so you will need to focus on controlling your diabetes during the weeks before surgery. Make sure that the doctor who takes care of your diabetes knows about your planned surgery including the date and location.   How do I manage my blood sugar before surgery? Check your blood sugar at least 4 times a day, starting 2 days before surgery, to make sure that the level is not too high or low.   Check your blood sugar the morning of your surgery when you wake up and every 2 hours until you get to the Short Stay unit.   If your blood sugar is less than 70 mg/dL, you will need to treat for low blood sugar: Do not take insulin . Treat a low blood sugar (less than 70 mg/dL) with  cup of clear juice (cranberry or apple), 4 glucose tablets, OR glucose gel. Recheck blood sugar in 15 minutes after treatment (to make sure it is greater than 70 mg/dL). If your blood sugar is not greater than 70 mg/dL on recheck, call [phone] for further instructions. Report your blood sugar to the short stay nurse when you get to Short  Stay.   If you are admitted to the hospital after surgery: Your blood sugar will be checked by the staff and you will probably be given insulin  after surgery (instead of oral diabetes medicines) to make sure you have good blood sugar levels. The goal for blood sugar control after surgery is 80-180 mg/dL.                       Do NOT Smoke (Tobacco/Vaping) for 24 hours prior to your procedure.   If you use a CPAP at night, you may bring your mask/headgear for your overnight stay.   You will be asked to remove any contacts, glasses, piercing's, hearing aid's, dentures/partials prior to surgery. Please bring cases for these items if needed.    Patients discharged the day of surgery will not be  allowed to drive home, and someone needs to stay with them for 24 hours.   SURGICAL WAITING ROOM VISITATION Patients may have no more than 2 support people in the waiting area - these visitors may rotate.   Pre-op nurse will coordinate an appropriate time for 1 ADULT support person, who may not rotate, to accompany patient in pre-op.  Children under the age of 44 must have an adult with them who is not the patient and must remain in the main waiting area with an adult.   If the patient needs to stay at the hospital during part of their recovery, the visitor guidelines for inpatient rooms apply.   Please refer to the [facility] website for the visitor guidelines for any additional information.     If you received a COVID test during your pre-op visit  it is requested that you wear a mask when out in public, stay away from anyone that may not be feeling well and notify your surgeon if you develop symptoms. If you have been in contact with anyone that has tested positive in the last 10 days please notify you surgeon.         Pre-operative 4 CHG Bathing Instructions    You can play a key role in reducing the risk of infection after surgery. Your skin needs to be as free of germs as possible. You can reduce the number of germs on your skin by washing with CHG (chlorhexidine  gluconate) soap before surgery. CHG is an antiseptic soap that kills germs and continues to kill germs even after washing.    DO NOT use if you have an allergy to chlorhexidine /CHG or antibacterial soaps. If your skin becomes reddened or irritated, stop using the CHG and notify one of our RNs at [phone].    Please shower with the CHG soap starting 4 days before surgery using the following schedule:       Please keep in mind the following:  DO NOT shave, including legs and underarms, starting the day of your first shower.   You may shave your face at any point before/day of surgery.  Place clean sheets on your bed  the day you start using CHG soap. Use a clean washcloth (not used since being washed) for each shower. DO NOT sleep with pets once you start using the CHG.    CHG Shower Instructions:  Wash your face and private area with normal soap. If you choose to wash your hair, wash first with your normal shampoo.  After you use shampoo/soap, rinse your hair and body thoroughly to remove shampoo/soap residue.  Turn the water OFF and apply  bottle of CHG soap to a CLEAN washcloth.  Apply CHG soap ONLY FROM YOUR NECK DOWN TO YOUR TOES (washing for 3-5 minutes)  DO NOT use CHG soap on face, private areas, open wounds, or sores.  Pay special attention to the area where your surgery is being performed.  If you are having back surgery, having someone wash your back for you may be helpful. Wait 2 minutes after CHG soap is applied, then you may rinse off the CHG soap.  Pat dry with a clean towel  Put on clean clothes/pajamas   If you choose to wear lotion, please use ONLY the CHG-compatible lotions that are listed below.   Additional instructions for the day of surgery:   If you choose, you may shower the morning of surgery with an antibacterial soap.  DO NOT APPLY any lotions, deodorants, cologne, or perfumes.   Do not bring valuables to the hospital. [facility] is not responsible for any belongings/valuables. Do not wear nail polish, gel polish, artificial nails, or any other type of covering on natural nails (fingers and toes) Do not wear jewelry or makeup Put on clean/comfortable clothes.  Please brush your teeth.  Ask your nurse before applying any prescription medications to the skin.        CHG Compatible Lotions    Aveeno Moisturizing lotion  Cetaphil Moisturizing Cream  Cetaphil Moisturizing Lotion  Clairol Herbal Essence Moisturizing Lotion, Dry Skin  Clairol Herbal Essence Moisturizing Lotion, Extra Dry Skin  Clairol Herbal Essence Moisturizing Lotion, Normal Skin  Curel Age Defying  Therapeutic Moisturizing Lotion with Alpha Hydroxy  Curel Extreme Care Body Lotion  Curel Soothing Hands Moisturizing Hand Lotion  Curel Therapeutic Moisturizing Cream, Fragrance-Free  Curel Therapeutic Moisturizing Lotion, Fragrance-Free  Curel Therapeutic Moisturizing Lotion, Original Formula  Eucerin Daily Replenishing Lotion  Eucerin Dry Skin Therapy Plus Alpha Hydroxy Crme  Eucerin Dry Skin Therapy Plus Alpha Hydroxy Lotion  Eucerin Original Crme  Eucerin Original Lotion  Eucerin Plus Crme Eucerin Plus Lotion  Eucerin TriLipid Replenishing Lotion  Keri Anti-Bacterial Hand Lotion  Keri Deep Conditioning Original Lotion Dry Skin Formula Softly Scented  Keri Deep Conditioning Original Lotion, Fragrance Free Sensitive Skin Formula  Keri Lotion Fast Absorbing Fragrance Free Sensitive Skin Formula  Keri Lotion Fast Absorbing Softly Scented Dry Skin Formula  Keri Original Lotion  Keri Skin Renewal Lotion Keri Silky Smooth Lotion  Keri Silky Smooth Sensitive Skin Lotion  Nivea Body Creamy Conditioning Oil  Nivea Body Extra Enriched Lotion  Nivea Body Original Lotion  Nivea Body Sheer Moisturizing Lotion Nivea Crme  Nivea Skin Firming Lotion  NutraDerm 30 Skin Lotion  NutraDerm Skin Lotion  NutraDerm Therapeutic Skin Cream  NutraDerm Therapeutic Skin Lotion  ProShield Protective Hand Cream  Provon moisturizing lotion   Please read over the following fact sheets that you were given.

## 2024-03-21 NOTE — Progress Notes (Addendum)
 PCP - Rojelio Daring at [facility]   - requested LOV, labs, EKG Cardiologist - denies  PPM/ICD - denies Device Orders - n/a Rep Notified -  n/a  Chest x-ray - denies EKG - 03/21/24 Stress Test - denies ECHO - denies Cardiac Cath - denies  Sleep Study - denies CPAP - n/a  Fasting Blood Sugar - 160-180 Checks Blood Sugar a couple times a week  Last dose of GLP1 agonist-  last dose of Ozempic  will on 12/18 - patient aware not to take  a dose after that GLP1 instructions: n/a  Blood Thinner Instructions: n/a Aspirin Instructions: n/a  ERAS Protcol - clears until 0830 PRE-SURGERY Ensure or G2- n/a  COVID TEST- n/a   Anesthesia review: yes - A1C  Patient denies shortness of breath, fever, cough and chest pain at PAT appointment   All instructions explained to the patient, with a verbal understanding of the material. Patient agrees to go over the instructions while at home for a better understanding. Patient also instructed to self quarantine after being tested for COVID-19. The opportunity to ask questions was provided.  Patient stated that she refuses blood products.  Blood refusal faxed to blood bank and left message at Dr. Burnetta' office.  Patient states that she might change her mind before surgery and she was instructed to let the nurse know on the day of surgery if she does.

## 2024-03-22 NOTE — Progress Notes (Signed)
 Anesthesia Chart Review:  Case: [id number] Date/Time: 05/07/24 0715   Procedure: POSTERIOR LUMBAR FUSION 2 LEVEL - post lumbar decmpression and fusion L4-5, L5-S1   Anesthesia type: General   Diagnosis: Lumbar radiculopathy [M54.16]   Pre-op diagnosis: Lumbar radiculopathy   [facility] / [facility]   Surgeons: Burnetta Aures, MD       DISCUSSION: Sylvia Conner is a 68 year old female scheduled for the above procedure.   History includes never smoker, HTN, DM2, hypothyroidism, anemia.  She had PAT on 03/21/2024 for surgery scheduled for 04/02/2024; However, now surgery is scheduled for 05/07/2024. Her A1c was 8.0% (previously 8.2 on 01/06/2024), so I did leave a message for Dr. Burnetta' scheduler Katheryn and routed labs to [facility] for his review. Current DM medications include metformin  100 mg BID, Ozempic  weekly.   EKG showed SR, 1st degree A-V block, occasional PAC.She denied chest pain and SOB per PAT interview. She is not currently taking Crestor  per her preference. BP 131/69.  Last office note from [facility] requested but pending.   Will need updated labs prior to surgery since rescheduled for > 30 days from currently labs. Of note, she signed a blood refusal form but may reconsider between now and her surgery. H/H were stable at 11.6/37.0 on 03/21/2024.    VS:  Wt Readings from Last 3 Encounters:  03/21/24 59.7 kg  01/06/24 60.8 kg  07/20/23 68.5 kg   BP Readings from Last 3 Encounters:  03/21/24 131/69  01/06/24 122/80  11/27/23 121/70   Pulse Readings from Last 3 Encounters:  03/21/24 84  01/06/24 85  11/27/23 98     PROVIDERS: Delores Rojelio Caldron, NP is PCP [facility]). She was previously seen by [facility].    LABS: Labs from 03/21/2024 PAT visit included: Lab Results  Component Value Date   WBC 4.2 03/21/2024   HGB 11.6 (L) 03/21/2024   HCT 37.0 03/21/2024   PLT 295 03/21/2024   GLUCOSE 168 (H) 03/21/2024   CHOL 187  01/06/2024   TRIG 53.0 01/06/2024   HDL 68.90 01/06/2024   LDLCALC 107 (H) 01/06/2024   ALT 13 01/06/2024   AST 20 01/06/2024   NA 138 03/21/2024   K 4.0 03/21/2024   CL 101 03/21/2024   CREATININE 0.74 03/21/2024   BUN 15 03/21/2024   CO2 27 03/21/2024   TSH 8.15 (H) 01/06/2024   HGBA1C 8.0 (H) 03/21/2024  - Levothyroixine was increased to 137 mcg daily on 01/10/2024 in response to 01/06/2024 TSH results of 8.15.   EKG: 03/21/2024: Sinus rhythm with 1st degree A-V block with occasional Premature ventricular complexes Otherwise normal ECG When compared with ECG of 28-Oct-2020 13:20, PR interval has lengthened Confirmed by Wendel Haws ([id number]) on 03/21/2024 10:30:40 A - PR 218 ms   CV: N/A  Past Medical History:  Diagnosis Date   Anemia    Arthritis    Diabetes mellitus    Hypertension    Hypothyroidism    Thyroid  disease    UTI (lower urinary tract infection)     Past Surgical History:  Procedure Laterality Date   CESAREAN SECTION     COLONOSCOPY     CYST EXCISION Right 07/28/2021   Procedure: EXCISION SEBACEOUS CYST RIGHT AXILLA;  Surgeon: Belinda Cough, MD;  Location: [facility];  Service: General;  Laterality: Right;   LIPOMA EXCISION Right 07/28/2021   Procedure: EXCISION OF SUBCUTANEOUS LIPOMA RIGHT UPPER ARM, LIPOMA RIGHT  CHEST WALL;  Surgeon: Belinda Cough, MD;  Location: [facility];  Service: General;  Laterality: Right;   PARTIAL HYSTERECTOMY     1995    MEDICATIONS:  acetaminophen  (TYLENOL ) 500 MG tablet   levothyroxine  (SYNTHROID ) 137 MCG tablet   metFORMIN  (GLUCOPHAGE ) 1000 MG tablet   Multiple Vitamins-Minerals (HAIR SKIN AND NAILS FORMULA PO)   oxyCODONE -acetaminophen  (PERCOCET) 10-325 MG tablet   OZEMPIC , 0.25 OR 0.5 MG/DOSE, 2 MG/3ML SOPN   triamterene -hydrochlorothiazide (MAXZIDE) 75-50 MG tablet   blood glucose meter kit and supplies KIT   cyclobenzaprine  (FLEXERIL ) 10 MG tablet   HYDROcodone -acetaminophen   (NORCO/VICODIN) 5-325 MG tablet   levocetirizine (XYZAL ) 5 MG tablet   meloxicam  (MOBIC ) 15 MG tablet   naproxen  (NAPROSYN ) 375 MG tablet   potassium chloride  SA (KLOR-CON  M) 20 MEQ tablet   rosuvastatin  (CRESTOR ) 20 MG tablet   Semaglutide , 2 MG/DOSE, 8 MG/3ML SOPN   traMADol  (ULTRAM ) 50 MG tablet    Isaiah Ruder, PA-C Surgical Short Stay/Anesthesiology [facility] Phone [phone] [facility] Phone [phone] 03/22/2024 1:03 PM

## 2024-05-03 NOTE — Progress Notes (Signed)
 Surgical Instructions   Your procedure is scheduled on Monday May 07, 2024. Report to [facility] Main Entrance A at 5:30 A.M., then check in with the Admitting office. Any questions or running late day of surgery: call [phone]  Questions prior to your surgery date: call [phone], Monday-Friday, 8am-4pm. If you experience any cold or flu symptoms such as cough, fever, chills, shortness of breath, etc. between now and your scheduled surgery, please notify us  at the above number.     Remember:  Do not eat after midnight the night before your surgery  You may drink clear liquids until 4:30 the morning of your surgery.   Clear liquids allowed are: Water, Non-Citrus Juices (without pulp), Carbonated Beverages, Clear Tea (no milk, honey, etc.), Black Coffee Only (NO MILK, CREAM OR POWDERED CREAMER of any kind), and Gatorade.    Take these medicines the morning of surgery with A SIP OF WATER  levothyroxine  (SYNTHROID )   May take these medicines IF NEEDED: acetaminophen  (TYLENOL )  oxyCODONE -acetaminophen  (PERCOCET)   One week prior to surgery, STOP taking any Aspirin (unless otherwise instructed by your surgeon) Aleve , Naproxen , Ibuprofen , Motrin , Advil , Goody's, BC's, all herbal medications, fish oil, and non-prescription vitamins.  WHAT DO I DO ABOUT MY DIABETES MEDICATION?   Do not take oral diabetes medicines (pills) the morning of surgery.        DO NOT TAKE YOUR metFORMIN  (GLUCOPHAGE ) THE MORNING OF SURGERY         DO NOT TAKE YOUR OZEMPIC  7 DAYS PRIOR TO SURGERY WITH THE LAST DOSE BEING NO LATER THAN 04/29/2024.    The day of surgery, do not take other diabetes injectables, including Byetta (exenatide), Bydureon (exenatide ER), Victoza (liraglutide), or Trulicity  (dulaglutide ).  If your CBG is greater than 220 mg/dL, you may take  of your sliding scale (correction) dose of insulin .   HOW TO MANAGE YOUR DIABETES BEFORE AND AFTER SURGERY  Why is it important to  control my blood sugar before and after surgery? Improving blood sugar levels before and after surgery helps healing and can limit problems. A way of improving blood sugar control is eating a healthy diet by:  Eating less sugar and carbohydrates  Increasing activity/exercise  Talking with your doctor about reaching your blood sugar goals High blood sugars (greater than 180 mg/dL) can raise your risk of infections and slow your recovery, so you will need to focus on controlling your diabetes during the weeks before surgery. Make sure that the doctor who takes care of your diabetes knows about your planned surgery including the date and location.  How do I manage my blood sugar before surgery? Check your blood sugar at least 4 times a day, starting 2 days before surgery, to make sure that the level is not too high or low.  Check your blood sugar the morning of your surgery when you wake up and every 2 hours until you get to the Short Stay unit.  If your blood sugar is less than 70 mg/dL, you will need to treat for low blood sugar: Do not take insulin . Treat a low blood sugar (less than 70 mg/dL) with  cup of clear juice (cranberry or apple), 4 glucose tablets, OR glucose gel. Recheck blood sugar in 15 minutes after treatment (to make sure it is greater than 70 mg/dL). If your blood sugar is not greater than 70 mg/dL on recheck, call [phone] for further instructions. Report your blood sugar to the short stay nurse when you get to  Short Stay.  If you are admitted to the hospital after surgery: Your blood sugar will be checked by the staff and you will probably be given insulin  after surgery (instead of oral diabetes medicines) to make sure you have good blood sugar levels. The goal for blood sugar control after surgery is 80-180 mg/dL.                      Do NOT Smoke (Tobacco/Vaping) for 24 hours prior to your procedure.  If you use a CPAP at night, you may bring your mask/headgear  for your overnight stay.   You will be asked to remove any contacts, glasses, piercing's, hearing aid's, dentures/partials prior to surgery. Please bring cases for these items if needed.    Your surgeon will determine if you are to be admitted or discharged the same day.  Patients discharged the day of surgery will not be allowed to drive home, and someone needs to stay with them for 24 hours.  SURGICAL WAITING ROOM VISITATION Patients may have no more than 2 support people in the waiting area - these visitors may rotate.   Pre-op nurse will coordinate an appropriate time for 2 ADULT support persons, who may not rotate, to accompany patient in pre-op.  Children under the age of 72 must have an adult with them who is not the patient and must remain in the main waiting area with an adult.  If the patient needs to stay at the hospital during part of their recovery, the visitor guidelines for inpatient rooms apply.  Please refer to the [facility] website for the visitor guidelines for any additional information.   If you received a COVID test during your pre-op visit  it is requested that you wear a mask when out in public, stay away from anyone that may not be feeling well and notify your surgeon if you develop symptoms. If you have been in contact with anyone that has tested positive in the last 10 days please notify you surgeon.      Pre-operative 4 CHG Bathing Instructions   You can play a key role in reducing the risk of infection after surgery. Your skin needs to be as free of germs as possible. You can reduce the number of germs on your skin by washing with CHG (chlorhexidine  gluconate) soap before surgery. CHG is an antiseptic soap that kills germs and continues to kill germs even after washing.   DO NOT use if you have an allergy to chlorhexidine /CHG or antibacterial soaps. If your skin becomes reddened or irritated, stop using the CHG and notify one of our RNs at [phone].    Please shower with the CHG soap starting 4 days before surgery using the following schedule:     Please keep in mind the following:  DO NOT shave, including legs and underarms, starting the day of your first shower.   Place clean sheets on your bed the day you start using CHG soap. Use a clean washcloth (not used since being washed) for each shower. DO NOT sleep with pets once you start using the CHG.   CHG Shower Instructions:  Wash your face and private area with normal soap. If you choose to wash your hair, wash first with your normal shampoo.  After you use shampoo/soap, rinse your hair and body thoroughly to remove shampoo/soap residue.  Turn the water OFF and apply  bottle of CHG soap to a CLEAN washcloth.  Apply CHG soap  ONLY FROM YOUR NECK DOWN TO YOUR TOES (washing for 3-5 minutes)  DO NOT use CHG soap on face, private areas, open wounds, or sores.  Pay special attention to the area where your surgery is being performed.  If you are having back surgery, having someone wash your back for you may be helpful. Wait 2 minutes after CHG soap is applied, then you may rinse off the CHG soap.  Pat dry with a clean towel  Put on clean clothes/pajamas   If you choose to wear lotion, please use ONLY the CHG-compatible lotions that are listed below.  Additional instructions for the day of surgery:  If you choose, you may shower the morning of surgery with an antibacterial soap.  DO NOT APPLY any lotions, deodorants or perfumes.   Do not bring valuables to the hospital. [facility] is not responsible for any belongings/valuables. Do not wear nail polish, gel polish, artificial nails, or any other type of covering on natural nails (fingers and toes) Do not wear jewelry or makeup Put on clean/comfortable clothes.  Please brush your teeth.  Ask your nurse before applying any prescription medications to the skin.     CHG Compatible Lotions   Aveeno Moisturizing lotion  Cetaphil  Moisturizing Cream  Cetaphil Moisturizing Lotion  Clairol Herbal Essence Moisturizing Lotion, Dry Skin  Clairol Herbal Essence Moisturizing Lotion, Extra Dry Skin  Clairol Herbal Essence Moisturizing Lotion, Normal Skin  Curel Age Defying Therapeutic Moisturizing Lotion with Alpha Hydroxy  Curel Extreme Care Body Lotion  Curel Soothing Hands Moisturizing Hand Lotion  Curel Therapeutic Moisturizing Cream, Fragrance-Free  Curel Therapeutic Moisturizing Lotion, Fragrance-Free  Curel Therapeutic Moisturizing Lotion, Original Formula  Eucerin Daily Replenishing Lotion  Eucerin Dry Skin Therapy Plus Alpha Hydroxy Crme  Eucerin Dry Skin Therapy Plus Alpha Hydroxy Lotion  Eucerin Original Crme  Eucerin Original Lotion  Eucerin Plus Crme Eucerin Plus Lotion  Eucerin TriLipid Replenishing Lotion  Keri Anti-Bacterial Hand Lotion  Keri Deep Conditioning Original Lotion Dry Skin Formula Softly Scented  Keri Deep Conditioning Original Lotion, Fragrance Free Sensitive Skin Formula  Keri Lotion Fast Absorbing Fragrance Free Sensitive Skin Formula  Keri Lotion Fast Absorbing Softly Scented Dry Skin Formula  Keri Original Lotion  Keri Skin Renewal Lotion Keri Silky Smooth Lotion  Keri Silky Smooth Sensitive Skin Lotion  Nivea Body Creamy Conditioning Oil  Nivea Body Extra Enriched Lotion  Nivea Body Original Lotion  Nivea Body Sheer Moisturizing Lotion Nivea Crme  Nivea Skin Firming Lotion  NutraDerm 30 Skin Lotion  NutraDerm Skin Lotion  NutraDerm Therapeutic Skin Cream  NutraDerm Therapeutic Skin Lotion  ProShield Protective Hand Cream  Provon moisturizing lotion  Please read over the following fact sheets that you were given.

## 2024-05-04 ENCOUNTER — Encounter (HOSPITAL_COMMUNITY)
Admission: RE | Admit: 2024-05-04 | Discharge: 2024-05-04 | Disposition: A | Source: Ambulatory Visit | Attending: Orthopedic Surgery

## 2024-05-04 ENCOUNTER — Other Ambulatory Visit: Payer: Self-pay

## 2024-05-04 ENCOUNTER — Encounter (HOSPITAL_COMMUNITY): Payer: Self-pay

## 2024-05-04 VITALS — BP 131/76 | HR 87 | Temp 98.3°F | Resp 16 | Ht 62.0 in | Wt 132.2 lb

## 2024-05-04 DIAGNOSIS — Z7985 Long-term (current) use of injectable non-insulin antidiabetic drugs: Secondary | ICD-10-CM | POA: Insufficient documentation

## 2024-05-04 DIAGNOSIS — M5416 Radiculopathy, lumbar region: Secondary | ICD-10-CM | POA: Insufficient documentation

## 2024-05-04 DIAGNOSIS — Z01818 Encounter for other preprocedural examination: Secondary | ICD-10-CM

## 2024-05-04 DIAGNOSIS — Z01812 Encounter for preprocedural laboratory examination: Secondary | ICD-10-CM | POA: Insufficient documentation

## 2024-05-04 DIAGNOSIS — Z7984 Long term (current) use of oral hypoglycemic drugs: Secondary | ICD-10-CM | POA: Insufficient documentation

## 2024-05-04 DIAGNOSIS — E039 Hypothyroidism, unspecified: Secondary | ICD-10-CM | POA: Insufficient documentation

## 2024-05-04 DIAGNOSIS — E119 Type 2 diabetes mellitus without complications: Secondary | ICD-10-CM | POA: Insufficient documentation

## 2024-05-04 DIAGNOSIS — I10 Essential (primary) hypertension: Secondary | ICD-10-CM | POA: Insufficient documentation

## 2024-05-04 LAB — BASIC METABOLIC PANEL WITH GFR
Anion gap: 11 (ref 5–15)
BUN: 16 mg/dL (ref 8–23)
CO2: 27 mmol/L (ref 22–32)
Calcium: 9.8 mg/dL (ref 8.9–10.3)
Chloride: 103 mmol/L (ref 98–111)
Creatinine, Ser: 0.67 mg/dL (ref 0.44–1.00)
GFR, Estimated: >60 mL/min
Glucose, Bld: 173 mg/dL — ABNORMAL HIGH (ref 70–99)
Potassium: 3.7 mmol/L (ref 3.5–5.1)
Sodium: 140 mmol/L (ref 135–145)

## 2024-05-04 LAB — GLUCOSE, CAPILLARY: Glucose-Capillary: 167 mg/dL — ABNORMAL HIGH (ref 70–99)

## 2024-05-04 LAB — SURGICAL PCR SCREEN
MRSA, PCR: NEGATIVE
Staphylococcus aureus: NEGATIVE

## 2024-05-04 LAB — CBC
HCT: 36.1 % (ref 36.0–46.0)
Hemoglobin: 11.4 g/dL — ABNORMAL LOW (ref 12.0–15.0)
MCH: 24.5 pg — ABNORMAL LOW (ref 26.0–34.0)
MCHC: 31.6 g/dL (ref 30.0–36.0)
MCV: 77.6 fL — ABNORMAL LOW (ref 80.0–100.0)
Platelets: 302 10*3/uL (ref 150–400)
RBC: 4.65 MIL/uL (ref 3.87–5.11)
RDW: 12.7 % (ref 11.5–15.5)
WBC: 4.3 10*3/uL (ref 4.0–10.5)
nRBC: 0 % (ref 0.0–0.2)

## 2024-05-04 NOTE — Progress Notes (Signed)
 Anesthesia Chart Review:  Case: [id number] Date/Time: 05/07/24 0715   Procedure: POSTERIOR LUMBAR FUSION 2 LEVEL - post lumbar decmpression and fusion L4-5, L5-S1   Anesthesia type: General   Diagnosis: Lumbar radiculopathy [M54.16]   Pre-op diagnosis: Lumbar radiculopathy   [facility]   Surgeons: Burnetta Aures, MD       DISCUSSION: Patient is a 69 year old female scheduled for the above procedure. Surgery was initially scheduled for 04/02/2024, but reportedly cancelled due to her A1c results.    History includes never smoker, HTN, DM2, hypothyroidism, anemia.   A1c on 03/21/2024 was 8.0% (down from 8.2% on 01/06/2024). Current DM medications include metformin  1000 mg BID, Ozempic  weekly. Last Ozempic  04/27/2024. She reported working on dietary measures since then and reported home readings ~ 140-180. A1c was not repeated at PAT since last result was within 60 days.    03/21/2024 EKG showed SR, 1st degree A-V block, occasional PAC.She denied chest pain and SOB per PAT interview. She is not currently taking Crestor  per her preference. BP 131/76.   H/H 11.4/36.1. Patient had blood refusal signed from a previous PAT visit; However, at 05/04/2024 appointment she said she was willing to accept blood during surgery if needed. Per Blood Bank, she will need to sign blood consent on the day of surgery and would another specimen sent to lab (see RN notes).   Last office note from [facility] requested but pending. Anesthesia team to evaluate on the day of surgery.     VS: BP 131/76   Pulse 87   Temp 36.8 C   Resp 16   Ht 5' 2 (1.575 m)   Wt 60 kg   SpO2 100%   BMI 24.18 kg/m   PROVIDERS: Delores Rojelio Caldron, NP is PCP [facility]). She was previously seen by [facility].     LABS: Preoperative labs noted. See DISCUSSION.  (all labs ordered are listed, but only abnormal results are displayed)  Labs Reviewed  BASIC METABOLIC PANEL WITH GFR -  Abnormal; Notable for the following components:      Result Value   Glucose, Bld 173 (*)    All other components within normal limits  CBC - Abnormal; Notable for the following components:   Hemoglobin 11.4 (*)    MCV 77.6 (*)    MCH 24.5 (*)    All other components within normal limits  GLUCOSE, CAPILLARY - Abnormal; Notable for the following components:   Glucose-Capillary 167 (*)    All other components within normal limits  SURGICAL PCR SCREEN  TYPE AND SCREEN   - Levothyroixine was increased to 137 mcg daily on 01/10/2024 in response to 01/06/2024 TSH results of 8.15.    EKG: 03/21/2024: Sinus rhythm with 1st degree A-V block with occasional Premature ventricular complexes Otherwise normal ECG When compared with ECG of 28-Oct-2020 13:20, PR interval has lengthened Confirmed by Wendel Haws ([id number]) on 03/21/2024 10:30:40 A - PR 218 ms     CV: N/A   Past Medical History:  Diagnosis Date   Anemia    Arthritis    Diabetes mellitus    Hypertension    Hypothyroidism    Thyroid  disease    UTI (lower urinary tract infection)     Past Surgical History:  Procedure Laterality Date   CESAREAN SECTION     COLONOSCOPY     CYST EXCISION Right 07/28/2021   Procedure: EXCISION SEBACEOUS CYST RIGHT AXILLA;  Surgeon: Belinda Cough, MD;  Location: [facility];  Service: General;  Laterality: Right;   LIPOMA EXCISION Right 07/28/2021   Procedure: EXCISION OF SUBCUTANEOUS LIPOMA RIGHT UPPER ARM, LIPOMA RIGHT CHEST WALL;  Surgeon: Belinda Cough, MD;  Location: [facility];  Service: General;  Laterality: Right;   PARTIAL HYSTERECTOMY     1995    MEDICATIONS:  acetaminophen  (TYLENOL ) 500 MG tablet   blood glucose meter kit and supplies KIT   cyclobenzaprine  (FLEXERIL ) 10 MG tablet   HYDROcodone -acetaminophen  (NORCO/VICODIN) 5-325 MG tablet   levocetirizine (XYZAL ) 5 MG tablet   levothyroxine  (SYNTHROID ) 137 MCG tablet   meloxicam  (MOBIC ) 15 MG tablet    metFORMIN  (GLUCOPHAGE ) 1000 MG tablet   Multiple Vitamins-Minerals (HAIR SKIN AND NAILS FORMULA PO)   naproxen  (NAPROSYN ) 375 MG tablet   oxyCODONE -acetaminophen  (PERCOCET) 10-325 MG tablet   OZEMPIC , 0.25 OR 0.5 MG/DOSE, 2 MG/3ML SOPN   potassium chloride  SA (KLOR-CON  M) 20 MEQ tablet   rosuvastatin  (CRESTOR ) 20 MG tablet   Semaglutide , 2 MG/DOSE, 8 MG/3ML SOPN   traMADol  (ULTRAM ) 50 MG tablet   triamterene -hydrochlorothiazide  (MAXZIDE ) 75-50 MG tablet   Vitamin D, Ergocalciferol, (DRISDOL) 1.25 MG (50000 UNIT) CAPS capsule   No current facility-administered medications for this encounter.    Isaiah Ruder, PA-C Surgical Short Stay/Anesthesiology [facility] Phone [phone] [facility] Phone [phone] 05/04/2024 1:59 PM

## 2024-05-04 NOTE — Progress Notes (Addendum)
 PCP - Rojelio Daring NP, [facility] Cardiologist - denies  PPM/ICD - denies  Chest x-ray - denies EKG - 03/21/24 Stress Test - denies ECHO - denies Cardiac Cath - denies  Sleep Study - denies  Fasting Blood Sugar - 140-180 Checks Blood Sugar once a day  Last dose of GLP1 agonist-  04/27/24 GLP1 instructions: stop ozempic  one week prior to surgery.   Blood Thinner Instructions: denies Aspirin Instructions: denies  ERAS Protcol - clear liquids until 04:30  COVID TEST- N/A   Anesthesia review: yes, records requested. Pt prior surgery date cancelled d/t elevated blood glucose and has seen her PCP for glycemic control since then. Reports no changes to medication but has been watching her diet more closely. Requested most recent note from PCP office.   Patient denies shortness of breath, fever, cough and chest pain at PAT appointment.   All instructions explained to the patient, with a verbal understanding of the material. Patient agrees to go over the instructions while at home for a better understanding. The opportunity to ask questions was provided.

## 2024-05-04 NOTE — Progress Notes (Addendum)
 Pt had blood refusal signed from previous PAT appointment. Pt stated today that she will accept blood during surgery if needed. Type and screen sent to blood bank today. Blood bank called refusing to run type and screen unless patient has a Blood Consent signed d/t previously having blood refusal on file. Pt no longer at PAT. Pt will need a blood consent signed on the DOS, and will need to have Type and screen re-drawn on DOS. Per blood blank, sample from today cannot be held for surgery on Monday. Dr Erma notified. Pt to sign consent DOS and order for consent placed in epic.

## 2024-05-04 NOTE — Progress Notes (Signed)
 Blood bank called back tos ay they will run T/S today, but patient will still need to sign blood consent on DOS. Patient will need ABO/RH DOS as seconds sample.

## 2024-05-04 NOTE — Anesthesia Preprocedure Evaluation (Signed)
 "                                  Anesthesia Evaluation  Patient identified by MRN, date of birth, ID band Patient awake    Reviewed: Allergy & Precautions, NPO status , Patient's Chart, lab work & pertinent test results  History of Anesthesia Complications Negative for: history of anesthetic complications  Airway Mallampati: I  TM Distance: >3 FB Neck ROM: Full    Dental no notable dental hx. (+) Edentulous Upper, Edentulous Lower   Pulmonary neg pulmonary ROS   Pulmonary exam normal breath sounds clear to auscultation       Cardiovascular hypertension, (-) angina (-) Past MI Normal cardiovascular exam Rhythm:Regular Rate:Normal  EKG  SR, 1st degree A-V block, occasional PAC   Neuro/Psych    GI/Hepatic negative GI ROS, Neg liver ROS,,,  Endo/Other  diabetes, Type 2, Oral Hypoglycemic AgentsHypothyroidism  On semaglutide   Renal/GU Lab Results      Component                Value               Date                          K                        3.7                 05/04/2024                    CREATININE               0.67                05/04/2024                GFRNONAA                 >60                 05/04/2024                  GLUCOSE                  173 (H)             05/04/2024                Musculoskeletal  (+) Arthritis ,    Abdominal   Peds  Hematology  (+) Blood dyscrasia, anemia Lab Results      Component                Value               Date                      WBC                      4.3                 05/04/2024                HGB  11.4 (L)            05/04/2024                HCT                      36.1                05/04/2024                MCV                      77.6 (L)            05/04/2024                PLT                      302                 05/04/2024              Anesthesia Other Findings All: PCN, Sulfa, Tumeric, Glipizide , Januvia  and jardiance   Reproductive/Obstetrics                               Anesthesia Physical Anesthesia Plan  ASA: 2  Anesthesia Plan: General   Post-op Pain Management: Ketamine  IV*, Ofirmev  IV (intra-op)* and Lidocaine  infusion*   Induction: Intravenous  PONV Risk Score and Plan: 3 and Treatment may vary due to age or medical condition, Midazolam , Ondansetron , Dexamethasone  and Propofol  infusion  Airway Management Planned: Oral ETT  Additional Equipment: None  Intra-op Plan:   Post-operative Plan: Extubation in OR  Informed Consent: I have reviewed the patients History and Physical, chart, labs and discussed the procedure including the risks, benefits and alternatives for the proposed anesthesia with the patient or authorized representative who has indicated his/her understanding and acceptance.     Dental advisory given  Plan Discussed with:   Anesthesia Plan Comments: (PAT note written 05/04/2024 by Evaleigh Mccamy, PA-C. Pt ok w blood if needed  Ketamine  0.3mg /Kg  Remi  + propafol  )         Anesthesia Quick Evaluation  "

## 2024-05-07 ENCOUNTER — Inpatient Hospital Stay (HOSPITAL_COMMUNITY): Payer: Self-pay | Admitting: Vascular Surgery

## 2024-05-07 ENCOUNTER — Encounter (HOSPITAL_COMMUNITY): Admission: RE | Disposition: A | Payer: Self-pay | Source: Home / Self Care | Attending: Orthopedic Surgery

## 2024-05-07 ENCOUNTER — Inpatient Hospital Stay (HOSPITAL_COMMUNITY)

## 2024-05-07 ENCOUNTER — Other Ambulatory Visit: Payer: Self-pay

## 2024-05-07 ENCOUNTER — Encounter (HOSPITAL_COMMUNITY): Payer: Self-pay | Admitting: Orthopedic Surgery

## 2024-05-07 ENCOUNTER — Inpatient Hospital Stay (HOSPITAL_COMMUNITY)
Admission: RE | Admit: 2024-05-07 | Discharge: 2024-05-10 | DRG: 552 | Disposition: A | Attending: Orthopedic Surgery | Admitting: Orthopedic Surgery

## 2024-05-07 DIAGNOSIS — Z981 Arthrodesis status: Principal | ICD-10-CM

## 2024-05-07 DIAGNOSIS — E119 Type 2 diabetes mellitus without complications: Secondary | ICD-10-CM

## 2024-05-07 LAB — ABO/RH: ABO/RH(D): O POS

## 2024-05-07 LAB — GLUCOSE, CAPILLARY
Glucose-Capillary: 139 mg/dL — ABNORMAL HIGH (ref 70–99)
Glucose-Capillary: 163 mg/dL — ABNORMAL HIGH (ref 70–99)
Glucose-Capillary: 159 mg/dL — ABNORMAL HIGH (ref 70–99)
Glucose-Capillary: 197 mg/dL — ABNORMAL HIGH (ref 70–99)
Glucose-Capillary: 233 mg/dL — ABNORMAL HIGH (ref 70–99)
Glucose-Capillary: 175 mg/dL — ABNORMAL HIGH (ref 70–99)

## 2024-05-07 LAB — POCT I-STAT EG7
Acid-Base Excess: 4 mmol/L — ABNORMAL HIGH (ref 0.0–2.0)
Bicarbonate: 27.2 mmol/L (ref 20.0–28.0)
Calcium, Ion: 1.18 mmol/L (ref 1.15–1.40)
HCT: 32 % — ABNORMAL LOW (ref 36.0–46.0)
Hemoglobin: 10.9 g/dL — ABNORMAL LOW (ref 12.0–15.0)
O2 Saturation: 100 %
Potassium: 3.8 mmol/L (ref 3.5–5.1)
Sodium: 142 mmol/L (ref 135–145)
TCO2: 28 mmol/L (ref 22–32)
pCO2, Ven: 34.2 mmHg — ABNORMAL LOW (ref 44–60)
pH, Ven: 7.508 — ABNORMAL HIGH (ref 7.25–7.43)
pO2, Ven: 175 mmHg — ABNORMAL HIGH (ref 32–45)

## 2024-05-07 LAB — TYPE AND SCREEN
ABO/RH(D): O POS
Antibody Screen: NEGATIVE

## 2024-05-07 MED ORDER — FENTANYL CITRATE (PF) 100 MCG/2ML IJ SOLN: 25.0000 ug | INTRAMUSCULAR | Status: DC | PRN

## 2024-05-07 MED ORDER — PHENYLEPHRINE 80 MCG/ML (10ML) SYRINGE FOR IV PUSH (FOR BLOOD PRESSURE SUPPORT)
PREFILLED_SYRINGE | INTRAVENOUS | Status: AC
  Filled 2024-05-07: qty 20

## 2024-05-07 MED ORDER — LACTATED RINGERS IV SOLN: INTRAVENOUS | Status: DC

## 2024-05-07 MED ORDER — TRANEXAMIC ACID-NACL 1000-0.7 MG/100ML-% IV SOLN
INTRAVENOUS | Status: AC
  Filled 2024-05-07: qty 100

## 2024-05-07 MED ORDER — CHLORHEXIDINE GLUCONATE 0.12 % MT SOLN: 15.0000 mL | Freq: Once | OROMUCOSAL | Status: AC

## 2024-05-07 MED ORDER — ONDANSETRON HCL 4 MG/2ML IJ SOLN
INTRAMUSCULAR | Status: AC
  Filled 2024-05-07: qty 2

## 2024-05-07 MED ORDER — THROMBIN 5000 UNITS EX KIT
PACK | CUTANEOUS | Status: AC
  Filled 2024-05-07: qty 1

## 2024-05-07 MED ORDER — MIDAZOLAM HCL 2 MG/2ML IJ SOLN
INTRAMUSCULAR | Status: AC
  Filled 2024-05-07: qty 2

## 2024-05-07 MED ORDER — ONDANSETRON HCL 4 MG/2ML IJ SOLN: 4.0000 mg | Freq: Four times a day (QID) | INTRAMUSCULAR | Status: DC | PRN

## 2024-05-07 MED ORDER — SUCCINYLCHOLINE CHLORIDE 200 MG/10ML IV SOSY
PREFILLED_SYRINGE | INTRAVENOUS | Status: DC | PRN
  Administered 2024-05-07: 100 mg via INTRAVENOUS

## 2024-05-07 MED ORDER — SODIUM CHLORIDE 0.9 % IV SOLN: 250.0000 mL | INTRAVENOUS | Status: AC

## 2024-05-07 MED ORDER — METHOCARBAMOL 500 MG PO TABS: 500.0000 mg | ORAL_TABLET | Freq: Three times a day (TID) | ORAL | 0 refills | Status: AC | PRN

## 2024-05-07 MED ORDER — FENTANYL CITRATE (PF) 100 MCG/2ML IJ SOLN
INTRAMUSCULAR | Status: AC
  Filled 2024-05-07: qty 2

## 2024-05-07 MED ORDER — DEXAMETHASONE SOD PHOSPHATE PF 10 MG/ML IJ SOLN
INTRAMUSCULAR | Status: AC
  Filled 2024-05-07: qty 1

## 2024-05-07 MED ORDER — EPHEDRINE SULFATE-NACL 50-0.9 MG/10ML-% IV SOSY
PREFILLED_SYRINGE | INTRAVENOUS | Status: DC | PRN
  Administered 2024-05-07 (×2): 2.5 mg via INTRAVENOUS

## 2024-05-07 MED ORDER — OXYCODONE HCL 5 MG PO TABS
5.0000 mg | ORAL_TABLET | ORAL | Status: DC | PRN
  Administered 2024-05-07: 5 mg via ORAL

## 2024-05-07 MED ORDER — DEXMEDETOMIDINE HCL IN NACL 80 MCG/20ML IV SOLN
INTRAVENOUS | Status: AC
  Filled 2024-05-07: qty 20

## 2024-05-07 MED ORDER — LIDOCAINE 2% (20 MG/ML) 5 ML SYRINGE
INTRAMUSCULAR | Status: DC | PRN
  Administered 2024-05-07: 60 mg via INTRAVENOUS

## 2024-05-07 MED ORDER — ACETAMINOPHEN 10 MG/ML IV SOLN
INTRAVENOUS | Status: AC
  Filled 2024-05-07: qty 100

## 2024-05-07 MED ORDER — METHOCARBAMOL 1000 MG/10ML IJ SOLN: 500.0000 mg | Freq: Four times a day (QID) | INTRAMUSCULAR | Status: DC | PRN

## 2024-05-07 MED ORDER — ACETAMINOPHEN 325 MG PO TABS: 650.0000 mg | ORAL_TABLET | ORAL | Status: DC | PRN

## 2024-05-07 MED ORDER — SENNOSIDES-DOCUSATE SODIUM 8.6-50 MG PO TABS
1.0000 | ORAL_TABLET | Freq: Two times a day (BID) | ORAL | Status: DC | PRN
  Administered 2024-05-07 – 2024-05-09 (×2): 2 via ORAL
  Filled 2024-05-07 (×2): qty 2

## 2024-05-07 MED ORDER — PROPOFOL 10 MG/ML IV BOLUS
INTRAVENOUS | Status: DC | PRN
  Administered 2024-05-07: 20 mg via INTRAVENOUS
  Administered 2024-05-07: 180 mg via INTRAVENOUS

## 2024-05-07 MED ORDER — PHENYLEPHRINE 80 MCG/ML (10ML) SYRINGE FOR IV PUSH (FOR BLOOD PRESSURE SUPPORT)
PREFILLED_SYRINGE | INTRAVENOUS | Status: DC | PRN
  Administered 2024-05-07: 80 ug via INTRAVENOUS
  Administered 2024-05-07: 160 ug via INTRAVENOUS
  Administered 2024-05-07 (×2): 80 ug via INTRAVENOUS
  Administered 2024-05-07: 160 ug via INTRAVENOUS

## 2024-05-07 MED ORDER — SODIUM CHLORIDE 0.9% FLUSH
3.0000 mL | Freq: Two times a day (BID) | INTRAVENOUS | Status: DC
  Administered 2024-05-07 – 2024-05-10 (×5): 3 mL via INTRAVENOUS

## 2024-05-07 MED ORDER — FENTANYL CITRATE (PF) 250 MCG/5ML IJ SOLN: INTRAMUSCULAR | Status: DC | PRN

## 2024-05-07 MED ORDER — ONDANSETRON HCL 4 MG PO TABS: 4.0000 mg | ORAL_TABLET | Freq: Four times a day (QID) | ORAL | Status: DC | PRN

## 2024-05-07 MED ORDER — ROCURONIUM BROMIDE 10 MG/ML (PF) SYRINGE: PREFILLED_SYRINGE | INTRAVENOUS | Status: DC | PRN

## 2024-05-07 MED ORDER — EPHEDRINE 5 MG/ML INJ
INTRAVENOUS | Status: AC
  Filled 2024-05-07: qty 5

## 2024-05-07 MED ORDER — POLYETHYLENE GLYCOL 3350 17 G PO PACK: 17.0000 g | PACK | Freq: Every day | ORAL | Status: DC | PRN

## 2024-05-07 MED ORDER — VANCOMYCIN HCL IN DEXTROSE 1-5 GM/200ML-% IV SOLN
1000.0000 mg | Freq: Once | INTRAVENOUS | Status: AC
  Administered 2024-05-07: 1000 mg via INTRAVENOUS
  Filled 2024-05-07: qty 200

## 2024-05-07 MED ORDER — ORAL CARE MOUTH RINSE: 15.0000 mL | Freq: Once | OROMUCOSAL | Status: AC

## 2024-05-07 MED ORDER — OXYCODONE HCL 5 MG/5ML PO SOLN: 5.0000 mg | Freq: Once | ORAL | Status: AC | PRN

## 2024-05-07 MED ORDER — ONDANSETRON HCL 4 MG/2ML IJ SOLN
INTRAMUSCULAR | Status: DC | PRN
  Administered 2024-05-07: 4 mg via INTRAVENOUS

## 2024-05-07 MED ORDER — 0.9 % SODIUM CHLORIDE (POUR BTL) OPTIME
TOPICAL | Status: DC | PRN
  Administered 2024-05-07 (×2): 1000 mL

## 2024-05-07 MED ORDER — DROPERIDOL 2.5 MG/ML IJ SOLN: 0.6250 mg | Freq: Once | INTRAMUSCULAR | Status: DC | PRN

## 2024-05-07 MED ORDER — HYDROMORPHONE HCL 1 MG/ML IJ SOLN
INTRAMUSCULAR | Status: AC
  Filled 2024-05-07: qty 0.5

## 2024-05-07 MED ORDER — ALBUMIN HUMAN 5 % IV SOLN: INTRAVENOUS | Status: DC | PRN

## 2024-05-07 MED ORDER — INSULIN ASPART 100 UNIT/ML IJ SOLN
0.0000 [IU] | Freq: Three times a day (TID) | INTRAMUSCULAR | Status: DC
  Administered 2024-05-07: 5 [IU] via SUBCUTANEOUS
  Filled 2024-05-07: qty 5

## 2024-05-07 MED ORDER — DEXMEDETOMIDINE HCL IN NACL 80 MCG/20ML IV SOLN
INTRAVENOUS | Status: DC | PRN
  Administered 2024-05-07 (×2): 8 ug via INTRAVENOUS

## 2024-05-07 MED ORDER — LIDOCAINE IN D5W 4-5 MG/ML-% IV SOLN
INTRAVENOUS | Status: DC | PRN
  Administered 2024-05-07: 1 mg/min via INTRAVENOUS

## 2024-05-07 MED ORDER — INSULIN ASPART 100 UNIT/ML IJ SOLN
0.0000 [IU] | Freq: Three times a day (TID) | INTRAMUSCULAR | Status: DC
  Administered 2024-05-08 – 2024-05-09 (×2): 5 [IU] via SUBCUTANEOUS
  Administered 2024-05-10: 3 [IU] via SUBCUTANEOUS
  Filled 2024-05-07: qty 5
  Filled 2024-05-07: qty 3
  Filled 2024-05-07: qty 5

## 2024-05-07 MED ORDER — OXYCODONE HCL 5 MG PO TABS
10.0000 mg | ORAL_TABLET | ORAL | Status: DC | PRN
  Administered 2024-05-07 – 2024-05-10 (×13): 10 mg via ORAL
  Filled 2024-05-07 (×14): qty 2

## 2024-05-07 MED ORDER — CHLORHEXIDINE GLUCONATE 0.12 % MT SOLN
15.0000 mL | Freq: Once | OROMUCOSAL | Status: AC
  Administered 2024-05-07: 15 mL via OROMUCOSAL
  Filled 2024-05-07: qty 15

## 2024-05-07 MED ORDER — SODIUM CHLORIDE 0.9% FLUSH: 3.0000 mL | INTRAVENOUS | Status: DC | PRN

## 2024-05-07 MED ORDER — INSULIN ASPART 100 UNIT/ML IJ SOLN
0.0000 [IU] | Freq: Every day | INTRAMUSCULAR | Status: DC
  Administered 2024-05-09: 3 [IU] via SUBCUTANEOUS
  Filled 2024-05-07: qty 3

## 2024-05-07 MED ORDER — TRANEXAMIC ACID-NACL 1000-0.7 MG/100ML-% IV SOLN
INTRAVENOUS | Status: DC | PRN
  Administered 2024-05-07: 1000 mg via INTRAVENOUS

## 2024-05-07 MED ORDER — KETAMINE HCL 50 MG/5ML IJ SOSY
PREFILLED_SYRINGE | INTRAMUSCULAR | Status: AC
  Filled 2024-05-07: qty 5

## 2024-05-07 MED ORDER — HYDROMORPHONE HCL 1 MG/ML IJ SOLN: 0.5000 mg | INTRAMUSCULAR | Status: AC | PRN

## 2024-05-07 MED ORDER — METHOCARBAMOL 500 MG PO TABS
500.0000 mg | ORAL_TABLET | Freq: Four times a day (QID) | ORAL | Status: DC | PRN
  Administered 2024-05-07 – 2024-05-10 (×9): 500 mg via ORAL
  Filled 2024-05-07 (×9): qty 1

## 2024-05-07 MED ORDER — OXYCODONE-ACETAMINOPHEN 10-325 MG PO TABS: 1.0000 | ORAL_TABLET | Freq: Four times a day (QID) | ORAL | 0 refills | Status: AC | PRN

## 2024-05-07 MED ORDER — DEXAMETHASONE SOD PHOSPHATE PF 10 MG/ML IJ SOLN
INTRAMUSCULAR | Status: DC | PRN
  Administered 2024-05-07: 5 mg via INTRAVENOUS

## 2024-05-07 MED ORDER — HYDROMORPHONE HCL 1 MG/ML IJ SOLN
INTRAMUSCULAR | Status: DC | PRN
  Administered 2024-05-07: .25 mg via INTRAVENOUS
  Administered 2024-05-07: .5 mg via INTRAVENOUS
  Administered 2024-05-07: .25 mg via INTRAVENOUS

## 2024-05-07 MED ORDER — SODIUM CHLORIDE 0.9 % IV SOLN
0.1500 ug/kg/min | INTRAVENOUS | Status: AC
  Administered 2024-05-07: .1 ug/kg/min via INTRAVENOUS
  Filled 2024-05-07: qty 2000

## 2024-05-07 MED ORDER — MAGNESIUM CITRATE PO SOLN
1.0000 | Freq: Once | ORAL | Status: AC | PRN
  Administered 2024-05-08: 1 via ORAL
  Filled 2024-05-07: qty 296

## 2024-05-07 MED ORDER — THROMBIN 20000 UNITS EX SOLR
CUTANEOUS | Status: AC
  Filled 2024-05-07: qty 20000

## 2024-05-07 MED ORDER — PHENOL 1.4 % MT LIQD: 1.0000 | OROMUCOSAL | Status: DC | PRN

## 2024-05-07 MED ORDER — LEVOTHYROXINE SODIUM 137 MCG PO TABS
137.0000 ug | ORAL_TABLET | Freq: Every day | ORAL | Status: DC
  Administered 2024-05-08 – 2024-05-10 (×3): 137 ug via ORAL
  Filled 2024-05-07 (×3): qty 1

## 2024-05-07 MED ORDER — FENTANYL CITRATE (PF) 100 MCG/2ML IJ SOLN
INTRAMUSCULAR | Status: DC | PRN
  Administered 2024-05-07: 50 ug via INTRAVENOUS
  Administered 2024-05-07 (×2): 25 ug via INTRAVENOUS

## 2024-05-07 MED ORDER — TRIAMTERENE-HCTZ 75-50 MG PO TABS
1.0000 | ORAL_TABLET | Freq: Every morning | ORAL | Status: DC
  Filled 2024-05-07 (×3): qty 1

## 2024-05-07 MED ORDER — PHENYLEPHRINE HCL-NACL 20-0.9 MG/250ML-% IV SOLN
INTRAVENOUS | Status: DC | PRN
  Administered 2024-05-07: 30 ug/min via INTRAVENOUS

## 2024-05-07 MED ORDER — THROMBIN (RECOMBINANT) 5000 UNITS EX SOLR
CUTANEOUS | Status: DC | PRN
  Administered 2024-05-07: 5000 [IU] via TOPICAL

## 2024-05-07 MED ORDER — LIDOCAINE 2% (20 MG/ML) 5 ML SYRINGE
INTRAMUSCULAR | Status: AC
  Filled 2024-05-07: qty 10

## 2024-05-07 MED ORDER — MENTHOL 3 MG MT LOZG: 1.0000 | LOZENGE | OROMUCOSAL | Status: DC | PRN

## 2024-05-07 MED ORDER — PROPOFOL 1000 MG/100ML IV EMUL
INTRAVENOUS | Status: AC
  Filled 2024-05-07: qty 100

## 2024-05-07 MED ORDER — METFORMIN HCL 500 MG PO TABS
1000.0000 mg | ORAL_TABLET | Freq: Two times a day (BID) | ORAL | Status: DC
  Administered 2024-05-07 – 2024-05-10 (×6): 1000 mg via ORAL
  Filled 2024-05-07 (×6): qty 2

## 2024-05-07 MED ORDER — ACETAMINOPHEN 650 MG RE SUPP: 650.0000 mg | RECTAL | Status: DC | PRN

## 2024-05-07 MED ORDER — OXYCODONE HCL 5 MG PO TABS
ORAL_TABLET | ORAL | Status: AC
  Filled 2024-05-07: qty 1

## 2024-05-07 MED ORDER — SUCCINYLCHOLINE CHLORIDE 200 MG/10ML IV SOSY
PREFILLED_SYRINGE | INTRAVENOUS | Status: AC
  Filled 2024-05-07: qty 10

## 2024-05-07 MED ORDER — SURGIFLO WITH THROMBIN (HEMOSTATIC MATRIX KIT) OPTIME
TOPICAL | Status: DC | PRN
  Administered 2024-05-07: 3 via TOPICAL

## 2024-05-07 MED ORDER — ACETAMINOPHEN 10 MG/ML IV SOLN
INTRAVENOUS | Status: DC | PRN
  Administered 2024-05-07: 1000 mg via INTRAVENOUS

## 2024-05-07 MED ORDER — BUPIVACAINE-EPINEPHRINE (PF) 0.25% -1:200000 IJ SOLN
INTRAMUSCULAR | Status: AC
  Filled 2024-05-07: qty 30

## 2024-05-07 MED ORDER — MIDAZOLAM HCL (PF) 2 MG/2ML IJ SOLN
INTRAMUSCULAR | Status: DC | PRN
  Administered 2024-05-07: 2 mg via INTRAVENOUS

## 2024-05-07 MED ORDER — BUPIVACAINE-EPINEPHRINE 0.25% -1:200000 IJ SOLN
INTRAMUSCULAR | Status: DC | PRN
  Administered 2024-05-07: 10 mL

## 2024-05-07 MED ORDER — ONDANSETRON 4 MG PO TBDP: 4.0000 mg | ORAL_TABLET | Freq: Three times a day (TID) | ORAL | 0 refills | Status: AC | PRN

## 2024-05-07 MED ORDER — OXYCODONE HCL 5 MG PO TABS
5.0000 mg | ORAL_TABLET | Freq: Once | ORAL | Status: AC | PRN
  Administered 2024-05-07: 5 mg via ORAL

## 2024-05-07 MED ORDER — PROPOFOL 500 MG/50ML IV EMUL
INTRAVENOUS | Status: DC | PRN
  Administered 2024-05-07: 150 ug/kg/min via INTRAVENOUS

## 2024-05-07 MED ORDER — KETAMINE HCL 50 MG/5ML IJ SOSY
PREFILLED_SYRINGE | INTRAMUSCULAR | Status: DC | PRN
  Administered 2024-05-07: 10 mg via INTRAVENOUS
  Administered 2024-05-07: 25 mg via INTRAVENOUS
  Administered 2024-05-07: 15 mg via INTRAVENOUS

## 2024-05-07 MED ORDER — THROMBIN 20000 UNITS EX SOLR: CUTANEOUS | Status: DC | PRN

## 2024-05-07 NOTE — Anesthesia Procedure Notes (Addendum)
 Procedure Name: Intubation Date/Time: 05/07/2024 7:43 AM  Performed by: Mollie Olivia SAUNDERS, CRNAPre-anesthesia Checklist: Patient identified, Emergency Drugs available, Suction available and Patient being monitored Patient Re-evaluated:Patient Re-evaluated prior to induction Oxygen Delivery Method: Circle system utilized Preoxygenation: Pre-oxygenation with 100% oxygen Induction Type: IV induction Ventilation: Mask ventilation without difficulty Laryngoscope Size: 3 and Mac Grade View: Grade I Tube type: Oral Tube size: 7.0 mm Number of attempts: 1 Airway Equipment and Method: Stylet Placement Confirmation: ETT inserted through vocal cords under direct vision, positive ETCO2 and breath sounds checked- equal and bilateral Secured at: 21 (gum) cm Tube secured with: Tape Dental Injury: Teeth and Oropharynx as per pre-operative assessment

## 2024-05-07 NOTE — Discharge Instructions (Signed)

## 2024-05-07 NOTE — Brief Op Note (Signed)
 12/08/2023   10:23 AM   PATIENT:  05/07/2024  12:19 PM  PATIENT:  Sylvia Conner  69 y.o. female  PRE-OPERATIVE DIAGNOSIS:  Lumbar radiculopathy  POST-OPERATIVE DIAGNOSIS:  Lumbar radiculopathy  PROCEDURE:  Procedures with comments: POSTERIOR LUMBAR FUSION 2 LEVEL (N/A) - post lumbar decmpression and fusion L4-5, L5-S1  SURGEON:  Surgeons and Role:    DEWAINE Burnetta Aures, MD - Primary  PHYSICIAN ASSISTANT: Jeoffrey Sages, PA  ANESTHESIA:   general  EBL:  350 mL   BLOOD ADMINISTERED:none  DRAINS: none   LOCAL MEDICATIONS USED:  MARCAINE      SPECIMEN:  No Specimen  DISPOSITION OF SPECIMEN:  N/A  COUNTS:  YES  TOURNIQUET:  * No tourniquets in log *  DICTATION: .Dragon Dictation  PLAN OF CARE: Admit to inpatient   PATIENT DISPOSITION:  PACU - hemodynamically stable.

## 2024-05-07 NOTE — Anesthesia Postprocedure Evaluation (Signed)
"   Anesthesia Post Note  Patient: Sylvia Conner  Procedure(s) Performed: POSTERIOR LUMBAR FUSION 2 LEVEL (Back)     Patient location during evaluation: PACU Anesthesia Type: General Level of consciousness: awake and alert Pain management: pain level controlled Vital Signs Assessment: post-procedure vital signs reviewed and stable Respiratory status: spontaneous breathing, nonlabored ventilation, respiratory function stable and patient connected to nasal cannula oxygen Cardiovascular status: blood pressure returned to baseline and stable Postop Assessment: no apparent nausea or vomiting Anesthetic complications: no   No notable events documented.  Last Vitals:  Vitals:   05/07/24 1345 05/07/24 1400  BP: 111/62 109/65  Pulse: 86 84  Resp: 17 17  Temp:  37.1 C  SpO2: 99% 100%    Last Pain:  Vitals:   05/07/24 1345  TempSrc:   PainSc: 5                  Taegen Lennox P Tatijana Bierly      "

## 2024-05-08 LAB — CBC
HCT: 28.9 % — ABNORMAL LOW (ref 36.0–46.0)
Hemoglobin: 9.1 g/dL — ABNORMAL LOW (ref 12.0–15.0)
MCH: 24.8 pg — ABNORMAL LOW (ref 26.0–34.0)
MCHC: 31.5 g/dL (ref 30.0–36.0)
MCV: 78.7 fL — ABNORMAL LOW (ref 80.0–100.0)
Platelets: 238 10*3/uL (ref 150–400)
RBC: 3.67 MIL/uL — ABNORMAL LOW (ref 3.87–5.11)
RDW: 12.9 % (ref 11.5–15.5)
WBC: 8.7 10*3/uL (ref 4.0–10.5)
nRBC: 0 % (ref 0.0–0.2)

## 2024-05-08 LAB — GLUCOSE, CAPILLARY
Glucose-Capillary: 136 mg/dL — ABNORMAL HIGH (ref 70–99)
Glucose-Capillary: 215 mg/dL — ABNORMAL HIGH (ref 70–99)
Glucose-Capillary: 223 mg/dL — ABNORMAL HIGH (ref 70–99)
Glucose-Capillary: 169 mg/dL — ABNORMAL HIGH (ref 70–99)
Glucose-Capillary: 167 mg/dL — ABNORMAL HIGH (ref 70–99)

## 2024-05-08 MED ORDER — SODIUM CHLORIDE 0.9 % IV BOLUS
1000.0000 mL | Freq: Once | INTRAVENOUS | Status: AC
  Administered 2024-05-08: 1000 mL via INTRAVENOUS

## 2024-05-08 MED ORDER — FERROUS SULFATE 325 (65 FE) MG PO TABS
325.0000 mg | ORAL_TABLET | Freq: Every day | ORAL | Status: DC
  Administered 2024-05-09 – 2024-05-10 (×2): 325 mg via ORAL
  Filled 2024-05-08 (×2): qty 1

## 2024-05-08 NOTE — Progress Notes (Signed)
 Patient call c/o dizziness, diaphoretic. V/S check patient is positive orthostatic. MD and PA aware.

## 2024-05-08 NOTE — Evaluation (Signed)
 Occupational Therapy Evaluation Patient Details Name: Sylvia Conner MRN: [id number] DOB: 1956/02/27 Today's Date: 05/08/2024   History of Present Illness   69 yo F adm 05/07/24 s/p L4-S1 PLIF PMH: anemia, athritis, DM HTN hypothroidism, thyroid  disease UTI     Clinical Impressions Patient is s/p PLIF L4-S1 surgery resulting in functional limitations due to the deficits listed below (see OT problem list). Pt with orthostatic BP that is limiting progression of OT evaluation with ted hose don. Pt pending bolus of fluids. OT to follow acutely for further education.  Patient will benefit from skilled OT acutely to increase independence and safety with ADLS to allow discharge to be further assessed once BP stable for OT session.      If plan is discharge home, recommend the following:   A Disch help with walking and/or transfers;A Nucci help with bathing/dressing/bathroom     Functional Status Assessment   Patient has had a recent decline in their functional status and demonstrates the ability to make significant improvements in function in a reasonable and predictable amount of time.     Equipment Recommendations   BSC/3in1;Other (comment) (RW)     Recommendations for Other Services         Precautions/Restrictions   Precautions Precautions: Back;Fall Precaution Booklet Issued: Yes (comment) Recall of Precautions/Restrictions: Impaired Precaution/Restrictions Comments: educated on back precautions with handout provided Required Braces or Orthoses: Spinal Brace Spinal Brace: Lumbar corset;Applied in sitting position Restrictions Weight Bearing Restrictions Per Provider Order: No     Mobility Bed Mobility Overal bed mobility: Needs Assistance Bed Mobility: Rolling, Supine to Sit, Sit to Supine Rolling: Min assist   Supine to sit: Min assist Sit to supine: Min assist   General bed mobility comments: pt with back precaution cues    Transfers Overall transfer  level: Needs assistance Equipment used: 1 person hand held assist Transfers: Sit to/from Stand Sit to Stand: Min assist                  Balance Overall balance assessment: Needs assistance Sitting-balance support: Bilateral upper extremity supported, Feet supported Sitting balance-Leahy Scale: Fair     Standing balance support: Bilateral upper extremity supported, During functional activity Standing balance-Leahy Scale: Fair                             ADL either performed or assessed with clinical judgement   ADL Overall ADL's : Needs assistance/impaired Eating/Feeding: Set up   Grooming: Applying deodorant   Upper Body Bathing: Minimal assistance   Lower Body Bathing: Moderate assistance   Upper Body Dressing : Minimal assistance   Lower Body Dressing: Moderate assistance                 General ADL Comments: session greatly affected by orthostatic BP. pt with max (A) to don brace and doff  Back handout provided and reviewed adls in detail. Pt educated on: clothing between brace, never sleep in brace, set an alarm at night for medication, avoid sitting for long periods of time, correct bed positioning for sleeping, correct sequence for bed mobility, avoiding lifting more than 5 pounds and never wash directly over incision. Needs further reinforcement of precautions    Vision Baseline Vision/History: 1 Wears glasses Ability to See in Adequate Light: 0 Adequate Patient Visual Report: No change from baseline Vision Assessment?: Wears glasses for driving     Perception  Praxis         Pertinent Vitals/Pain Pain Assessment Pain Assessment: Faces Faces Pain Scale: Hurts even more Pain Location: Incision site Pain Descriptors / Indicators: Operative site guarding, Sore Pain Intervention(s): Premedicated before session, Monitored during session, Repositioned     Extremity/Trunk Assessment Upper Extremity Assessment Upper Extremity  Assessment: Overall WFL for tasks assessed   Lower Extremity Assessment Lower Extremity Assessment: Overall WFL for tasks assessed;Defer to PT evaluation   Cervical / Trunk Assessment Cervical / Trunk Assessment: Back Surgery   Communication Communication Communication: No apparent difficulties   Cognition Arousal: Alert Behavior During Therapy: WFL for tasks assessed/performed Cognition: No apparent impairments                               Following commands: Intact       Cueing  General Comments   Cueing Techniques: Verbal cues  BP 120/62 supine sitting 98/66 standing 83/54 supine again 111/66incision dry and dressing intact   Exercises     Shoulder Instructions      Home Living Family/patient expects to be discharged to:: Private residence Living Arrangements: Spouse/significant other Available Help at Discharge: Family;Available PRN/intermittently (Husband works evenings. Daughter will be there until Sunday to help (next 5 days)) Type of Home: House Home Access: Stairs to enter [employer] of Steps: 5 Entrance Stairs-Rails: Right;Left;Can reach both Home Layout: One level     Bathroom Shower/Tub: Chief Strategy Officer: Standard     Home Equipment: None   Additional Comments: dog named CHEMICAL ENGINEER) spouse walks the dog daily.      Prior Functioning/Environment Prior Level of Function : Independent/Modified Independent;Driving                    OT Problem List: Decreased strength;Decreased activity tolerance;Impaired balance (sitting and/or standing);Decreased safety awareness;Decreased knowledge of use of DME or AE;Decreased knowledge of precautions   OT Treatment/Interventions: Self-care/ADL training;Therapeutic exercise;Energy conservation;DME and/or AE instruction;Manual therapy;Therapeutic activities;Patient/family education;Balance training      OT Goals(Current goals can be found in the care plan section)    Acute Rehab OT Goals Patient Stated Goal: to lay down OT Goal Formulation: With patient Time For Goal Achievement: 05/22/24 Potential to Achieve Goals: Good   OT Frequency:  Min 2X/week    Co-evaluation              AM-PAC OT 6 Clicks Daily Activity     Outcome Measure Help from another person eating meals?: A Hannis Help from another person taking care of personal grooming?: A Szymczak Help from another person toileting, which includes using toliet, bedpan, or urinal?: A Lot Help from another person bathing (including washing, rinsing, drying)?: A Lot Help from another person to put on and taking off regular upper body clothing?: A Behanna Help from another person to put on and taking off regular lower body clothing?: A Lot 6 Click Score: 15   End of Session Equipment Utilized During Treatment: Gait belt;Back brace Nurse Communication: Mobility status;Precautions  Activity Tolerance: Treatment limited secondary to medical complications (Comment) (orthostatic) Patient left: in bed;with call bell/phone within reach;Other (comment) (RN aware of vitals)  OT Visit Diagnosis: Unsteadiness on feet (R26.81);Muscle weakness (generalized) (M62.81)                Time: 9243-9186 OT Time Calculation (min): 17 min Charges:  OT General Charges $OT Visit: 1 Visit OT Evaluation $OT Eval Moderate Complexity:  1 Mod   Brynn, OTR/L  Acute Rehabilitation Services Office: [phone] .   Ely Molt 05/08/2024, 10:50 AM

## 2024-05-08 NOTE — Progress Notes (Addendum)
" ° ° °  Subjective: Procedures (LRB): POSTERIOR LUMBAR FUSION 2 LEVEL (N/A) 1 Day Post-Op  Patient reports pain as 4 on 0-10 scale.  Reports decreased leg pain reports incisional back pain   Positive void Negative bowel movement Positive flatus Negative chest pain or shortness of breath  Objective: Vital signs in last 24 hours: Temp:  [97.6 F (36.4 C)-98.9 F (37.2 C)] 98.4 F (36.9 C) (02/03 0727) Pulse Rate:  [72-97] 74 (02/03 0729) Resp:  [14-20] 16 (02/03 0727) BP: (94-147)/(52-76) 108/62 (02/03 0729) SpO2:  [94 %-100 %] 99 % (02/03 0729)  Intake/Output from previous day: 02/02 0701 - 02/03 0700 In: 3900 [P.O.:1200; I.V.:1800; IV Piggyback:900] Out: 2451 [Urine:2101; Blood:350]  Labs: Recent Labs    05/07/24 1210  HCT 32.0*   Recent Labs    05/07/24 1210  NA 142  K 3.8   No results for input(s): LABPT, INR in the last 72 hours.  Physical Exam: Neurovascular intact Sensation intact distally Intact pulses distally Dorsiflexion/Plantar flexion intact Incision: dressing C/D/I No cellulitis present Compartment soft Body mass index is 24.69 kg/m.   Assessment/Plan: Sylvia Conner is a very pleasant 69 year old female who is POD1 from Posterior L4-S1 decompression with posterior fusion . Surgical intervention was successful and without complications. Her hospital course has been complicated by some orthostatic hypotension that is symptomatic. With OT this morning her vitals were 120/62 mmHg with laying; 98/66 mmHg with sitting; and 83/54 mmHg with standing. When the patient was returned to the laying down position she returned to 111/66 mmHg. She states that her pre-operative leg pain is much improved since surgery. She is tolerating oral intake well. She is compliant with the LSO brace. She is complaint with the incentive spirometer. She reports that his pain is well controled with oral pain medications. Dressing is c/d/I.    Patient stable  xrays n/a Continue  mobilization with physical therapy Continue care  Continue pain medical management  Continue Incentive spirometry  Continue to ice surgical site WBAT  With the orthostatic hypotension we will encourage the patient to drink more fluids and use the pulm toilet/ In addition we will order more fluids to be given via IV. We will order a CBC to assess for any signs of anemia.   Patient will be d/c to home possibly tomorrow pending orthostatic hypotension improves.     This patient was seen on rounds today with Dr. Donaciano Sprang, MD  Addendum 05/08/2024 14:14 ---After reviewing the patients CBC she is experiencing acute anemia due to blood loss from surgery yesterday. We will start oral iron and repeat CBC in the AM  Sylvia Conner [name] PA-C for Dr. Donaciano Sprang, MD [facility] [phone]  "

## 2024-05-08 NOTE — TOC Transition Note (Signed)
 Transition of Care [facility]) - Discharge Note   Patient Details  Name: Sylvia Conner MRN: [id number] Date of Birth: 1955/08/17  [facility] CM/SW Contact:  Andrez JULIANNA George, RN Phone Number: 05/08/2024, 2:22 PM   Clinical Narrative:     Pt provided choice for [facility] agency for [facility] PT/OT. She has selected [facility]. Information on the AVS. [facility] will contact her for the first home visit.  Pt has transportation home at dc.  Final next level of care: Home w Home Health Services Barriers to Discharge: No Barriers Identified   Patient Goals and CMS Choice   CMS Medicare.gov Compare Post Acute Care list provided to:: Patient Choice offered to / list presented to : Patient      Discharge Placement                       Discharge Plan and Services Additional resources added to the After Visit Summary for                            [facility] Arranged: PT, OT HH Agency: [facility] Date [facility] Agency Contacted: 05/08/24   Representative spoke with at [facility] Agency: H[facility]  Social Drivers of Health (SDOH) Interventions SDOH Screenings   Food Insecurity: No Food Insecurity (10/28/2021)  Housing: Low Risk (07/13/2021)  Transportation Needs: No Transportation Needs (10/28/2021)  Depression (PHQ2-9): Low Risk (01/06/2024)  Tobacco Use: Low Risk (05/07/2024)     Readmission Risk Interventions     No data to display

## 2024-05-09 LAB — CBC
HCT: 28.9 % — ABNORMAL LOW (ref 36.0–46.0)
Hemoglobin: 9 g/dL — ABNORMAL LOW (ref 12.0–15.0)
MCH: 24.5 pg — ABNORMAL LOW (ref 26.0–34.0)
MCHC: 31.1 g/dL (ref 30.0–36.0)
MCV: 78.5 fL — ABNORMAL LOW (ref 80.0–100.0)
Platelets: 228 10*3/uL (ref 150–400)
RBC: 3.68 MIL/uL — ABNORMAL LOW (ref 3.87–5.11)
RDW: 12.9 % (ref 11.5–15.5)
WBC: 8 10*3/uL (ref 4.0–10.5)
nRBC: 0 % (ref 0.0–0.2)

## 2024-05-09 LAB — GLUCOSE, CAPILLARY
Glucose-Capillary: 148 mg/dL — ABNORMAL HIGH (ref 70–99)
Glucose-Capillary: 229 mg/dL — ABNORMAL HIGH (ref 70–99)
Glucose-Capillary: 115 mg/dL — ABNORMAL HIGH (ref 70–99)
Glucose-Capillary: 272 mg/dL — ABNORMAL HIGH (ref 70–99)

## 2024-05-09 NOTE — Progress Notes (Signed)
" ° ° °  Subjective: Procedures (LRB): POSTERIOR LUMBAR FUSION 2 LEVEL (N/A) 2 Days Post-Op  Patient reports pain as 4 on 0-10 scale.  Reports decreased leg pain reports incisional back pain   Positive void Negative bowel movement Positive flatus Negative chest pain or shortness of breath  Objective: Vital signs in last 24 hours: Temp:  [98.4 F (36.9 C)-100 F (37.8 C)] 99.5 F (37.5 C) (02/04 0500) Pulse Rate:  [72-105] 105 (02/04 0500) Resp:  [16-20] 18 (02/04 0500) BP: (73-144)/(57-72) 135/70 (02/04 0500) SpO2:  [94 %-100 %] 99 % (02/04 0500)  Intake/Output from previous day: 02/03 0701 - 02/04 0700 In: 720 [P.O.:720] Out: -   Labs: Recent Labs    05/07/24 1210 05/08/24 0920  WBC  --  8.7  RBC  --  3.67*  HCT 32.0* 28.9*  PLT  --  238   Recent Labs    05/07/24 1210  NA 142  K 3.8   No results for input(s): LABPT, INR in the last 72 hours.  Physical Exam: Sensation intact distally Intact pulses distally Dorsiflexion/Plantar flexion intact Incision: dressing C/D/I No cellulitis present Compartment soft Body mass index is 24.69 kg/m.   Assessment/Plan: Sylvia Conner is a very pleasant 69 year old female who is POD 2 from Posterior L4-S1 decompression with posterior fusion . Surgical intervention was successful and without complications. Her hospital course has been complicated by some orthostatic hypotension that is symptomatic. With OT yesterday morning her vitals were 120/62 mmHg with laying; 98/66 mmHg with sitting; and 83/54 mmHg with standing. When the patient was returned to the laying down position she returned to 111/66 mmHg. She states that her pre-operative leg pain is much improved since surgery. She is tolerating oral intake well. She is compliant with the LSO brace. She is complaint with the incentive spirometer. She reports that his pain is well controled with oral pain medications. Dressing is c/d/I. Her hypotension did improve throughout the day,  however she is still having s/sx of orthostatic hypotension but the patient reports that she feels better than yesterday. We started oral iron supplementation. We believe that she is experiencing acute anemia due to blood loss from surgery.     Patient stable  xrays n/a Continue mobilization with physical therapy Continue care  Continue pain medical management  Continue Incentive spirometry  Continue to ice surgical site WBAT  Reorder CBC this am to trend from yesterday   Patient will be d/c to home possibly tomorrow or later this afternoon pending orthostatic hypotension improves  Patient will be going home with HHPT      Sylvia Moya PA-C for Dr. Donaciano Sprang, MD [facility] [phone]  "

## 2024-05-09 NOTE — Progress Notes (Signed)
 Physical Therapy Treatment  Patient Details Name: Sylvia Conner MRN: [id number] DOB: 04-04-1956 Today's Date: 05/09/2024   History of Present Illness Pt is a 69 y/o female who presents s/p L4-S1 PLIF on 05/07/2024. PMH signfiicant for DM, HTN, hypothyroidism.    PT Comments  PT saw for second session for stair training. Pt reports fear of falling. Issued gait belt for safety. Pt with wheelchair transport to stairs for stair training, and was able to complete 5 stairs with Min assist for boost up and balance support. Pt ambulated ~75' back towards room but required seated rest due to pain and fatigue. Reviewed precautions, brace application/wearing schedule, and car transfer. Will continue to follow and progress as able per POC.     If plan is discharge home, recommend the following: A [place] help with walking and/or transfers;A Debell help with bathing/dressing/bathroom;Assistance with cooking/housework;Assist for transportation;Help with stairs or ramp for entrance   Can travel by private vehicle        Equipment Recommendations  Rolling walker (2 wheels)    Recommendations for Other Services       Precautions / Restrictions Precautions Precautions: Back;Fall Precaution Booklet Issued: Yes (comment) Recall of Precautions/Restrictions: Impaired Precaution/Restrictions Comments: educated on back precautions with handout provided Required Braces or Orthoses: Spinal Brace Spinal Brace: Lumbar corset;Applied in sitting position Restrictions Weight Bearing Restrictions Per Provider Order: No     Mobility  Bed Mobility Overal bed mobility: Needs Assistance Bed Mobility: Rolling, Sit to Sidelying, Sidelying to Sit Rolling: Supervision Sidelying to sit: Supervision     Sit to sidelying: Min assist General bed mobility comments: VC's for optimal log roll technique. Assist for LE elevation back up into bed at end of session.    Transfers Overall transfer level: Needs  assistance Equipment used: Rolling walker (2 wheels) Transfers: Sit to/from Stand Sit to Stand: Contact guard assist           General transfer comment: VC's for hand placement on seated surface for safety.    Ambulation/Gait Ambulation/Gait assistance: Contact guard assist Gait Distance (Feet): 75 Feet Assistive device: Rolling walker (2 wheels) Gait Pattern/deviations: Decreased stride length, Shuffle, Trunk flexed, Narrow base of support Gait velocity: Decreased Gait velocity interpretation: <1.31 ft/sec, indicative of household ambulator   General Gait Details: Slow and guarded. Pt with difficulty clearing floor and was shuffling feet along. No overt LOB noted, however pt unsteady and required assist for walker management at times.   Stairs Stairs: Yes Stairs assistance: Min assist Stair Management: Two rails, Step to pattern, Forwards Number of Stairs: 5 General stair comments: VC's for sequencing and general safety. Assist for boost up to next step   Wheelchair Mobility     Tilt Bed    Modified Rankin (Stroke Patients Only)       Balance Overall balance assessment: Needs assistance Sitting-balance support: Bilateral upper extremity supported, Feet supported Sitting balance-Leahy Scale: Fair Sitting balance - Comments: Occasional sitting LOB - almost jerking like she was about to fall asleep while sitting up.   Standing balance support: Bilateral upper extremity supported, During functional activity, Reliant on assistive device for balance Standing balance-Leahy Scale: Poor Standing balance comment: Occasional LOB while standing with only 1 UE support while BP being taken.                            Communication Communication Communication: No apparent difficulties  Cognition Arousal: Alert Behavior During Therapy: [facility] for tasks assessed/performed  PT - Cognitive impairments: No apparent impairments                          Following commands: Intact      Cueing Cueing Techniques: Verbal cues  Exercises      General Comments General comments (skin integrity, edema, etc.): BP 137/75 in sitting after ambulation      Pertinent Vitals/Pain Pain Assessment Pain Assessment: Faces Faces Pain Scale: Hurts Mariotti more Pain Location: buttock area Pain Descriptors / Indicators: Dull, Operative site guarding Pain Intervention(s): Limited activity within patient's tolerance, Monitored during session, Repositioned    Home Living                          Prior Function            PT Goals (current goals can now be found in the care plan section) Acute Rehab PT Goals Patient Stated Goal: Feel better, be able to manage at home by sunday when daughter has to leave and she will be alone while husband works in evenings. PT Goal Formulation: With patient Time For Goal Achievement: 05/15/24 Potential to Achieve Goals: Good Progress towards PT goals: Progressing toward goals    Frequency    Min 5X/week      PT Plan      Co-evaluation              AM-PAC PT 6 Clicks Mobility   Outcome Measure  Help needed turning from your back to your side while in a flat bed without using bedrails?: A Ratliff Help needed moving from lying on your back to sitting on the side of a flat bed without using bedrails?: A Misner Help needed moving to and from a bed to a chair (including a wheelchair)?: A Tanzi Help needed standing up from a chair using your arms (e.g., wheelchair or bedside chair)?: A Overbeck Help needed to walk in hospital room?: A Mormile Help needed climbing 3-5 steps with a railing? : A Birchall 6 Click Score: 18    End of Session Equipment Utilized During Treatment: Gait belt;Back brace Activity Tolerance: Patient limited by fatigue Patient left: in bed;with call bell/phone within reach Nurse Communication: Mobility status PT Visit Diagnosis: Unsteadiness on feet (R26.81);Pain Pain  - part of body:  (back)     Time: 1359-1430 PT Time Calculation (min) (ACUTE ONLY): 31 min  Charges:    $Gait Training: 23-37 mins PT General Charges $$ ACUTE PT VISIT: 1 Visit                     Sylvia Conner, PT, DPT Acute Rehabilitation Services Secure Chat Preferred Office: [phone]    Sylvia Conner 05/09/2024, 2:38 PM

## 2024-05-09 NOTE — Progress Notes (Signed)
 Physical Therapy Treatment  Patient Details Name: Sylvia Conner MRN: [id number] DOB: 1955-04-20 Today's Date: 05/09/2024   History of Present Illness Pt is a 69 y/o female who presents s/p L4-S1 PLIF on 05/07/2024. PMH signfiicant for DM, HTN, hypothyroidism.    PT Comments  Pt progressing with post-op mobility. She was able to demonstrate transfers and ambulation with gross min assist and RW for support. Pt was unable to tolerate the ambulation distance to the stairs for training, and will defer to next session. Reinforced education on precautions, brace application/wearing schedule, and appropriate activity progression. Will continue to follow.      If plan is discharge home, recommend the following: A Tarman help with walking and/or transfers;A Husser help with bathing/dressing/bathroom;Assistance with cooking/housework;Assist for transportation;Help with stairs or ramp for entrance   Can travel by private vehicle        Equipment Recommendations  Rolling walker (2 wheels)    Recommendations for Other Services       Precautions / Restrictions Precautions Precautions: Back;Fall Precaution Booklet Issued: Yes (comment) Recall of Precautions/Restrictions: Impaired Precaution/Restrictions Comments: educated on back precautions with handout provided Required Braces or Orthoses: Spinal Brace Spinal Brace: Lumbar corset;Applied in sitting position Restrictions Weight Bearing Restrictions Per Provider Order: No     Mobility  Bed Mobility Overal bed mobility: Needs Assistance Bed Mobility: Rolling, Sit to Sidelying Rolling: Supervision       Sit to sidelying: Min assist General bed mobility comments: VC's for optimal log roll technique. Assist for LE elevation back up into bed at end of session.    Transfers Overall transfer level: Needs assistance Equipment used: Rolling walker (2 wheels) Transfers: Sit to/from Stand Sit to Stand: Min assist           General transfer  comment: Assist to power up to full stand. Slow and effortful due to pain. VC's for hand placement on seated surface for safety.    Ambulation/Gait Ambulation/Gait assistance: Contact guard assist, Min assist Gait Distance (Feet): 75 Feet Assistive device: Rolling walker (2 wheels) Gait Pattern/deviations: Decreased stride length, Shuffle, Trunk flexed, Narrow base of support Gait velocity: Decreased Gait velocity interpretation: <1.31 ft/sec, indicative of household ambulator   General Gait Details: Slow and guarded. Pt with difficulty clearing floor and was shuffling feet along. No overt LOB noted, however pt unsteady and required assist for walker management at times.   Stairs Stairs:  (Unable to progress to stair training at this time.)           Wheelchair Mobility     Tilt Bed    Modified Rankin (Stroke Patients Only)       Balance Overall balance assessment: Needs assistance Sitting-balance support: Bilateral upper extremity supported, Feet supported Sitting balance-Leahy Scale: Fair     Standing balance support: Bilateral upper extremity supported, During functional activity, Reliant on assistive device for balance Standing balance-Leahy Scale: Poor                              Communication Communication Communication: No apparent difficulties  Cognition Arousal: Alert Behavior During Therapy: WFL for tasks assessed/performed   PT - Cognitive impairments: No apparent impairments                         Following commands: Intact      Cueing Cueing Techniques: Verbal cues  Exercises      General Comments General comments (skin  integrity, edema, etc.): BP 137/75 in sitting after ambulation      Pertinent Vitals/Pain Pain Assessment Pain Assessment: Faces Faces Pain Scale: Hurts Rada more Pain Location: buttock area Pain Descriptors / Indicators: Dull, Operative site guarding Pain Intervention(s): Monitored during  session, Limited activity within patient's tolerance, Repositioned    Home Living                          Prior Function            PT Goals (current goals can now be found in the care plan section) Acute Rehab PT Goals Patient Stated Goal: Feel better, be able to manage at home by sunday when daughter has to leave and she will be alone while husband works in evenings. PT Goal Formulation: With patient Time For Goal Achievement: 05/15/24 Potential to Achieve Goals: Good Progress towards PT goals: Progressing toward goals    Frequency    Min 5X/week      PT Plan      Co-evaluation              AM-PAC PT 6 Clicks Mobility   Outcome Measure  Help needed turning from your back to your side while in a flat bed without using bedrails?: A Heeg Help needed moving from lying on your back to sitting on the side of a flat bed without using bedrails?: A Thorley Help needed moving to and from a bed to a chair (including a wheelchair)?: A Gamboa Help needed standing up from a chair using your arms (e.g., wheelchair or bedside chair)?: A Wax Help needed to walk in hospital room?: A Christoffel Help needed climbing 3-5 steps with a railing? : A Lot 6 Click Score: 17    End of Session Equipment Utilized During Treatment: Gait belt;Back brace Activity Tolerance: Patient limited by fatigue Patient left: in bed;with call bell/phone within reach;with family/visitor present Nurse Communication: Mobility status PT Visit Diagnosis: Unsteadiness on feet (R26.81);Pain Pain - part of body:  (back)     Time: 8879-8854 PT Time Calculation (min) (ACUTE ONLY): 25 min  Charges:    $Gait Training: 23-37 mins PT General Charges $$ ACUTE PT VISIT: 1 Visit                     Sylvia Conner, PT, DPT Acute Rehabilitation Services Secure Chat Preferred Office: [phone]    Sylvia Conner 05/09/2024, 12:45 PM

## 2024-05-09 NOTE — Progress Notes (Signed)
 OT NOte  Pt demonstrates decreased SBP with static standing but with increased time and ted hose rebounded. Pt does report mild dizziness and pain at buttock.  Pt progressed to bathroom surface transfers this session. Pt educated on sports administrator and expressed daughter purchased off [place].    05/09/24 1100  OT Visit Information  Last OT Received On 05/09/24  Assistance Needed +1  History of Present Illness 69 yo F adm 05/07/24 s/p L4-S1 PLIF PMH: anemia, athritis, DM HTN hypothroidism, thyroid  disease UTI  Precautions  Precautions Back;Fall  Precaution Booklet Issued Yes (comment)  Recall of Precautions/Restrictions Impaired  Precaution/Restrictions Comments educated on back precautions with handout provided  Required Braces or Orthoses Spinal Brace  Spinal Brace Lumbar corset;Applied in sitting position  Restrictions  Weight Bearing Restrictions Per Provider Order No  Pain Assessment  Pain Assessment Faces  Faces Pain Scale 6  Pain Location buttock area  Pain Descriptors / Indicators Dull;Guarding  Pain Intervention(s) Repositioned;Monitored during session  Cognition  Arousal Alert  Behavior During Therapy WFL for tasks assessed/performed  Cognition No apparent impairments  Following Commands  Following commands Intact  Cueing  Cueing Techniques Verbal cues  Communication  Communication No apparent difficulties  Lower Extremity Assessment  Lower Extremity Assessment Defer to PT evaluation  ADL  Overall ADL's  Needs assistance/impaired  Eating/Feeding Independent  Grooming Wash/dry hands;Contact guard assist  Grooming Details (indicate cue type and reason) sink level  Lower Body Dressing Minimal assistance  Lower Body Dressing Details (indicate cue type and reason) educated on use of AE with back precautions  Toilet Transfer Minimal assistance;Ambulation;Regular Toilet;Grab bars  Toilet Transfer Details (indicate cue type and reason) required (A) from standard commode for  elevation  Toileting- Clothing Manipulation and Hygiene Minimal assistance  Functional mobility during ADLs Contact guard assist;Rolling walker (2 wheels)  General ADL Comments pt demonstrates drop in SBP initially with a rebound within 3 minutes. pt with education on static standing prior to initiating movement.  Bed Mobility  Overal bed mobility Needs Assistance  Bed Mobility Rolling;Supine to Sit;Sit to Supine  Rolling Contact guard assist  Supine to sit Min assist  Sit to supine Contact guard assist  General bed mobility comments pt with cues to sequence with back precautions. pt with (A) to elevate from surface 22degrees HOB. pt with mod cues to return to supine from static sitting.  Transfers  Overall transfer level Needs assistance  Equipment used Rolling walker (2 wheels)  Transfers Sit to/from Stand  Sit to Stand Min assist  General transfer comment pt needs increased help to elevate from surface. pt could benefit from pillow or towel in chair to help elevate surfaces at home  Balance  Overall balance assessment Mild deficits observed, not formally tested  General Comments  General comments (skin integrity, edema, etc.) SBP 125/68 sitting 135/71 standing 103/71 and with 3 minutes 118/67  OT - End of Session  Equipment Utilized During Treatment Back brace;Rolling walker (2 wheels)  Activity Tolerance Patient tolerated treatment well  Patient left in bed;with call bell/phone within reach  Nurse Communication Mobility status;Precautions  OT Assessment/Plan  OT Visit Diagnosis Unsteadiness on feet (R26.81);Muscle weakness (generalized) (M62.81)  OT Frequency (ACUTE ONLY) Min 2X/week  Follow Up Recommendations Home health OT  Patient can return home with the following A Guidice help with walking and/or transfers;A Delorey help with bathing/dressing/bathroom  OT Equipment BSC/3in1;Other (comment)  AM-PAC OT 6 Clicks Daily Activity Outcome Measure (Version 2)  Help from another  person eating meals?  3  Help from another person taking care of personal grooming? 3  Help from another person toileting, which includes using toliet, bedpan, or urinal? 3  Help from another person bathing (including washing, rinsing, drying)? 2  Help from another person to put on and taking off regular upper body clothing? 3  Help from another person to put on and taking off regular lower body clothing? 2  6 Click Score 16  Progressive Mobility  What is the highest level of mobility based on the mobility assessment? Level 4 (Ambulates with assistance) - Balance while stepping forward/back - Complete  Mobility Referral Yes  Activity Ambulated with assistance  OT Goal Progression  Progress towards OT goals Progressing toward goals  Acute Rehab OT Goals  Patient Stated Goal to lay down - expressed having poor sleep  OT Goal Formulation With patient  Time For Goal Achievement 05/22/24  Potential to Achieve Goals Good  ADL Goals  Pt Will Perform Lower Body Dressing with supervision;with adaptive equipment;sit to/from stand  Pt Will Transfer to Toilet with supervision;ambulating;bedside commode  Additional ADL Goal #1 pt will don doff back brace mod I  OT Time Calculation  OT Start Time (ACUTE ONLY) 0834  OT Stop Time (ACUTE ONLY) 0900  OT Time Calculation (min) 26 min  OT General Charges  $OT Visit 1 Visit  OT Treatments  $Self Care/Home Management  23-37 mins   Brynn, OTR/L  Acute Rehabilitation Services Office: [phone] .

## 2024-05-09 NOTE — Plan of Care (Signed)
 " Problem: Education: Goal: Knowledge of General Education information will improve Description: Including pain rating scale, medication(s)/side effects and non-pharmacologic comfort measures Outcome: Progressing   Problem: Health Behavior/Discharge Planning: Goal: Ability to manage health-related needs will improve Outcome: Progressing   Problem: Clinical Measurements: Goal: Ability to maintain clinical measurements within normal limits will improve Outcome: Progressing Goal: Will remain free from infection Outcome: Progressing Goal: Diagnostic test results will improve Outcome: Progressing Goal: Respiratory complications will improve Outcome: Progressing Goal: Cardiovascular complication will be avoided Outcome: Progressing   Problem: Activity: Goal: Risk for activity intolerance will decrease Outcome: Progressing   Problem: Nutrition: Goal: Adequate nutrition will be maintained Outcome: Progressing   Problem: Coping: Goal: Level of anxiety will decrease Outcome: Progressing   Problem: Elimination: Goal: Will not experience complications related to bowel motility Outcome: Progressing Goal: Will not experience complications related to urinary retention Outcome: Progressing   Problem: Pain Managment: Goal: General experience of comfort will improve and/or be controlled Outcome: Progressing   Problem: Safety: Goal: Ability to remain free from injury will improve Outcome: Progressing   Problem: Skin Integrity: Goal: Risk for impaired skin integrity will decrease Outcome: Progressing   Problem: Education: Goal: Knowledge of the prescribed therapeutic regimen will improve Outcome: Progressing   Problem: Bowel/Gastric: Goal: Gastrointestinal status for postoperative course will improve Outcome: Progressing   Problem: Cardiac: Goal: Ability to maintain an adequate cardiac output Outcome: Progressing Goal: Will show no evidence of cardiac arrhythmias Outcome:  Progressing   Problem: Nutritional: Goal: Will attain and maintain optimal nutritional status Outcome: Progressing   Problem: Neurological: Goal: Will regain or maintain usual level of consciousness Outcome: Progressing   Problem: Clinical Measurements: Goal: Ability to maintain clinical measurements within normal limits Outcome: Progressing Goal: Postoperative complications will be avoided or minimized Outcome: Progressing   Problem: Respiratory: Goal: Will regain and/or maintain adequate ventilation Outcome: Progressing Goal: Respiratory status will improve Outcome: Progressing   Problem: Skin Integrity: Goal: Demonstrates signs of wound healing without infection Outcome: Progressing   Problem: Urinary Elimination: Goal: Will remain free from infection Outcome: Progressing Goal: Ability to achieve and maintain adequate urine output Outcome: Progressing   Problem: Education: Goal: Ability to describe self-care measures that may prevent or decrease complications (Diabetes Survival Skills Education) will improve Outcome: Progressing Goal: Individualized Educational Video(s) Outcome: Progressing   Problem: Coping: Goal: Ability to adjust to condition or change in health will improve Outcome: Progressing   Problem: Fluid Volume: Goal: Ability to maintain a balanced intake and output will improve Outcome: Progressing   Problem: Health Behavior/Discharge Planning: Goal: Ability to identify and utilize available resources and services will improve Outcome: Progressing Goal: Ability to manage health-related needs will improve Outcome: Progressing   Problem: Metabolic: Goal: Ability to maintain appropriate glucose levels will improve Outcome: Progressing   Problem: Nutritional: Goal: Maintenance of adequate nutrition will improve Outcome: Progressing Goal: Progress toward achieving an optimal weight will improve Outcome: Progressing   Problem: Skin  Integrity: Goal: Risk for impaired skin integrity will decrease Outcome: Progressing   Problem: Tissue Perfusion: Goal: Adequacy of tissue perfusion will improve Outcome: Progressing   Problem: Education: Goal: Ability to verbalize activity precautions or restrictions will improve Outcome: Progressing Goal: Knowledge of the prescribed therapeutic regimen will improve Outcome: Progressing Goal: Understanding of discharge needs will improve Outcome: Progressing   Problem: Activity: Goal: Ability to avoid complications of mobility impairment will improve Outcome: Progressing Goal: Ability to tolerate increased activity will improve Outcome: Progressing Goal: Will remain free from falls  Outcome: Progressing   Problem: Bowel/Gastric: Goal: Gastrointestinal status for postoperative course will improve Outcome: Progressing   "

## 2024-05-10 ENCOUNTER — Other Ambulatory Visit (HOSPITAL_COMMUNITY): Payer: Self-pay

## 2024-05-10 LAB — GLUCOSE, CAPILLARY: Glucose-Capillary: 163 mg/dL — ABNORMAL HIGH (ref 70–99)

## 2024-05-10 MED ORDER — FERROUS SULFATE 325 (65 FE) MG PO TABS
325.0000 mg | ORAL_TABLET | Freq: Every day | ORAL | 0 refills | Status: AC
  Filled 2024-05-10: qty 30, 30d supply, fill #0

## 2024-05-10 NOTE — Care Management Important Message (Signed)
 Important Message  Patient Details  Name: Sylvia Conner MRN: [id number] Date of Birth: 28-Sep-1955   Important Message Given:  Yes - Medicare IM     Jennie Laneta Dragon 05/10/2024, 10:03 AM

## 2024-05-10 NOTE — Progress Notes (Signed)
" ° ° °  Subjective: Procedures (LRB): POSTERIOR LUMBAR FUSION 2 LEVEL (N/A) 3 Days Post-Op  Patient reports pain as 3 on 0-10 scale.  Reports decreased leg pain reports incisional back pain   Positive void Negative bowel movement Positive flatus Negative chest pain or shortness of breath  Objective: Vital signs in last 24 hours: Temp:  [98.5 F (36.9 C)-100 F (37.8 C)] 99.1 F (37.3 C) (02/05 0347) Pulse Rate:  [98-105] 100 (02/05 0347) Resp:  [16-18] 18 (02/05 0347) BP: (116-150)/(61-81) 126/72 (02/05 0347) SpO2:  [97 %-100 %] 100 % (02/05 0347)  Intake/Output from previous day: 02/04 0701 - 02/05 0700 In: 580 [P.O.:580] Out: -   Labs: Recent Labs    05/08/24 0920 05/09/24 0906  WBC 8.7 8.0  RBC 3.67* 3.68*  HCT 28.9* 28.9*  PLT 238 228   Recent Labs    05/07/24 1210  NA 142  K 3.8   No results for input(s): LABPT, INR in the last 72 hours.  Physical Exam: ABD soft Sensation intact distally Intact pulses distally Dorsiflexion/Plantar flexion intact Incision: dressing C/D/I No cellulitis present Compartment soft Body mass index is 24.69 kg/m.   Assessment/Plan: Suellen is a very pleasant 69 year old female who is POD 3 from Posterior L4-S1 decompression with posterior fusion . Surgical intervention was successful and without complications. Her hospital course has been complicated by some orthostatic hypotension that is symptomatic. With OT tuesday morning her vitals were 120/62 mmHg with laying; 98/66 mmHg with sitting; and 83/54 mmHg with standing. When the patient was returned to the laying down position she returned to 111/66 mmHg. She states that her pre-operative leg pain is much improved since surgery. She is tolerating oral intake well. She is compliant with the LSO brace. She is complaint with the incentive spirometer. She reports that his pain is well controled with oral pain medications. Dressing is c/d/I. Her hypotension did improve throughout  the day, however she is still having s/sx of orthostatic hypotension but the patient reports that she feels better. We started oral iron supplementation. We believe that she is experiencing acute anemia due to blood loss from surgery.     Patient stable  xrays n/a Continue mobilization with physical therapy Continue care  Continue pain medical management  Continue Incentive spirometry  Continue to ice surgical site WBAT   Patient will be d/c to home later this afternoon    Patient will be going home with HHPT      Estefana Taylor PA-C for Dr. Donaciano Sprang, MD [facility] [phone]  "

## 2024-05-10 NOTE — Progress Notes (Signed)
 Patient alert and oriented, voided, ambulated. Surgical site clean and dry no sign of infection. D/c instructions explain and given all questions answered. Patient d/c home with RW, and 3 in 1 per order.

## 2024-05-10 NOTE — Discharge Summary (Signed)
 "  Patient ID: Sylvia Conner MRN: [id number] DOB/AGE: [birth year] 69 y.o.  Admit date: 05/07/2024 Discharge date: 05/10/2024  Admission Diagnoses:  Principal Problem:   S/P lumbar fusion   Discharge Diagnoses:  Principal Problem:   S/P lumbar fusion  status post Procedures: POSTERIOR LUMBAR FUSION 2 LEVEL  Past Medical History:  Diagnosis Date   Anemia    Arthritis    Diabetes mellitus    Hypertension    Hypothyroidism    Thyroid  disease    UTI (lower urinary tract infection)     Surgeries: Procedures: POSTERIOR LUMBAR FUSION 2 LEVEL on 05/07/2024   Consultants:   Discharged Condition: Improved  Hospital Course: Sylvia Conner is an 69 y.o. female who was admitted 05/07/2024 for operative treatment of S/P lumbar fusion. Patient failed conservative treatments (please see the history and physical for the specifics) and had severe unremitting pain that affects sleep, daily activities and work/hobbies. After pre-op clearance, the patient was taken to the operating room on 05/07/2024 and underwent  Procedures: POSTERIOR LUMBAR FUSION 2 LEVEL.    Patient was given perioperative antibiotics:  Anti-infectives (From admission, onward)    Start     Dose/Rate Route Frequency Ordered Stop   05/07/24 2000  vancomycin  (VANCOCIN ) IVPB 1000 mg/200 mL premix        1,000 mg 200 mL/hr over 60 Minutes Intravenous  Once 05/07/24 1428 05/08/24 0814   05/07/24 0730  vancomycin  (VANCOCIN ) IVPB 1000 mg/200 mL premix        1,000 mg 200 mL/hr over 60 Minutes Intravenous  Once 05/07/24 0723 05/07/24 0858        Patient was given sequential compression devices and early ambulation to prevent DVT.   Patient benefited maximally from hospital stay and there were no complications. At the time of discharge, the patient was urinating/moving their bowels without difficulty, tolerating a regular diet, pain is controlled with oral pain medications and they have been cleared by PT/OT.   Recent vital signs:  Patient Vitals for the past 24 hrs:  BP Temp Temp src Pulse Resp SpO2  05/10/24 0347 126/72 99.1 F (37.3 C) Oral 100 18 100 %  05/09/24 2255 116/61 98.9 F (37.2 C) Oral 99 18 97 %  05/09/24 1935 (!) 149/76 98.5 F (36.9 C) Oral 99 18 98 %  05/09/24 1650 (!) 150/81 100 F (37.8 C) Oral (!) 105 16 100 %  05/09/24 1135 137/75 -- -- 98 18 100 %  05/09/24 0738 (!) 144/70 99.2 F (37.3 C) Oral (!) 103 16 98 %     Recent laboratory studies:  Recent Labs    05/07/24 1210 05/08/24 0920 05/09/24 0906  WBC  --  8.7 8.0  HGB 10.9* 9.1* 9.0*  HCT 32.0* 28.9* 28.9*  PLT  --  238 228  NA 142  --   --   K 3.8  --   --      Discharge Medications:   Allergies as of 05/10/2024       Reactions   Penicillins Anaphylaxis, Rash   Has patient had a PCN reaction causing immediate rash, facial/tongue/throat swelling, SOB or lightheadedness with hypotension: Yes Has patient had a PCN reaction causing severe rash involving mucus membranes or skin necrosis: No Has patient had a PCN reaction that required hospitalization Yes Has patient had a PCN reaction occurring within the last 10 years: No If all of the above answers are NO, then may proceed with Cephalosporin use.   Januvia  [sitagliptin ] Nausea Only  Sulfa Antibiotics Swelling   Turmeric Hives   Glipizide  Rash   Breakout on arms   Jardiance  [empagliflozin ] Rash   Scallops [shellfish Allergy] Hives        Medication List     STOP taking these medications    acetaminophen  500 MG tablet Commonly known as: TYLENOL    cyclobenzaprine  10 MG tablet Commonly known as: FLEXERIL    HAIR SKIN AND NAILS FORMULA PO   HYDROcodone -acetaminophen  5-325 MG tablet Commonly known as: NORCO/VICODIN   levocetirizine 5 MG tablet Commonly known as: XYZAL    meloxicam  15 MG tablet Commonly known as: Mobic    naproxen  375 MG tablet Commonly known as: NAPROSYN    potassium chloride  SA 20 MEQ tablet Commonly known as: KLOR-CON  M    rosuvastatin  20 MG tablet Commonly known as: Crestor    traMADol  50 MG tablet Commonly known as: ULTRAM    Vitamin D (Ergocalciferol) 1.25 MG (50000 UNIT) Caps capsule Commonly known as: DRISDOL       TAKE these medications    blood glucose meter kit and supplies Kit Dispense based on patient and insurance preference. Use up to four times daily as directed. (FOR E11.9).   ferrous sulfate  325 (65 FE) MG tablet Take 1 tablet (325 mg total) by mouth daily with breakfast.   levothyroxine  137 MCG tablet Commonly known as: Synthroid  Take 1 tablet (137 mcg total) by mouth daily before breakfast.   metFORMIN  1000 MG tablet Commonly known as: GLUCOPHAGE  TAKE 1 TABLET BY MOUTH TWICE DAILY WITH A MEAL   methocarbamol  500 MG tablet Commonly known as: ROBAXIN  Take 1 tablet (500 mg total) by mouth every 8 (eight) hours as needed for up to 5 days for muscle spasms.   ondansetron  4 MG disintegrating tablet Commonly known as: ZOFRAN -ODT Take 1 tablet (4 mg total) by mouth every 8 (eight) hours as needed for nausea or vomiting.   oxyCODONE -acetaminophen  10-325 MG tablet Commonly known as: Percocet Take 1 tablet by mouth every 6 (six) hours as needed for up to 5 days for pain. What changed: when to take this   Ozempic  (0.25 or 0.5 MG/DOSE) 2 MG/3ML Sopn Generic drug: Semaglutide (0.25 or 0.5MG /DOS) Inject 0.25 mg into the skin every 7 (seven) days. What changed: Another medication with the same name was removed. Continue taking this medication, and follow the directions you see here.   triamterene -hydrochlorothiazide  75-50 MG tablet Commonly known as: MAXZIDE  Take 1 tablet by mouth every morning.        Diagnostic Studies: DG Lumbar Spine 2-3 Views Result Date: 05/07/2024 CLINICAL DATA:  L4-S1 fusion. EXAM: LUMBAR SPINE - 2-3 VIEW COMPARISON:  11/26/2012 FINDINGS: 3 intraoperative views. These demonstrate placement of trans pedicle screw fixation device at L4-S1. No hardware  complication identified. IMPRESSION: Intraoperative imaging of L4-S1 fixation. Electronically Signed   By: Rockey Kilts M.D.   On: 05/07/2024 15:49   DG C-Arm 1-60 Min-No Report Result Date: 05/07/2024 Fluoroscopy was utilized by the requesting physician.  No radiographic interpretation.   DG C-Arm 1-60 Min-No Report Result Date: 05/07/2024 Fluoroscopy was utilized by the requesting physician.  No radiographic interpretation.   DG C-Arm 1-60 Min-No Report Result Date: 05/07/2024 Fluoroscopy was utilized by the requesting physician.  No radiographic interpretation.   DG C-Arm 1-60 Min-No Report Result Date: 05/07/2024 Fluoroscopy was utilized by the requesting physician.  No radiographic interpretation.       Contact information for follow-up providers     Burnetta Aures, MD. Schedule an appointment as soon as possible for a visit  in 2 week(s).   Specialty: Orthopedic Surgery Why: If symptoms worsen, For suture removal, For wound re-check Contact information: [address] [place] [address] [phone]              Contact information for after-discharge care     Home Medical Care     [facility]) .   Service: Home Health Services Contact information: [address] [place]  [address] [phone]                     Discharge Plan:  discharge to home  Disposition:  Sylvia Conner is a very pleasant 69 year old female who is POD 3 from Posterior L4-S1 decompression with posterior fusion . Surgical intervention was successful and without complications. Her hospital course has been complicated by some orthostatic hypotension that is symptomatic. With OT tuesday morning her vitals were 120/62 mmHg with laying; 98/66 mmHg with sitting; and 83/54 mmHg with standing. When the patient was returned to the laying down position she returned to 111/66 mmHg. She states that her pre-operative leg pain  is much improved since surgery. She is tolerating oral intake well. She is compliant with the LSO brace. She is complaint with the incentive spirometer. She reports that his pain is well controled with oral pain medications. Dressing is c/d/I. Her hypotension did improve throughout the day, however she is still having s/sx of orthostatic hypotension but the patient reports that she feels better. We started oral iron supplementation. We believe that she is experiencing acute anemia due to blood loss from surgery.    Positive void, positive flatus, positive BM. Patient will follow up with us  in clinic in 2 weeks. Patient understands that she cannot shower for the first 5 days post-op. All questions were welcomed and addressed.    Signed: Jeoffrey LOISE Sages for Dr. Donaciano Sprang [facility] [phone] 05/10/2024, 7:35 AM      "

## 2024-05-10 NOTE — Progress Notes (Signed)
 Occupational Therapy Treatment Patient Details Name: Sylvia Conner MRN: [id number] DOB: 03-Dec-1955 Today's Date: 05/10/2024   History of present illness Pt is a 69 y/o female who presents s/p L4-S1 PLIF on 05/07/2024. PMH signfiicant for DM, HTN, hypothyroidism.   OT comments  Patient with good progress toward patient focused goals, no symptomatic dizziness with position changes.  Patient needing only Min A for lower body ADL, and CGA for initial sit to stand, once up, supervision for in room mobility/toileting.  Patient will have adequate assist at home for ADL and iADL completion.  HH OT could be considered if she wants it.  No further OT needs in the acute setting.  Recommend follow up as prescribed by MD.        If plan is discharge home, recommend the following:  A Swartzlander help with walking and/or transfers;A Ranney help with bathing/dressing/bathroom   Equipment Recommendations  BSC/3in1;Other (comment)    Recommendations for Other Services      Precautions / Restrictions Precautions Precautions: Back;Fall Precaution Booklet Issued: Yes (comment) Recall of Precautions/Restrictions: Impaired Required Braces or Orthoses: Spinal Brace Spinal Brace: Lumbar corset;Applied in sitting position Restrictions Weight Bearing Restrictions Per Provider Order: No       Mobility Bed Mobility Overal bed mobility: Needs Assistance Bed Mobility: Sidelying to Sit, Sit to Sidelying   Sidelying to sit: Supervision     Sit to sidelying: Supervision      Transfers Overall transfer level: Needs assistance Equipment used: Rolling walker (2 wheels) Transfers: Sit to/from Stand Sit to Stand: Supervision, Contact guard assist                 Balance Overall balance assessment: Needs assistance Sitting-balance support: Bilateral upper extremity supported, Feet supported Sitting balance-Leahy Scale: Good     Standing balance support: Reliant on assistive device for balance Standing  balance-Leahy Scale: Fair                             ADL either performed or assessed with clinical judgement   ADL                   Upper Body Dressing : Set up;Sitting   Lower Body Dressing: Minimal assistance;Sit to/from stand Lower Body Dressing Details (indicate cue type and reason): educated on use of AE with back precautions Toilet Transfer: Supervision/safety;Rolling walker (2 wheels);Ambulation;Regular Toilet                  Extremity/Trunk Assessment Upper Extremity Assessment Upper Extremity Assessment: Overall WFL for tasks assessed   Lower Extremity Assessment Lower Extremity Assessment: Defer to PT evaluation   Cervical / Trunk Assessment Cervical / Trunk Assessment: Back Surgery    Vision Patient Visual Report: No change from baseline     Perception Perception Perception: Not tested   Praxis Praxis Praxis: Not tested   Communication Communication Communication: No apparent difficulties   Cognition Arousal: Alert Behavior During Therapy: WFL for tasks assessed/performed Cognition: No apparent impairments                               Following commands: Intact        Cueing   Cueing Techniques: Verbal cues  Exercises      Shoulder Instructions       General Comments      Pertinent Vitals/ Pain  Pain Assessment Pain Assessment: Faces Faces Pain Scale: Hurts even more Pain Location: buttock area Pain Descriptors / Indicators: Dull, Operative site guarding Pain Intervention(s): Monitored during session  Home Living                                          Prior Functioning/Environment              Frequency           Progress Toward Goals  OT Goals(current goals can now be found in the care plan section)  Progress towards OT goals: Goals met/education completed, patient discharged from OT  Acute Rehab OT Goals OT Goal Formulation: With patient Time For  Goal Achievement: 05/11/24 Potential to Achieve Goals: Good  Plan      Co-evaluation                 AM-PAC OT 6 Clicks Daily Activity     Outcome Measure   Help from another person eating meals?: None Help from another person taking care of personal grooming?: None Help from another person toileting, which includes using toliet, bedpan, or urinal?: A Chawla Help from another person bathing (including washing, rinsing, drying)?: A Brickell Help from another person to put on and taking off regular upper body clothing?: None Help from another person to put on and taking off regular lower body clothing?: A Townsel 6 Click Score: 21    End of Session Equipment Utilized During Treatment: Back brace;Rolling walker (2 wheels)  OT Visit Diagnosis: Unsteadiness on feet (R26.81);Muscle weakness (generalized) (M62.81)   Activity Tolerance Patient tolerated treatment well   Patient Left in bed;with call bell/phone within reach   Nurse Communication Mobility status        Time: 9149-9089 OT Time Calculation (min): 20 min  Charges: OT General Charges $OT Visit: 1 Visit OT Treatments $Self Care/Home Management : 8-22 mins  05/10/2024  RP, OTR/L  Acute Rehabilitation Services  Office:  [phone]   Charlie JONETTA Halsted 05/10/2024, 9:28 AM

## 2024-05-10 NOTE — Progress Notes (Signed)
 Physical Therapy Treatment Patient Details Name: Sylvia Conner MRN: [id number] DOB: 11-Nov-1955 Today's Date: 05/10/2024   History of Present Illness Pt is a 69 y/o female who presents s/p L4-S1 PLIF on 05/07/2024. PMH signfiicant for DM, HTN, hypothyroidism.    PT Comments  Pt progressing with post-op mobility. She was able to demonstrate transfers and ambulation with gross CGA and RW for support. Reinforced education on precautions, brace application/wearing schedule, appropriate activity progression, and car transfer. Pt declined further gait training this date. Pt and husband report no further questions at this time and anticipate d/c home this morning. Will continue to follow.      If plan is discharge home, recommend the following: A Tarter help with walking and/or transfers;A Mccormac help with bathing/dressing/bathroom;Assistance with cooking/housework;Assist for transportation;Help with stairs or ramp for entrance   Can travel by private vehicle        Equipment Recommendations  Rolling walker (2 wheels)    Recommendations for Other Services       Precautions / Restrictions Precautions Precautions: Back;Fall Precaution Booklet Issued: Yes (comment) Recall of Precautions/Restrictions: Impaired Precaution/Restrictions Comments: educated on back precautions with handout provided Required Braces or Orthoses: Spinal Brace Spinal Brace: Lumbar corset;Applied in sitting position Restrictions Weight Bearing Restrictions Per Provider Order: No     Mobility  Bed Mobility Overal bed mobility: Needs Assistance Bed Mobility: Sidelying to Sit, Rolling Rolling: Supervision Sidelying to sit: Supervision       General bed mobility comments: VC's for optimal log roll technique. Heavy use of rails required.    Transfers Overall transfer level: Needs assistance Equipment used: Rolling walker (2 wheels) Transfers: Sit to/from Stand Sit to Stand: Supervision, Contact guard assist            General transfer comment: VC's for hand placement on seated surface for safety.    Ambulation/Gait Ambulation/Gait assistance: Contact guard assist Gait Distance (Feet): 95 Feet Assistive device: Rolling walker (2 wheels) Gait Pattern/deviations: Decreased stride length, Shuffle, Trunk flexed, Narrow base of support Gait velocity: Decreased Gait velocity interpretation: <1.31 ft/sec, indicative of household ambulator   General Gait Details: Slow and guarded. Pt with difficulty clearing floor and was shuffling feet along. No overt LOB noted, however pt appears mildly unsteady throughout.   Stairs         General stair comments: Pt declined further stair training. PT issued gait belt for safety in prior session for use with entrance into home at d/c.   Wheelchair Mobility     Tilt Bed    Modified Rankin (Stroke Patients Only)       Balance Overall balance assessment: Needs assistance Sitting-balance support: Bilateral upper extremity supported, Feet supported Sitting balance-Leahy Scale: Good     Standing balance support: Reliant on assistive device for balance Standing balance-Leahy Scale: Fair                              Hotel Manager: No apparent difficulties  Cognition Arousal: Alert Behavior During Therapy: WFL for tasks assessed/performed   PT - Cognitive impairments: No apparent impairments                         Following commands: Intact      Cueing Cueing Techniques: Verbal cues  Exercises      General Comments        Pertinent Vitals/Pain Pain Assessment Pain Assessment: Faces Faces Pain Scale: Hurts  even more Pain Location: buttock area Pain Descriptors / Indicators: Dull, Operative site guarding Pain Intervention(s): Limited activity within patient's tolerance, Monitored during session, Repositioned    Home Living                          Prior Function             PT Goals (current goals can now be found in the care plan section) Acute Rehab PT Goals Patient Stated Goal: Feel better, be able to manage at home by sunday when daughter has to leave and she will be alone while husband works in evenings. PT Goal Formulation: With patient Time For Goal Achievement: 05/15/24 Potential to Achieve Goals: Good Progress towards PT goals: Progressing toward goals    Frequency    Min 5X/week      PT Plan      Co-evaluation              AM-PAC PT 6 Clicks Mobility   Outcome Measure  Help needed turning from your back to your side while in a flat bed without using bedrails?: A Marinaro Help needed moving from lying on your back to sitting on the side of a flat bed without using bedrails?: A Quinby Help needed moving to and from a bed to a chair (including a wheelchair)?: A Cerezo Help needed standing up from a chair using your arms (e.g., wheelchair or bedside chair)?: A Ritchie Help needed to walk in hospital room?: A Zangara Help needed climbing 3-5 steps with a railing? : A Campas 6 Click Score: 18    End of Session Equipment Utilized During Treatment: Gait belt;Back brace Activity Tolerance: Patient limited by fatigue;Patient limited by pain Patient left: in bed;with call bell/phone within reach;with family/visitor present Nurse Communication: Mobility status PT Visit Diagnosis: Unsteadiness on feet (R26.81);Pain Pain - part of body:  (back)     Time: 9059-9048 PT Time Calculation (min) (ACUTE ONLY): 11 min  Charges:    $Gait Training: 8-22 mins PT General Charges $$ ACUTE PT VISIT: 1 Visit                     Leita Sable, PT, DPT Acute Rehabilitation Services Secure Chat Preferred Office: [phone]    [name] 05/10/2024, 10:26 AM
